# Patient Record
Sex: Female | Born: 1955
Health system: Southern US, Community
[De-identification: ages and names within clinical notes are randomized; demographics above are authoritative.]

## PROBLEM LIST (undated history)

## (undated) DIAGNOSIS — I4891 Unspecified atrial fibrillation: Secondary | ICD-10-CM

## (undated) DIAGNOSIS — E669 Obesity, unspecified: Secondary | ICD-10-CM

## (undated) DIAGNOSIS — J449 Chronic obstructive pulmonary disease, unspecified: Secondary | ICD-10-CM

## (undated) DIAGNOSIS — F329 Major depressive disorder, single episode, unspecified: Secondary | ICD-10-CM

## (undated) DIAGNOSIS — F32A Depression, unspecified: Secondary | ICD-10-CM

## (undated) DIAGNOSIS — I10 Essential (primary) hypertension: Secondary | ICD-10-CM

## (undated) DIAGNOSIS — E785 Hyperlipidemia, unspecified: Secondary | ICD-10-CM

## (undated) DIAGNOSIS — G4700 Insomnia, unspecified: Secondary | ICD-10-CM

## (undated) DIAGNOSIS — F419 Anxiety disorder, unspecified: Secondary | ICD-10-CM

## (undated) DIAGNOSIS — R079 Chest pain, unspecified: Secondary | ICD-10-CM

## (undated) DIAGNOSIS — M1990 Unspecified osteoarthritis, unspecified site: Secondary | ICD-10-CM

## (undated) HISTORY — PX: CHOLECYSTECTOMY: SHX55

## (undated) HISTORY — DX: Major depressive disorder, single episode, unspecified: F32.9

## (undated) HISTORY — DX: Unspecified osteoarthritis, unspecified site: M19.90

## (undated) HISTORY — DX: Anxiety disorder, unspecified: F41.9

## (undated) HISTORY — DX: Essential (primary) hypertension: I10

## (undated) HISTORY — DX: Insomnia, unspecified: G47.00

## (undated) HISTORY — DX: Obesity, unspecified: E66.9

## (undated) HISTORY — PX: CARPAL TUNNEL RELEASE: SHX101

## (undated) HISTORY — DX: Depression, unspecified: F32.A

## (undated) HISTORY — DX: Chest pain, unspecified: R07.9

## (undated) HISTORY — DX: Hyperlipidemia, unspecified: E78.5

---

## 2002-05-19 ENCOUNTER — Other Ambulatory Visit: Admission: RE | Admit: 2002-05-19 | Discharge: 2002-05-19 | Payer: Self-pay | Admitting: *Deleted

## 2002-06-16 ENCOUNTER — Ambulatory Visit (HOSPITAL_BASED_OUTPATIENT_CLINIC_OR_DEPARTMENT_OTHER): Admission: RE | Admit: 2002-06-16 | Discharge: 2002-06-16 | Payer: Self-pay | Admitting: Family Medicine

## 2002-06-29 ENCOUNTER — Ambulatory Visit (HOSPITAL_COMMUNITY): Admission: RE | Admit: 2002-06-29 | Discharge: 2002-06-29 | Payer: Self-pay | Admitting: *Deleted

## 2002-07-01 ENCOUNTER — Ambulatory Visit (HOSPITAL_COMMUNITY): Admission: RE | Admit: 2002-07-01 | Discharge: 2002-07-02 | Payer: Self-pay | Admitting: Cardiology

## 2003-06-09 ENCOUNTER — Ambulatory Visit (HOSPITAL_COMMUNITY): Admission: RE | Admit: 2003-06-09 | Discharge: 2003-06-09 | Payer: Self-pay | Admitting: Family Medicine

## 2006-09-14 ENCOUNTER — Encounter (INDEPENDENT_AMBULATORY_CARE_PROVIDER_SITE_OTHER): Payer: Self-pay | Admitting: Family Medicine

## 2006-09-14 LAB — CONVERTED CEMR LAB
Cholesterol: 206 mg/dL
Triglycerides: 223 mg/dL

## 2006-09-18 ENCOUNTER — Encounter (INDEPENDENT_AMBULATORY_CARE_PROVIDER_SITE_OTHER): Payer: Self-pay | Admitting: Family Medicine

## 2006-09-18 LAB — CONVERTED CEMR LAB: Pap Smear: NORMAL

## 2006-09-19 ENCOUNTER — Ambulatory Visit (HOSPITAL_COMMUNITY): Admission: RE | Admit: 2006-09-19 | Discharge: 2006-09-19 | Payer: Self-pay | Admitting: Family Medicine

## 2007-02-25 ENCOUNTER — Encounter (INDEPENDENT_AMBULATORY_CARE_PROVIDER_SITE_OTHER): Payer: Self-pay | Admitting: Family Medicine

## 2007-03-30 ENCOUNTER — Ambulatory Visit: Payer: Self-pay | Admitting: Family Medicine

## 2007-03-30 DIAGNOSIS — E782 Mixed hyperlipidemia: Secondary | ICD-10-CM | POA: Insufficient documentation

## 2007-03-30 DIAGNOSIS — Z720 Tobacco use: Secondary | ICD-10-CM

## 2007-03-30 DIAGNOSIS — F411 Generalized anxiety disorder: Secondary | ICD-10-CM | POA: Insufficient documentation

## 2007-03-30 HISTORY — DX: Tobacco use: Z72.0

## 2007-04-02 DIAGNOSIS — F3289 Other specified depressive episodes: Secondary | ICD-10-CM

## 2007-04-02 DIAGNOSIS — I10 Essential (primary) hypertension: Secondary | ICD-10-CM | POA: Insufficient documentation

## 2007-04-02 DIAGNOSIS — F329 Major depressive disorder, single episode, unspecified: Secondary | ICD-10-CM

## 2007-04-02 DIAGNOSIS — M1990 Unspecified osteoarthritis, unspecified site: Secondary | ICD-10-CM | POA: Insufficient documentation

## 2007-04-02 DIAGNOSIS — J309 Allergic rhinitis, unspecified: Secondary | ICD-10-CM | POA: Insufficient documentation

## 2007-04-02 HISTORY — DX: Major depressive disorder, single episode, unspecified: F32.9

## 2007-04-02 HISTORY — DX: Other specified depressive episodes: F32.89

## 2007-04-22 ENCOUNTER — Encounter: Payer: Self-pay | Admitting: Family Medicine

## 2007-04-30 ENCOUNTER — Encounter: Payer: Self-pay | Admitting: Sports Medicine

## 2007-04-30 ENCOUNTER — Ambulatory Visit (HOSPITAL_COMMUNITY): Admission: RE | Admit: 2007-04-30 | Discharge: 2007-04-30 | Payer: Self-pay | Admitting: Sports Medicine

## 2007-05-11 ENCOUNTER — Encounter (INDEPENDENT_AMBULATORY_CARE_PROVIDER_SITE_OTHER): Payer: Self-pay | Admitting: Family Medicine

## 2007-05-18 ENCOUNTER — Ambulatory Visit: Payer: Self-pay | Admitting: Family Medicine

## 2007-05-18 DIAGNOSIS — F519 Sleep disorder not due to a substance or known physiological condition, unspecified: Secondary | ICD-10-CM | POA: Insufficient documentation

## 2007-05-22 ENCOUNTER — Encounter (INDEPENDENT_AMBULATORY_CARE_PROVIDER_SITE_OTHER): Payer: Self-pay | Admitting: Family Medicine

## 2007-05-22 ENCOUNTER — Ambulatory Visit: Payer: Self-pay | Admitting: Family Medicine

## 2007-05-22 LAB — CONVERTED CEMR LAB
BUN: 11 mg/dL (ref 6–23)
CO2: 25 meq/L (ref 19–32)
Calcium: 9.6 mg/dL (ref 8.4–10.5)
Creatinine, Ser: 0.49 mg/dL (ref 0.40–1.20)
HDL: 36 mg/dL — ABNORMAL LOW (ref >39)

## 2007-06-01 ENCOUNTER — Encounter (INDEPENDENT_AMBULATORY_CARE_PROVIDER_SITE_OTHER): Payer: Self-pay | Admitting: Family Medicine

## 2007-09-02 ENCOUNTER — Encounter: Payer: Self-pay | Admitting: *Deleted

## 2007-10-08 ENCOUNTER — Encounter: Admission: RE | Admit: 2007-10-08 | Discharge: 2007-10-08 | Payer: Self-pay | Admitting: Family Medicine

## 2007-10-23 ENCOUNTER — Encounter: Payer: Self-pay | Admitting: Family Medicine

## 2007-10-23 ENCOUNTER — Ambulatory Visit: Payer: Self-pay | Admitting: Family Medicine

## 2007-10-28 ENCOUNTER — Encounter (INDEPENDENT_AMBULATORY_CARE_PROVIDER_SITE_OTHER): Payer: Self-pay | Admitting: Family Medicine

## 2007-10-30 ENCOUNTER — Encounter (INDEPENDENT_AMBULATORY_CARE_PROVIDER_SITE_OTHER): Payer: Self-pay | Admitting: Family Medicine

## 2007-10-30 ENCOUNTER — Ambulatory Visit: Payer: Self-pay | Admitting: Family Medicine

## 2007-11-09 LAB — CONVERTED CEMR LAB
Cholesterol: 189 mg/dL (ref 0–200)
HDL: 36 mg/dL — ABNORMAL LOW (ref >39)
Total CHOL/HDL Ratio: 5.3
Triglycerides: 293 mg/dL — ABNORMAL HIGH (ref <150)
VLDL: 59 mg/dL — ABNORMAL HIGH (ref 0–40)

## 2007-11-18 ENCOUNTER — Ambulatory Visit: Payer: Self-pay | Admitting: Family Medicine

## 2007-11-26 ENCOUNTER — Ambulatory Visit: Payer: Self-pay | Admitting: Family Medicine

## 2007-11-26 ENCOUNTER — Telehealth: Payer: Self-pay | Admitting: *Deleted

## 2007-11-26 ENCOUNTER — Encounter: Payer: Self-pay | Admitting: Family Medicine

## 2007-12-02 ENCOUNTER — Ambulatory Visit: Payer: Self-pay | Admitting: Family Medicine

## 2007-12-02 ENCOUNTER — Telehealth: Payer: Self-pay | Admitting: *Deleted

## 2007-12-02 ENCOUNTER — Encounter: Admission: RE | Admit: 2007-12-02 | Discharge: 2007-12-02 | Payer: Self-pay | Admitting: Family Medicine

## 2007-12-02 ENCOUNTER — Encounter: Payer: Self-pay | Admitting: Family Medicine

## 2007-12-03 ENCOUNTER — Telehealth (INDEPENDENT_AMBULATORY_CARE_PROVIDER_SITE_OTHER): Payer: Self-pay | Admitting: *Deleted

## 2007-12-04 ENCOUNTER — Ambulatory Visit: Payer: Self-pay | Admitting: Family Medicine

## 2007-12-09 ENCOUNTER — Encounter: Admission: RE | Admit: 2007-12-09 | Discharge: 2008-01-28 | Payer: Self-pay | Admitting: Family Medicine

## 2007-12-22 ENCOUNTER — Telehealth (INDEPENDENT_AMBULATORY_CARE_PROVIDER_SITE_OTHER): Payer: Self-pay | Admitting: Family Medicine

## 2008-01-11 ENCOUNTER — Ambulatory Visit: Payer: Self-pay | Admitting: Family Medicine

## 2008-01-12 ENCOUNTER — Telehealth: Payer: Self-pay | Admitting: Psychology

## 2008-01-28 ENCOUNTER — Encounter (INDEPENDENT_AMBULATORY_CARE_PROVIDER_SITE_OTHER): Payer: Self-pay | Admitting: Family Medicine

## 2008-02-05 ENCOUNTER — Ambulatory Visit: Payer: Self-pay | Admitting: Family Medicine

## 2008-02-05 ENCOUNTER — Ambulatory Visit: Payer: Self-pay | Admitting: Sports Medicine

## 2008-02-05 ENCOUNTER — Encounter (INDEPENDENT_AMBULATORY_CARE_PROVIDER_SITE_OTHER): Payer: Self-pay | Admitting: Family Medicine

## 2008-02-08 LAB — CONVERTED CEMR LAB
LDL Cholesterol: 102 mg/dL — ABNORMAL HIGH (ref 0–99)
VLDL: 37 mg/dL (ref 0–40)

## 2008-03-04 ENCOUNTER — Ambulatory Visit: Payer: Self-pay | Admitting: Sports Medicine

## 2008-04-07 ENCOUNTER — Telehealth (INDEPENDENT_AMBULATORY_CARE_PROVIDER_SITE_OTHER): Payer: Self-pay | Admitting: Family Medicine

## 2008-06-16 ENCOUNTER — Telehealth: Payer: Self-pay | Admitting: Family Medicine

## 2008-06-16 ENCOUNTER — Ambulatory Visit: Payer: Self-pay | Admitting: Family Medicine

## 2008-06-16 ENCOUNTER — Ambulatory Visit (HOSPITAL_COMMUNITY): Admission: RE | Admit: 2008-06-16 | Discharge: 2008-06-16 | Payer: Self-pay | Admitting: Family Medicine

## 2008-06-16 ENCOUNTER — Encounter: Payer: Self-pay | Admitting: Family Medicine

## 2008-06-16 ENCOUNTER — Telehealth (INDEPENDENT_AMBULATORY_CARE_PROVIDER_SITE_OTHER): Payer: Self-pay | Admitting: Family Medicine

## 2008-06-17 ENCOUNTER — Ambulatory Visit: Payer: Self-pay | Admitting: Family Medicine

## 2008-06-17 LAB — CONVERTED CEMR LAB
BUN: 10 mg/dL (ref 6–23)
Chloride: 103 meq/L (ref 96–112)
Eosinophils Relative: 2 % (ref 0–5)
Glucose, Bld: 94 mg/dL (ref 70–99)
HCT: 45.4 % (ref 36.0–46.0)
Hemoglobin: 15.6 g/dL — ABNORMAL HIGH (ref 12.0–15.0)
Lymphocytes Relative: 28 % (ref 12–46)
Lymphs Abs: 2.3 10*3/uL (ref 0.7–4.0)
Monocytes Absolute: 0.7 10*3/uL (ref 0.1–1.0)
Neutro Abs: 5 10*3/uL (ref 1.7–7.7)
Potassium: 3.7 meq/L (ref 3.5–5.3)
RBC: 4.95 M/uL (ref 3.87–5.11)
Sodium: 142 meq/L (ref 135–145)
TSH: 0.432 microintl units/mL (ref 0.350–4.500)
WBC: 8.2 10*3/uL (ref 4.0–10.5)

## 2008-07-27 ENCOUNTER — Telehealth: Payer: Self-pay | Admitting: *Deleted

## 2008-09-09 ENCOUNTER — Encounter (INDEPENDENT_AMBULATORY_CARE_PROVIDER_SITE_OTHER): Payer: Self-pay | Admitting: Family Medicine

## 2008-11-23 ENCOUNTER — Ambulatory Visit: Payer: Self-pay | Admitting: Family Medicine

## 2008-11-23 ENCOUNTER — Encounter: Payer: Self-pay | Admitting: Family Medicine

## 2008-11-24 ENCOUNTER — Encounter: Payer: Self-pay | Admitting: Family Medicine

## 2008-12-07 ENCOUNTER — Ambulatory Visit: Payer: Self-pay | Admitting: Family Medicine

## 2008-12-07 ENCOUNTER — Encounter: Payer: Self-pay | Admitting: Family Medicine

## 2008-12-07 LAB — CONVERTED CEMR LAB
Cholesterol: 233 mg/dL — ABNORMAL HIGH (ref 0–200)
HDL: 38 mg/dL — ABNORMAL LOW (ref >39)
Total CHOL/HDL Ratio: 6.1
VLDL: 54 mg/dL — ABNORMAL HIGH (ref 0–40)

## 2008-12-08 ENCOUNTER — Encounter: Payer: Self-pay | Admitting: Family Medicine

## 2009-02-20 ENCOUNTER — Telehealth: Payer: Self-pay | Admitting: Family Medicine

## 2009-03-02 ENCOUNTER — Ambulatory Visit: Payer: Self-pay | Admitting: Family Medicine

## 2009-03-16 ENCOUNTER — Encounter: Payer: Self-pay | Admitting: Family Medicine

## 2009-03-29 ENCOUNTER — Encounter: Payer: Self-pay | Admitting: Family Medicine

## 2009-05-28 ENCOUNTER — Emergency Department (HOSPITAL_COMMUNITY): Admission: EM | Admit: 2009-05-28 | Discharge: 2009-05-28 | Payer: Self-pay | Admitting: Family Medicine

## 2009-07-21 ENCOUNTER — Telehealth: Payer: Self-pay | Admitting: Family Medicine

## 2009-07-26 ENCOUNTER — Ambulatory Visit: Payer: Self-pay | Admitting: Family Medicine

## 2009-07-26 DIAGNOSIS — E669 Obesity, unspecified: Secondary | ICD-10-CM | POA: Insufficient documentation

## 2009-07-26 LAB — CONVERTED CEMR LAB
Creatinine,U: 50 mg/dL
Microalbumin U total vol: 30 mg/L

## 2009-07-27 ENCOUNTER — Encounter: Payer: Self-pay | Admitting: Family Medicine

## 2009-07-27 ENCOUNTER — Ambulatory Visit: Payer: Self-pay | Admitting: Family Medicine

## 2009-07-28 ENCOUNTER — Encounter: Payer: Self-pay | Admitting: Family Medicine

## 2009-07-28 LAB — CONVERTED CEMR LAB
AST: 14 units/L (ref 0–37)
Alkaline Phosphatase: 62 units/L (ref 39–117)
BUN: 16 mg/dL (ref 6–23)
Glucose, Bld: 93 mg/dL (ref 70–99)
HDL: 44 mg/dL (ref >39)
Hemoglobin: 14.4 g/dL (ref 12.0–15.0)
LDL Cholesterol: 133 mg/dL — ABNORMAL HIGH (ref 0–99)
MCHC: 33.2 g/dL (ref 30.0–36.0)
MCV: 93.7 fL (ref 78.0–100.0)
Potassium: 3.9 meq/L (ref 3.5–5.3)
RBC: 4.63 M/uL (ref 3.87–5.11)
RDW: 13.8 % (ref 11.5–15.5)
Total Bilirubin: 0.8 mg/dL (ref 0.3–1.2)
Total CHOL/HDL Ratio: 4.8
Triglycerides: 180 mg/dL — ABNORMAL HIGH (ref <150)
VLDL: 36 mg/dL (ref 0–40)

## 2009-11-15 ENCOUNTER — Ambulatory Visit: Payer: Self-pay | Admitting: Family Medicine

## 2009-11-20 ENCOUNTER — Telehealth: Payer: Self-pay | Admitting: Family Medicine

## 2010-01-12 ENCOUNTER — Encounter: Admission: RE | Admit: 2010-01-12 | Discharge: 2010-01-12 | Payer: Self-pay | Admitting: Family Medicine

## 2010-01-16 ENCOUNTER — Ambulatory Visit: Payer: Self-pay | Admitting: Family Medicine

## 2010-01-16 ENCOUNTER — Encounter: Payer: Self-pay | Admitting: Family Medicine

## 2010-02-27 ENCOUNTER — Encounter: Payer: Self-pay | Admitting: Family Medicine

## 2010-02-27 ENCOUNTER — Ambulatory Visit: Payer: Self-pay | Admitting: Family Medicine

## 2010-02-28 ENCOUNTER — Telehealth: Payer: Self-pay | Admitting: *Deleted

## 2010-02-28 LAB — CONVERTED CEMR LAB: Direct LDL: 149 mg/dL — ABNORMAL HIGH

## 2010-03-20 ENCOUNTER — Encounter: Payer: Self-pay | Admitting: Family Medicine

## 2010-04-24 NOTE — Assessment & Plan Note (Signed)
Summary: leg & foot pain,tcb   Vital Signs:  Patient profile:   55 year old female Height:      60.25 inches Weight:      183.5 pounds BMI:     35.67 Pulse rate:   67 / minute BP sitting:   130 / 80  (left arm) Cuff size:   large  Vitals Entered By: Arlyss Repress CMA, (November 15, 2009 10:30 AM) CC: chronic bilateral knee ... getting worse x several months.  Is Patient Diabetic? No Pain Assessment Patient in pain? yes     Location: knees Intensity: 6 Onset of pain  Chronic   Primary Care Provider:  Milinda Antis MD  CC:  chronic bilateral knee ... getting worse x several months. .  History of Present Illness:     Bilateral knee pain and feet pain- worse after long hours 5-6 hours, using insoles prior feel like the didn't help as much, has new shoes at home which hurt after wearing x 1 day now with knee pain, no swelling, no locking, occ giving out , hands hurt as well after working - known arrthritis in hands Rest relieves pain Tried massage, taking Ibuprofen 600mg   helps take the edge off    Exercise- Swimming, walks the dog  Feels she was better when she was smoking       Habits & Providers  Alcohol-Tobacco-Diet     Tobacco Status: quit < 6 months     Tobacco Counseling: to remain off tobacco products  Current Medications (verified): 1)  Hydrochlorothiazide 25 Mg  Tabs (Hydrochlorothiazide) .... Take 1 Tab By Mouth Every Morning 2)  Fish Oil 1000 Mg Caps (Omega-3 Fatty Acids) .... 4 By Mouth Daily 3)  Ambien 5 Mg Tabs (Zolpidem Tartrate) .Marland Kitchen.. 1 By Mouth At Bedtime As Needed Insomnia 4)  Zyrtec Allergy 10 Mg Tabs (Cetirizine Hcl) .Marland Kitchen.. 1 By Mouth Daily As Needed Allergies 5)  Glucosamine 500 Mg Caps (Glucosamine Sulfate) .... 2 Tabs A Day 6)  Aspir-Low 81 Mg Tbec (Aspirin) .Marland Kitchen.. 1 By Mouth Daily 7)  Multivitamins  Caps (Multiple Vitamin) .Marland Kitchen.. 1 Tab Daily 8)  Ibuprofen 600 Mg Tabs (Ibuprofen) .Marland Kitchen.. 1 By Mouth Q 6hrs With Food, As Needed Pain  Allergies  (verified): No Known Drug Allergies  Social History: Smoking Status:  quit < 6 months  Physical Exam  General:  NAD, Vital signs noted  Msk:  Bilat feet  No visible erythema or swelling. Range of motion is full in all directions. Strength is 5/5 in all directions. Stable lateral and medial ligaments; squeeze test  unremarkable; Talar dome nontender; No tenderness on posterior aspects of lateral and medial malleolus TTP over bialt soles of feet     Bilat knee- no effusion noted, no erythema, NT to palpation, ligamients intact, neg lachmans and anterior drawer, patellar non tender, strength equal bilat   Impression & Recommendations:  Problem # 1:  OSTEOARTHRITIS (ICD-715.90) Assessment Unchanged  Knee pain likley secondary to early arthritis and referred pain from feet. Continue NSAIDS as needed, no imaging needed today insoles for feet, exercises with tennis ball , elevation after work Her updated medication list for this problem includes:    Aspir-low 81 Mg Tbec (Aspirin) .Marland Kitchen... 1 by mouth daily    Ibuprofen 600 Mg Tabs (Ibuprofen) .Marland Kitchen... 1 by mouth q 6hrs with food, as needed pain  Orders: [facility]- Est Level  3 (04540)  Problem # 2:  FOOT PAIN, BILATERAL (ICD-729.5) Assessment: New  no red flags, ?  plantar fascitis, vs poor arch support, reccomded SM for new pair of orthotics or insoles  Orders: [facility]- Est Level  3 (02725)  Complete Medication List: 1)  Hydrochlorothiazide 25 Mg Tabs (Hydrochlorothiazide) .... Take 1 tab by mouth every morning 2)  Fish Oil 1000 Mg Caps (Omega-3 fatty acids) .... 4 by mouth daily 3)  Ambien 5 Mg Tabs (Zolpidem tartrate) .Marland Kitchen.. 1 by mouth at bedtime as needed insomnia 4)  Zyrtec Allergy 10 Mg Tabs (Cetirizine hcl) .Marland Kitchen.. 1 by mouth daily as needed allergies 5)  Glucosamine 500 Mg Caps (Glucosamine sulfate) .... 2 tabs a day 6)  Aspir-low 81 Mg Tbec (Aspirin) .Marland Kitchen.. 1 by mouth daily 7)  Multivitamins Caps (Multiple vitamin) .Marland Kitchen.. 1 tab daily 8)   Ibuprofen 600 Mg Tabs (Ibuprofen) .Marland Kitchen.. 1 by mouth q 6hrs with food, as needed pain  Patient Instructions: 1)  I recommend getting some insoles for your shoes 2)  Use at tennis ball and roll under your feet 3)  Continue ibuprofen for now for your knees 4)  If your knees get worse,pelase let me know  5)  Follow-up in 2 months for physical Prescriptions: IBUPROFEN 600 MG TABS (IBUPROFEN) 1 by mouth q 6hrs with food, as needed pain  #60 x 0   Entered and Authorized by:   Milinda Antis MD   Signed by:   Milinda Antis MD on 11/15/2009   Method used:   Electronically to        [facility] [phone]* (retail)       [address]       [place], [place]  [address]       Ph: [phone]       Fax: [phone]   RxID:   [id number]

## 2010-04-24 NOTE — Progress Notes (Signed)
Summary: RX  Phone Note Refill Request Call back at Home Phone [phone]   Refills Requested: Medication #1:  AMBIEN 5 MG TABS 1 by mouth at bedtime as needed insomnia pt goes to [facility]  Initial call taken by: Knox Royalty,  July 21, 2009 10:42 AM    Prescriptions: AMBIEN 5 MG TABS (ZOLPIDEM TARTRATE) 1 by mouth at bedtime as needed insomnia  #30 x 1   Entered and Authorized by:   Milinda Antis MD   Signed by:   Milinda Antis MD on 07/21/2009   Method used:   Telephoned to ...       [facility] [address] [phone]* (retail)       [address]       [place], [place]  [address]       Ph: [phone]       Fax: [phone]   RxID:   [id number]

## 2010-04-24 NOTE — Assessment & Plan Note (Signed)
Summary: cpe,df   Vital Signs:  Patient profile:   55 year old female Height:      60 inches Weight:      188.6 pounds BMI:     36.97 Pulse rate:   72 / minute BP sitting:   132 / 82  (left arm) Cuff size:   regular  Vitals Entered By: Arlyss Repress CMA, (January 16, 2010 3:22 PM) CC: physical./ f/u meds Is Patient Diabetic? No Pain Assessment Patient in pain? no        Primary Care Provider:  Milinda Antis MD  CC:  physical./ f/u meds.  History of Present Illness:   Pain- Continues to have pain in legs and feet after working long shits, now using Dr.Scholl , not taking Tylenol or ibuprofen, keeps them with her for pain relief  Now has a new dog she rescued so walking more , no falling , no legs weakness    HTN- toleating BP meds, no CP, no HA  Insomnia-- using Ambien every night, tried to to not use it but needs, TV and radio off, tries to get to bed same time each night, tried relaxation techniques , CD's but relies on meds  Prevention- Need Colonscooy report , given the previous physician offfice number PAP smear normal 2008, 2009, 2010 next one due in 2012  Mammogram normal    Quit tobacco in April 2011  Habits & Providers  Alcohol-Tobacco-Diet     Tobacco Status: quit > 6 months     Tobacco Counseling: to remain off tobacco products  Current Medications (verified): 1)  Hydrochlorothiazide 25 Mg  Tabs (Hydrochlorothiazide) .... Take 1 Tab By Mouth Every Morning 2)  Fish Oil 1000 Mg Caps (Omega-3 Fatty Acids) .... 4 By Mouth Daily 3)  Ambien 5 Mg Tabs (Zolpidem Tartrate) .Marland Kitchen.. 1 By Mouth At Bedtime As Needed Insomnia 4)  Zyrtec Allergy 10 Mg Tabs (Cetirizine Hcl) .Marland Kitchen.. 1 By Mouth Daily As Needed Allergies 5)  Glucosamine 500 Mg Caps (Glucosamine Sulfate) .... 2 Tabs A Day 6)  Aspir-Low 81 Mg Tbec (Aspirin) .Marland Kitchen.. 1 By Mouth Daily 7)  Multivitamins  Caps (Multiple Vitamin) .Marland Kitchen.. 1 Tab Daily 8)  Ibuprofen 600 Mg Tabs (Ibuprofen) .Marland Kitchen.. 1 By Mouth Q 6hrs With Food,  As Needed Pain  Allergies (verified): No Known Drug Allergies  Past History:  Past Medical History: Allergic rhinitis Anxiety Depression Hypertension Osteoarthritis sleep apnea Quit tobacco in April 2011  Social History: Smoking Status:  quit > 6 months  Review of Systems       Per HPI  Physical Exam  General:  NAD, Vital signs noted  Eyes:  Wears glasses , PERRL, EOMI, fundoscopic exam difficult, no papilledema  Lungs:  CTAB Heart:  RRR,NO murmur Pulses:  2+ DP,radial Extremities:  No edema   Impression & Recommendations:  Problem # 1:  HYPERTENSION (ICD-401.9) Assessment Unchanged  no change to meds, next labs in May Her updated medication list for this problem includes:    Hydrochlorothiazide 25 Mg Tabs (Hydrochlorothiazide) .Marland Kitchen... Take 1 tab by mouth every morning  Orders: [facility]- Est  Level 4 ([address])  Problem # 2:  FOOT PAIN, BILATERAL (ICD-729.5) Assessment: Improved  Improved with inserts, pain in legs and feet likley from overuse, may have some early arthritis Tylenol as needed has been evaluate for this previous, encouraged water aerobics  Orders: [facility]- Est  Level 4 ([address])  Problem # 3:  INSOMNIA-SLEEP DISORDER-UNSPEC (ICD-307.40) Assessment: Unchanged  Discussed possible change to Trazodone, doing well  on Ambien low dose. Continue   Orders: [facility]- Est  Level 4 ([address])  Problem # 4:  HYPERLIPIDEMIA (ICD-272.2) Assessment: Unchanged  Recheck in 1-2 months Fish oil Discussed may need Statin  Orders: [facility]- Est  Level 4 ([address])  Problem # 5:  TOBACCO USE, QUIT (ICD-V15.82) Assessment: Improved  Orders: [facility]- Est  Level 4 ([address]11)  Problem # 6:  Preventive Health Care (ICD-V70.0) Screening UTD , mammogram, neg PAP reheck in 2012 Colonoscopy records sent Flu shot given  Complete Medication List: 1)  Hydrochlorothiazide 25 Mg Tabs (Hydrochlorothiazide) .... Take 1 tab by mouth every morning 2)  Fish Oil 1000 Mg Caps (Omega-3 fatty acids)  .... 4 by mouth daily 3)  Ambien 5 Mg Tabs (Zolpidem tartrate) .Marland Kitchen.. 1 by mouth at bedtime as needed insomnia 4)  Zyrtec Allergy 10 Mg Tabs (Cetirizine hcl) .Marland Kitchen.. 1 by mouth daily as needed allergies 5)  Glucosamine 500 Mg Caps (Glucosamine sulfate) .... 2 tabs a day 6)  Aspir-low 81 Mg Tbec (Aspirin) .Marland Kitchen.. 1 by mouth daily 7)  Multivitamins Caps (Multiple vitamin) .Marland Kitchen.. 1 tab daily 8)  Ibuprofen 600 Mg Tabs (Ibuprofen) .Marland Kitchen.. 1 by mouth q 6hrs with food, as needed pain  Other Orders: Influenza Vaccine NON MCR (91478)  Patient Instructions: 1)  Water aerboics would be great for your joints 2)  Taking the acetaminophen  3)  Make an appt with the Lab in Nov/Dec do not eat afternight - To check your cholesterol  4)  Next visit in April/May for your PAP Smear and yearly labs  Prescriptions: HYDROCHLOROTHIAZIDE 25 MG  TABS (HYDROCHLOROTHIAZIDE) Take 1 tab by mouth every morning  #30.0 Each x 6   Entered and Authorized by:   Milinda Antis MD   Signed by:   Milinda Antis MD on 01/16/2010   Method used:   Electronically to        [facility] [phone]* (retail)       [address]       [place], [place]  [address]       Ph: [phone]       Fax: [phone]   RxID:   [id number] AMBIEN 5 MG TABS (ZOLPIDEM TARTRATE) 1 by mouth at bedtime as needed insomnia  #30 x 2   Entered and Authorized by:   Milinda Antis MD   Signed by:   Milinda Antis MD on 01/16/2010   Method used:   Handwritten   RxID:   [id number]    Orders Added: 1)  Influenza Vaccine NON MCR [00028] 2)  [facility]- Est  Level 4 [18841]   Immunizations Administered:  Influenza Vaccine # 1:    Vaccine Type: Fluvax Non-MCR    Site: left deltoid    Mfr: GlaxoSmithKline    Dose: 0.5 ml    Route: IM    Given by: Tessie Fass CMA    Exp. Date: 09/19/2010    Lot #: [id number]    VIS given: 10/17/09 version given January 16, 2010.  Flu Vaccine Consent Questions:    Do you have a history of severe allergic  reactions to this vaccine? no    Any prior history of allergic reactions to egg and/or gelatin? no    Do you have a sensitivity to the preservative Thimersol? no    Do you have a past history of Guillan-Barre Syndrome? no    Do you currently have an acute febrile illness? no    Have you ever had a severe reaction to latex? no  Vaccine information given and explained to patient? yes    Are you currently pregnant? no   Immunizations Administered:  Influenza Vaccine # 1:    Vaccine Type: Fluvax Non-MCR    Site: left deltoid    Mfr: GlaxoSmithKline    Dose: 0.5 ml    Route: IM    Given by: Tessie Fass CMA    Exp. Date: 09/19/2010    Lot #: [id number]    VIS given: 10/17/09 version given January 16, 2010.    Prevention & Chronic Care Immunizations   Influenza vaccine: Fluvax Non-MCR  (01/16/2010)   Influenza vaccine due: 03/02/2010    Tetanus booster: Not documented    Pneumococcal vaccine: Not documented  Colorectal Screening   Hemoccult: Not documented   Hemoccult action/deferral: Ordered  (11/23/2008)   Hemoccult due: Not Indicated    Colonoscopy: Not documented  Other Screening   Pap smear: NEGATIVE FOR INTRAEPITHELIAL LESIONS OR MALIGNANCY.  (11/23/2008)   Pap smear due: 07/27/2010    Mammogram: ASSESSMENT: Negative - BI-RADS 1^MM DIGITAL SCREENING  (01/12/2010)   Mammogram due: 01/13/2011   Smoking status: quit > 6 months  (01/16/2010)  Lipids   Total Cholesterol: 213  (07/27/2009)   LDL: 133  (07/27/2009)   LDL Direct: Not documented   HDL: 44  (07/27/2009)   Triglycerides: 180  (07/27/2009)    SGOT (AST): 14  (07/27/2009)   SGPT (ALT): 16  (07/27/2009)   Alkaline phosphatase: 62  (07/27/2009)   Total bilirubin: 0.8  (07/27/2009)    Lipid flowsheet reviewed?: Yes   Progress toward LDL goal: Unchanged  Hypertension   Last Blood Pressure: 132 / 82  (01/16/2010)   Serum creatinine: 0.57  (07/27/2009)   Serum potassium 3.9  (07/27/2009)     Hypertension flowsheet reviewed?: Yes   Progress toward BP goal: At goal  Self-Management Support :   Personal Goals (by the next clinic visit) :      Personal blood pressure goal: 140/90  (01/16/2010)     Personal LDL goal: 130  (01/16/2010)    Hypertension self-management support: Not documented    Lipid self-management support: Not documented     Flex Sig Next Due:  Not Indicated Hemoccult Next Due:  Not Indicated Last Mammogram:  ASSESSMENT: Negative - BI-RADS 1^MM DIGITAL SCREENING (01/12/2010 12:10:00 PM) Mammogram Next Due:  1 yr

## 2010-04-24 NOTE — Consult Note (Signed)
Summary: [facility]  [facility]   Imported By: Clydell Hakim 04/06/2009 11:47:15  _____________________________________________________________________  External Attachment:    Type:   Image     Comment:   External Document

## 2010-04-24 NOTE — Miscellaneous (Signed)
Summary: Orders Update  Clinical Lists Changes  Orders: Added new Test order of Lipid-[facility] (80061-22930) - Signed 

## 2010-04-24 NOTE — Assessment & Plan Note (Signed)
Summary: f/u visit/bmc   Vital Signs:  Patient profile:   55 year old female Height:      60.25 inches Weight:      176 pounds BMI:     34.21 Temp:     98.7 degrees F oral Pulse rate:   60 / minute BP sitting:   107 / 72  (left arm) Cuff size:   large  Vitals Entered By: Tessie Fass CMA (Jul 26, 2009 11:41 AM) CC: F/U dizzy, HTN, left side pain Is Patient Diabetic? No Pain Assessment Patient in pain? no          Last PAP Date 07/26/2009 Last PAP Result normal   Primary Care Provider:  Milinda Antis MD  CC:  F/U dizzy, HTN, and left side pain.  History of Present Illness:   1. Dizziness- Feels dizzy on and off for past 6 months, occurs mostly at work, such as when she is looking around at work or multitasking, no LOC ,. Noticed first epsidoe  6 months  ago, denies vertigo or room spinning, feels more like she is on a boat. No OTC meds taken in past or recently.  No associated headache, seen by optometrist last Year no change in prescription Pt does feel like it is worse since she  quit smoking  ROS- no chest pain, SOB, + Nausea no emesis, no illness during episodes   2. Left side pain-  admits to cramping pain in LLQ  every now and then that last a few days then stops. Pain is intermittant during the days it comes, non radiating no aggravating or alleviating factors. No recent epsiode. Denies fever, vaginal bleeding, discharge, dysuria or constipation with episodes  Pt also asked about her weight loss, down 12lbs since Sep 2010, with no specific changes  Had colonscopy, PAP smear, no mammogram done  3. HTN- tolerating medications ,no side effects, no home bp monitoring    Habits & Providers  Alcohol-Tobacco-Diet     Tobacco Status: quit  Current Medications (verified): 1)  Hydrochlorothiazide 25 Mg  Tabs (Hydrochlorothiazide) .... Take 1 Tab By Mouth Every Morning 2)  Fish Oil 1000 Mg Caps (Omega-3 Fatty Acids) .... 4 By Mouth Daily 3)  Ambien 5 Mg Tabs  (Zolpidem Tartrate) .Marland Kitchen.. 1 By Mouth At Bedtime As Needed Insomnia 4)  Zyrtec Allergy 10 Mg Tabs (Cetirizine Hcl) .Marland Kitchen.. 1 By Mouth Daily As Needed Allergies 5)  Glucosamine 500 Mg Caps (Glucosamine Sulfate) .... 2 Tabs A Day 6)  Aspir-Low 81 Mg Tbec (Aspirin) .Marland Kitchen.. 1 By Mouth Daily 7)  Multivitamins  Caps (Multiple Vitamin) .Marland Kitchen.. 1 Tab Daily 8)  Antivert 25 Mg Tabs (Meclizine Hcl) .... Take 1 Tab Two Times A Day As Needed Dizziness  Allergies (verified): No Known Drug Allergies  Social History: Smoking Status:  quit  Physical Exam  General:  NAD, Vital signs noted Able to evoke dizziness and off balance by laying pt backward and moving her right and left, lasted approx 30 sec Eyes:  Wears glasses , PERRL, EOMI Ears:  External ear exam shows no significant lesions or deformities.  Otoscopic examination reveals clear canals, tympanic membranes are intact bilaterally without bulging, retraction, inflammation or discharge.  Mouth:  Oral mucosa and oropharynx without lesions or exudates.   Neck:  Supple, no masses felt No carotid Bruit Lungs:  CTAB Heart:  RRR, HR 60's, no murmur Abdomen:  soft, non-tender, normal bowel sounds, no distention, and no masses.   Pulses:  2+ DP, and  PT, Radial Extremities:  No edema Neurologic:  Strength equal bilat, CN II-Xii in tact, no focal deficits   Impression & Recommendations:  Problem # 1:  DIZZINESS (ICD-780.4) Assessment New  No evidence of infection, vascular disease in carotids, arrythmia, no new meds, bp on low normal but prior BP in the  120's on same dose of HCTZ. Will send for vestibular training, meclizine prn Her updated medication list for this problem includes:    Zyrtec Allergy 10 Mg Tabs (Cetirizine hcl) .Marland Kitchen... 1 by mouth daily as needed allergies    Antivert 25 Mg Tabs (Meclizine hcl) .Marland Kitchen... Take 1 tab two times a day as needed dizziness  Orders: [facility]- Est  Level 4 ([address])  Problem # 2:  ABDOMINAL PAIN, LEFT LOWER QUADRANT  (ICD-789.04) Assessment: New  No red flags, pt menopausal , no change in bowels during episodes and no specific pattern will monitor for now.  Orders: [facility]- Est  Level 4 ([address]14[address])  Problem # 3:  WEIGHT LOSS (ICD-783.21) Assessment: New  Unprovoked weight loss, will obtain Colonscopy report, other cancer screening to be done, such as Mammogram, PAP smear neg. If persistant CXR at next visit, labs to be done in AM , CMET, CBC, FLP, A1C  Orders: [facility]- Est  Level 4 ([address])  Problem # 4:  HYPERTENSION (ICD-401.9) Assessment: Unchanged  Her updated medication list for this problem includes:    Hydrochlorothiazide 25 Mg Tabs (Hydrochlorothiazide) .Marland Kitchen... Take 1 tab by mouth every morning  Orders: UA Microalbumin-[facility] (29562)  Complete Medication List: 1)  Hydrochlorothiazide 25 Mg Tabs (Hydrochlorothiazide) .... Take 1 tab by mouth every morning 2)  Fish Oil 1000 Mg Caps (Omega-3 fatty acids) .... 4 by mouth daily 3)  Ambien 5 Mg Tabs (Zolpidem tartrate) .Marland Kitchen.. 1 by mouth at bedtime as needed insomnia 4)  Zyrtec Allergy 10 Mg Tabs (Cetirizine hcl) .Marland Kitchen.. 1 by mouth daily as needed allergies 5)  Glucosamine 500 Mg Caps (Glucosamine sulfate) .... 2 tabs a day 6)  Aspir-low 81 Mg Tbec (Aspirin) .Marland Kitchen.. 1 by mouth daily 7)  Multivitamins Caps (Multiple vitamin) .Marland Kitchen.. 1 tab daily 8)  Antivert 25 Mg Tabs (Meclizine hcl) .... Take 1 tab two times a day as needed dizziness  Other Orders: Future Orders: Comp Met-[facility] ([id number]) ... 07/27/2010 CBC-[facility] (96295) ... 07/27/2010 Lipid-[facility] ([id number]) ... 08/10/2010 A1C-[facility] (02725) ... 08/03/2010  Patient Instructions: 1)  Please return to have your labs drawn 2)  Try the meclizine for dizziness 3)  I will send you to Vestibular Physical therapy to help your dizziness 4)  Congratulations on the smoking 5)  Please call me with the name of the practice where you received your colonoscopy 6)  please schedule your  mammogram Prescriptions: HYDROCHLOROTHIAZIDE 25 MG  TABS (HYDROCHLOROTHIAZIDE) Take 1 tab by mouth every morning  #30.0 Each x 3   Entered and Authorized by:   Milinda Antis MD   Signed by:   Milinda Antis MD on 07/26/2009   Method used:   Electronically to        [facility] [phone]* (retail)       [address]       [place], [place]  [address]       Ph: [phone]       Fax: [phone]   RxID:   [id number] ANTIVERT 25 MG TABS (MECLIZINE HCL) tAKE 1 TAB two times a day as needed dizziness  #60 x 1   Entered and Authorized by:   Milinda Antis MD  Signed by:   Milinda Antis MD on 07/26/2009   Method used:   Electronically to        [facility] [phone]* (retail)       [address]       [place], [place]  [address]       Ph: [phone]       Fax: [phone]   RxID:   [id number]   Laboratory Results   Urine Tests  Date/Time Received: Jul 26, 2009 11:47 AM  Date/Time Reported: Jul 26, 2009 11:57 AM   Microalbumin (urine): 30 mg/L Creatinine: 50mg /dL  A:C Ratio 24-401 Abnormal Comments: ...........test performed by...........Marland KitchenTerese Door, CMA     Last PAP:  NEGATIVE FOR INTRAEPITHELIAL LESIONS OR MALIGNANCY. (11/23/2008 12:00:00 AM) PAP Result Date:  07/26/2009 PAP Result:  normal PAP Next Due:  1 yr

## 2010-04-24 NOTE — Letter (Signed)
Summary: Lab-Female  All     ,     Phone:   Fax:     07/28/2009        [facility] [address]  [place], [place]  [address]   Dear Ms. SMITH:  We have carefully reviewed the results of your tests noted below and the results are:    Cholesterol: 213 on 07/27/2009   Goal < 200   HDL "good" Cholesterol: 44 on 07/27/2009 Goal > 35   LDL "bad" Cholesterol: 133  on 07/27/2009  Goal < 130   Triglycerides: 180   Goal < 150       Other Lab work: Your complete blood count was normal you are not anemia, your complete metabolic count which looks at electrolytes, kidney function and liver function was normal     Special recommendations: Continue to watch the fatty foods and fried foods. At this time continue your fish oil, we will not start a cholesterol lowering medication at this time.  Please make a note to return in 6 months to have your cholesterol done again.  Please call me with the office that did your Colonoscopy.  If you have any questions, please call. We appreciate being able to work with you.   Sincerely,   Milinda Antis MD Typed by: Milinda Antis MD  Appended Document: Lab-Female mailed.

## 2010-04-24 NOTE — Progress Notes (Signed)
Summary: refill  Phone Note Refill Request Call back at Home Phone [phone] Message from:  Patient  Refills Requested: Medication #1:  AMBIEN 5 MG TABS 1 by mouth at bedtime as needed insomnia [facility] [address]  Initial call taken by: De Nurse,  November 20, 2009 11:08 AM    Prescriptions: AMBIEN 5 MG TABS (ZOLPIDEM TARTRATE) 1 by mouth at bedtime as needed insomnia  #30 x 1   Entered and Authorized by:   Milinda Antis MD   Signed by:   Milinda Antis MD on 11/20/2009   Method used:   Telephoned to ...       [facility] [address] [phone]* (retail)       [address]       [place], [place]  [address]       Ph: [phone]       Fax: [phone]   RxID:   [id number]

## 2010-04-24 NOTE — Progress Notes (Signed)
Summary: Lab results- Start Simvastatin  Phone Note Outgoing Call   Call placed by: Milinda Antis MD,  February 28, 2010 8:54 AM Details for Reason: LDL results Summary of Call: LDL above goal, continues to increase now at 149, goal < 130, on fish oil but needs statin, discussed this at last vist. Message given to patient Mother for pt to return call Simvasatin 20mg  sent to [facility], pt need to let me know if she has any muscle aches or cramping after starting the medication  Follow-up for Phone Call        called pt and informed. Follow-up by: Arlyss Repress CMA,,  February 28, 2010 3:06 PM

## 2010-04-25 ENCOUNTER — Telehealth: Payer: Self-pay | Admitting: Family Medicine

## 2010-04-26 NOTE — Miscellaneous (Signed)
  Clinical Lists Changes  Problems: Removed problem of FOOT PAIN, BILATERAL (ICD-729.5) Removed problem of PES PLANUS (ICD-734) Removed problem of DECREASED HEARING (ICD-389.9) Observations: Added new observation of PAST MED HX: Allergic rhinitis Anxiety Depression Hypertension Osteoarthritis sleep apnea Quit tobacco in April 2011 Bilat foot pain-pes planus Decreased Hearing (03/20/2010 23:51)      Past Medical History:    Allergic rhinitis    Anxiety    Depression    Hypertension    Osteoarthritis    sleep apnea    Quit tobacco in April 2011    Bilat foot pain-pes planus    Decreased Hearing

## 2010-05-02 NOTE — Progress Notes (Signed)
Summary: refill- called in   Phone Note Refill Request Call back at Home Phone [phone] Message from:  Patient  Refills Requested: Medication #1:  AMBIEN 5 MG TABS 1 by mouth at bedtime as needed insomnia pt has 1 left - pharm states they have been trying to contact us about this [facility]  Initial call taken by: De Nurse,  April 25, 2010 3:49 PM    Prescriptions: AMBIEN 5 MG TABS (ZOLPIDEM TARTRATE) 1 by mouth at bedtime as needed insomnia  #30 x 2   Entered and Authorized by:   Milinda Antis MD   Signed by:   Milinda Antis MD on 04/25/2010   Method used:   Telephoned to ...       [facility] [address] [phone]* (retail)       [address]       [place], [place]  [address]       Ph: [phone]       Fax: [phone]   RxID:   [id number]

## 2010-05-24 ENCOUNTER — Encounter: Payer: Self-pay | Admitting: Family Medicine

## 2010-05-24 ENCOUNTER — Ambulatory Visit (INDEPENDENT_AMBULATORY_CARE_PROVIDER_SITE_OTHER): Payer: BC Managed Care – PPO | Admitting: Family Medicine

## 2010-05-24 VITALS — BP 135/83 | HR 71 | Temp 98.8°F | Ht 59.84 in | Wt 195.0 lb

## 2010-05-24 DIAGNOSIS — R1011 Right upper quadrant pain: Secondary | ICD-10-CM

## 2010-05-24 LAB — CONVERTED CEMR LAB
CO2: 27 meq/L (ref 19–32)
Creatinine, Ser: 0.6 mg/dL (ref 0.40–1.20)
Eosinophils Absolute: 1 10*3/uL — ABNORMAL HIGH (ref 0.0–0.7)
Eosinophils Relative: 12 % — ABNORMAL HIGH (ref 0–5)
Glucose, Bld: 113 mg/dL — ABNORMAL HIGH (ref 70–99)
HCT: 42.4 % (ref 36.0–46.0)
Hemoglobin: 14.2 g/dL (ref 12.0–15.0)
Lipase: 58 units/L (ref 0–75)
Lymphocytes Relative: 22 % (ref 12–46)
Lymphs Abs: 1.7 10*3/uL (ref 0.7–4.0)
MCV: 91.4 fL (ref 78.0–100.0)
Monocytes Absolute: 0.6 10*3/uL (ref 0.1–1.0)
RDW: 13.6 % (ref 11.5–15.5)
Total Bilirubin: 0.9 mg/dL (ref 0.3–1.2)
WBC: 7.8 10*3/uL (ref 4.0–10.5)

## 2010-05-24 LAB — COMPREHENSIVE METABOLIC PANEL
AST: 18 U/L (ref 0–37)
Albumin: 4.2 g/dL (ref 3.5–5.2)
Alkaline Phosphatase: 70 U/L (ref 39–117)
BUN: 11 mg/dL (ref 6–23)
Creat: 0.6 mg/dL (ref 0.40–1.20)
Glucose, Bld: 113 mg/dL — ABNORMAL HIGH (ref 70–99)
Total Bilirubin: 0.9 mg/dL (ref 0.3–1.2)

## 2010-05-24 LAB — CBC WITH DIFFERENTIAL/PLATELET
Eosinophils Absolute: 1 10*3/uL — ABNORMAL HIGH (ref 0.0–0.7)
HCT: 42.4 % (ref 36.0–46.0)
Hemoglobin: 14.2 g/dL (ref 12.0–15.0)
Lymphs Abs: 1.7 10*3/uL (ref 0.7–4.0)
MCH: 30.6 pg (ref 26.0–34.0)
MCHC: 33.5 g/dL (ref 30.0–36.0)
Monocytes Absolute: 0.6 10*3/uL (ref 0.1–1.0)
Monocytes Relative: 8 % (ref 3–12)
Neutrophils Relative %: 57 % (ref 43–77)
RBC: 4.64 MIL/uL (ref 3.87–5.11)

## 2010-05-24 LAB — LIPASE: Lipase: 58 U/L (ref 0–75)

## 2010-05-24 MED ORDER — ZOLPIDEM TARTRATE 5 MG PO TABS: 5.0000 mg | ORAL_TABLET | Freq: Every evening | ORAL | Status: DC | PRN

## 2010-05-24 MED ORDER — RANITIDINE HCL 150 MG PO TABS: 150.0000 mg | ORAL_TABLET | Freq: Every day | ORAL | Status: DC

## 2010-05-24 NOTE — Progress Notes (Signed)
  Subjective:    Patient ID: Sharon Cain, female    DOB: [birth year], 55 y.o.   MRN: [id number]  HPI   RUQ pain x 1 week, radiated to epigastrium, +nausea after eating any foods occ smelling foods,, feels like a griping pain and as the day goes on it becomes it becomes worse Ibuprofen helps the pain, more tolearable , +bloating, +gas, denies constipation Denies any injury at work to back, but has been lifting some heavy boxes    Review of Systems no emesis, no dysuria, no diarrhea, no blood in stools, no fever,, no URI symptoms           Objective:   Physical Exam      GEN- NAD, alert and oriented, vitals noted       CVS-RRR      Resp- CTAB, no wheeze, no rhonchi     BACK- lumbar region non tender, no CVA tenderness      ABD- soft, NABS, bloated appearing, no hepatomegaly, mild TTP in epigastrium and right side wall, no rebound, no gaurding, no masses felt, neg rosvings       Assessment & Plan:

## 2010-05-24 NOTE — Assessment & Plan Note (Addendum)
Unclear cause of acute abdominal pain, s/p cholecystecomy, though griping pain and nausea suggestive of gallbladder etiology or spasm, other differentials- Gastritis/gerd, pancreatitis,  less like pulmonary, or constipation ? MSK  Will obtain CMET/CBC/lipase , start H2 blocker, given red flags, will f/u with pt and review labs prior to any imaging Discussed foods to avoid with gas, increasing water intake

## 2010-05-24 NOTE — Patient Instructions (Signed)
Return if your abdominal pain does not improve over the weekend. Start the acid blocker If you notice fever, change in your stools such as blood please return or go to ER I will call you with lab results

## 2010-05-25 ENCOUNTER — Telehealth: Payer: Self-pay | Admitting: *Deleted

## 2010-05-25 NOTE — Telephone Encounter (Signed)
Called pt and informed of Dr.[place]'s message. Pt verbalized understanding Sharon Cain

## 2010-07-02 ENCOUNTER — Other Ambulatory Visit: Payer: Self-pay | Admitting: Family Medicine

## 2010-07-02 MED ORDER — SIMVASTATIN 20 MG PO TABS: 20.0000 mg | ORAL_TABLET | Freq: Every day | ORAL | Status: DC

## 2010-07-09 ENCOUNTER — Telehealth: Payer: Self-pay | Admitting: Family Medicine

## 2010-07-09 ENCOUNTER — Encounter: Payer: Self-pay | Admitting: Family Medicine

## 2010-07-09 NOTE — Telephone Encounter (Signed)
Need a note from provider stating that pt be allowed to have something to drink at her workstation due to pt having dry mouth from meds she's taking.

## 2010-07-11 ENCOUNTER — Encounter: Payer: Self-pay | Admitting: Family Medicine

## 2010-07-25 ENCOUNTER — Encounter: Payer: Self-pay | Admitting: Family Medicine

## 2010-07-31 ENCOUNTER — Other Ambulatory Visit: Payer: Self-pay | Admitting: Family Medicine

## 2010-07-31 NOTE — Telephone Encounter (Signed)
Refill request

## 2010-08-07 ENCOUNTER — Other Ambulatory Visit: Payer: Self-pay | Admitting: Family Medicine

## 2010-08-07 NOTE — Telephone Encounter (Signed)
Refill request

## 2010-08-10 NOTE — Discharge Summary (Signed)
NAME:  [name], [name] NO.:  [id number]   MEDICAL RECORD NO.:  [id number]                   PATIENT TYPE:  OIB   LOCATION:  3707                                 FACILITY:  [facility]   PHYSICIAN:  Jonelle Sidle, M.D. [facility]        DATE OF BIRTH:  [birth year]   DATE OF ADMISSION:  07/01/2002  DATE OF DISCHARGE:  07/02/2002                           DISCHARGE SUMMARY - REFERRING   SUMMARY OF HISTORY:  Ms. Sharon Cain is a 56 year old white female who was seen in  our office on June 29, 2002.  She was referred for chest discomfort which  she described as not associated with shortness of breath and with exertion  relieved with rest.  She states that it occurs with minimal exertion.  She  can just be throwing a softball back and forth, and it would be apparent.  She also would get profound diaphoresis.  She has had this discomfort for  the preceding two months.   PAST MEDICAL HISTORY:  1. Tobacco use.  2. Hypertension.  3. Hyperlipidemia which is untreated.   CHEST X-RAY:  Borderline cardiomegaly.   LABORATORY DATA:  H&H 12.9 and 40.1, normal indices, platelets 271, WBC 7.7.  Sodium 138, potassium 4.6, BUN 11, creatinine 0.6.  PT 12.4, PTT 27.   HOSPITAL COURSE:  Ms. Sharon Cain was brought in for cardiac catheterization.  This was performed on July 01, 2002, by Dr. Chales Abrahams.  She did not have any  coronary artery disease.  Her EF was 55%.  After review on April 9 by Dr.  Diona Browner, Dr. Diona Browner felt that she had no obstructive coronary artery  disease and a normal LV function.  He also makes notes that according to the  patient she was told at one point she might need a CPAP for sleep apnea.  Dr. Diona Browner felt that she could be discharged home.   DISCHARGE DIAGNOSES:  1. Non-cardiac chest discomfort.  2. No obstructive coronary artery disease.  3. History of hypertension.  4. Dyslipidemia.  5. Remote tobacco use.  6. Possible sleep apnea.   DISCHARGE  MEDICATIONS:  She is asked to continue her home medications which  include  1. Inderal 40 mg b.i.d.  2. Effexor 75 mg every day.  3. Aspirin 81 mg every day.   DISPOSITION:  She is advised no lifting, driving, sexual activity, or heavy  exertion or softball.  Maintain low-salt, fat, cholesterol diet.  If she had  any problems with her catheterization site, she was asked to call our  office.  She was asked to make a follow up appointment with Dr. Marjo Bicker as  needed and to call Dr. Marty Heck office on Monday to  schedule a follow up visit in one to two weeks.  Dr. Diona Browner notes that she  should not return to softball for one week, allowing her leg to heal.  He  also felt that her goal for LDL should be  less than 100, and Dr. Reola Calkins should  follow up on her possible sleep apnea.     Joellyn Rued, P.A. [facility]                    Jonelle Sidle, M.D. [facility]    EW/MEDQ  D:  07/02/2002  T:  07/03/2002  Job:  [id number]   cc:   Gaynelle Cage, MD  [phone] [address]  [place]  [place] [address]  Fax: [phone]   Dr. [facility]

## 2010-08-10 NOTE — Cardiovascular Report (Signed)
NAME:  Sharon Cain, Sharon Cain                           ACCOUNT NO.:  [id number]   MEDICAL RECORD NO.:  [id number]                   PATIENT TYPE:  OIB   LOCATION:  3707                                 FACILITY:  [facility]   PHYSICIAN:  Veneda Melter, M.D. [facility]               DATE OF BIRTH:  05-Jan-1956   DATE OF PROCEDURE:  07/01/2002  DATE OF DISCHARGE:  07/02/2002                              CARDIAC CATHETERIZATION   PROCEDURES PERFORMED:  1. Right heart catheterization.  2. Left heart catheterization.  3. Left ventriculogram.  4. Selective coronary angiography.   DIAGNOSES:  1. Trivial coronary artery disease by angiogram.  2. Normal left ventricular systolic function.   HISTORY:  Sharon Cain is a 55 year old white female with a strong family  history of early coronary artery disease as well as a history of tobacco use  who presents with progressive shortness of breath.  The patient underwent a  treadmill stress test and was unable to perform the study due to significant  dyspnea.  She is referred for further assessment.   TECHNIQUE:  Informed consent was obtained.  Patient was brought to the  catheterization lab.  A 6-French arterial sheath was placed in the right  groin using modified Seldinger technique.  6-French [id number] and [id number] catheters  were then used to engage the left and right coronary arteries, and selective  angiography was performed in various projections using manual injection of  contrast.  At this point, we elected to proceed with right heart  catheterization.  An 8-French sheath was placed in the right femoral vein.  A 7.5-French PA catheter was advanced in the pulmonary artery.  Appropriate  right-sided hemodynamics were obtained.   A 6-French pigtail catheter was then advanced in the left ventricle, and  appropriate left-sided hemodynamics were obtained.  A left ventriculogram  was then performed using power injection of contrast.  The catheters and  sheaths were then  removed and manual pressure applied until adequate  hemostasis was achieved.  The patient tolerated the procedure well and was  transferred to the floor in stable condition.   FINDINGS:  1. RIGHT HEART CATHETERIZATION:     a. RA equals 14/11, mean equals 10, RV equals 34/8, PA equals 34/15,        pulmonary capillary wedge pressure equals 17/13, mean equals 12.     b. Cardiac output is 5.8 L per minute with a cardiac index of 3.1 L per        minute per square meter by thermodilution.     c. Cardiac output and index are 4.8 and 2.6, respectively, by Fick        method.     d. PA saturation is 68%.  Aortic saturation is 90%.  2. LEFT HEART CATHETERIZATION:     a. Left main trunk, a medium-caliber vessel, angiographically normal.     b. LAD was a medium-caliber  vessel that provides a large diagonal branch        in the proximal segment. The LAD tapers onto and extends to the apex.        It has luminal irregularities.     c. Left circumflex artery is dominant.  It is a large-caliber vessel that        provides a large first marginal branch in the proximal segment and        then provides a small posteroventricular branch distally as well as        the PDA.  The left circumflex system has mild irregularities.  A very        mild intramyocardial bridge is noted in the mid AV circumflex.     d. Right coronary artery is nondominant; this consists of a small vessel        that provides an RV marginal branch.     e. LV, normal end-systolic and end-diastolic dimensions.  Overall, left        ventricular function is well preserved.  Ejection fraction is greater        than 55%.  No mitral regurgitation.  LV pressure is 129/10, aortic is        130/70.  LVEDP was 20.   ASSESSMENT AND PLAN:  Sharon Cain is a 55 year old female with mild coronary  artery disease and well-preserved left ventricular function by a left heart  catheterization.  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  She has very mild pulmonary hypertension and normal cardiac  output.  Continued medical therapy and risk factor modification will be  pursued and other causes of dyspnea investigated.                                               Veneda Melter, M.D. [facility]    NG/MEDQ  D:  07/01/2002  T:  07/02/2002  Job:  [id number]   cc:   Gaynelle Cage, MD  [phone] [address]  [place]  [place] [address]  Fax: [phone]   Vida Roller, M.D.  Fax: [phone]

## 2010-08-10 NOTE — Procedures (Signed)
NAME:  Sharon Cain, Sharon Cain                           ACCOUNT NO.:  [id number]   MEDICAL RECORD NO.:  [id number]                   PATIENT TYPE:  OUT   LOCATION:  RAD                                  FACILITY:  [facility]   PHYSICIAN:  Edward L. Juanetta Gosling, M.D.             DATE OF BIRTH:  [birth year]   DATE OF PROCEDURE:  DATE OF DISCHARGE:  06/09/2003                              PULMONARY FUNCTION TEST   IMPRESSION:  1. Spirometry is normal.  2. Lung volumes are normal.  3. DLCO is mildly to moderately reduced.      ___________________________________________                                            Oneal Deputy. Juanetta Gosling, M.D.   Gwenlyn Found  D:  06/13/2003  T:  06/14/2003  Job:  [id number]   cc:   Donna Bernard, M.D.  [address]  [place]  [place] [address]  Fax: [phone]

## 2010-08-22 ENCOUNTER — Other Ambulatory Visit: Payer: Self-pay | Admitting: Family Medicine

## 2010-08-23 NOTE — Telephone Encounter (Signed)
Refill request

## 2010-09-20 ENCOUNTER — Other Ambulatory Visit: Payer: Self-pay | Admitting: Family Medicine

## 2010-09-20 MED ORDER — ZOLPIDEM TARTRATE 5 MG PO TABS: 5.0000 mg | ORAL_TABLET | Freq: Every evening | ORAL | Status: DC | PRN

## 2010-09-25 ENCOUNTER — Other Ambulatory Visit: Payer: Self-pay | Admitting: Family Medicine

## 2010-09-25 ENCOUNTER — Encounter: Payer: Self-pay | Admitting: Family Medicine

## 2010-09-25 MED ORDER — ZOLPIDEM TARTRATE 5 MG PO TABS: 5.0000 mg | ORAL_TABLET | Freq: Every evening | ORAL | Status: DC | PRN

## 2010-09-25 NOTE — Telephone Encounter (Signed)
Refill based upon faxed request. Called pharmacy to verify no script received from 09/20/10 written by Dr. Jeanice Lim.

## 2010-11-15 ENCOUNTER — Ambulatory Visit (HOSPITAL_COMMUNITY)
Admission: RE | Admit: 2010-11-15 | Discharge: 2010-11-15 | Disposition: A | Discharge status: Home / Self Care | Payer: BC Managed Care – PPO | Source: Ambulatory Visit | Attending: Family Medicine | Admitting: Family Medicine

## 2010-11-15 ENCOUNTER — Observation Stay (HOSPITAL_COMMUNITY)
Admission: AD | Admit: 2010-11-15 | Discharge: 2010-11-16 | DRG: 143 | Disposition: A | Discharge status: Home / Self Care | Payer: BC Managed Care – PPO | Source: Ambulatory Visit | Attending: Family Medicine | Admitting: Family Medicine

## 2010-11-15 ENCOUNTER — Other Ambulatory Visit: Payer: Self-pay

## 2010-11-15 ENCOUNTER — Ambulatory Visit (INDEPENDENT_AMBULATORY_CARE_PROVIDER_SITE_OTHER): Payer: BC Managed Care – PPO | Admitting: Family Medicine

## 2010-11-15 ENCOUNTER — Encounter: Payer: Self-pay | Admitting: Family Medicine

## 2010-11-15 ENCOUNTER — Inpatient Hospital Stay (HOSPITAL_COMMUNITY): Payer: BC Managed Care – PPO

## 2010-11-15 VITALS — BP 120/80 | HR 75 | Temp 99.2°F | Wt 195.8 lb

## 2010-11-15 DIAGNOSIS — R079 Chest pain, unspecified: Secondary | ICD-10-CM

## 2010-11-15 DIAGNOSIS — I10 Essential (primary) hypertension: Secondary | ICD-10-CM

## 2010-11-15 DIAGNOSIS — F411 Generalized anxiety disorder: Secondary | ICD-10-CM | POA: Insufficient documentation

## 2010-11-15 DIAGNOSIS — R05 Cough: Secondary | ICD-10-CM

## 2010-11-15 DIAGNOSIS — K219 Gastro-esophageal reflux disease without esophagitis: Secondary | ICD-10-CM | POA: Insufficient documentation

## 2010-11-15 DIAGNOSIS — E785 Hyperlipidemia, unspecified: Secondary | ICD-10-CM | POA: Insufficient documentation

## 2010-11-15 DIAGNOSIS — M129 Arthropathy, unspecified: Secondary | ICD-10-CM | POA: Insufficient documentation

## 2010-11-15 DIAGNOSIS — R059 Cough, unspecified: Secondary | ICD-10-CM

## 2010-11-15 DIAGNOSIS — R0789 Other chest pain: Principal | ICD-10-CM | POA: Insufficient documentation

## 2010-11-15 DIAGNOSIS — R053 Chronic cough: Secondary | ICD-10-CM

## 2010-11-15 DIAGNOSIS — J3489 Other specified disorders of nose and nasal sinuses: Secondary | ICD-10-CM | POA: Insufficient documentation

## 2010-11-15 DIAGNOSIS — E669 Obesity, unspecified: Secondary | ICD-10-CM | POA: Insufficient documentation

## 2010-11-15 DIAGNOSIS — I4949 Other premature depolarization: Secondary | ICD-10-CM | POA: Insufficient documentation

## 2010-11-15 DIAGNOSIS — R7309 Other abnormal glucose: Secondary | ICD-10-CM | POA: Insufficient documentation

## 2010-11-15 DIAGNOSIS — F3289 Other specified depressive episodes: Secondary | ICD-10-CM | POA: Insufficient documentation

## 2010-11-15 DIAGNOSIS — F329 Major depressive disorder, single episode, unspecified: Secondary | ICD-10-CM | POA: Insufficient documentation

## 2010-11-15 DIAGNOSIS — G4700 Insomnia, unspecified: Secondary | ICD-10-CM | POA: Insufficient documentation

## 2010-11-15 DIAGNOSIS — E876 Hypokalemia: Secondary | ICD-10-CM | POA: Insufficient documentation

## 2010-11-15 HISTORY — DX: Chest pain, unspecified: R07.9

## 2010-11-15 LAB — CBC
MCHC: 35 g/dL (ref 30.0–36.0)
Platelets: 200 10*3/uL (ref 150–400)
RDW: 13.2 % (ref 11.5–15.5)

## 2010-11-15 LAB — COMPREHENSIVE METABOLIC PANEL
Albumin: 3.9 g/dL (ref 3.5–5.2)
Alkaline Phosphatase: 62 U/L (ref 39–117)
BUN: 18 mg/dL (ref 6–23)
Potassium: 3.7 mEq/L (ref 3.5–5.1)
Sodium: 139 mEq/L (ref 135–145)
Total Protein: 6.9 g/dL (ref 6.0–8.3)

## 2010-11-15 LAB — CARDIAC PANEL(CRET KIN+CKTOT+MB+TROPI)
CK, MB: 2 ng/mL (ref 0.3–4.0)
Relative Index: INVALID (ref 0.0–2.5)
Total CK: 64 U/L (ref 7–177)
Troponin I: <0.3 ng/mL (ref <0.30)

## 2010-11-15 NOTE — Progress Notes (Signed)
  Subjective:    Patient ID: Sharon Cain, female    DOB: 04-May-1955, 55 y.o.   MRN: [id number]  HPI Patient presents for chest congestion and intermittent chest pain.  1. Chest Congestion - several months in duration - relapsing and remitting course, non-productive cough, occasional sinus drainage, no  denies hemoptysis, fever/chills, weight loss, multiple dogs in home but not allergic, seasonal allergies controlled with cetirizine 10 mg daily, sick contacts include mother with hospitalization for bronchitis in June and nephew with croup  2. Chest Pain - central, non-radiating, sharp, duration of a 3-20 seconds, resolves spontaneously, comes on while active but no pattern, no pain at rest, 2/10 currently, worst is 8/10; may be secondary to stress/anxiety, has not taken any medication to relieve pain but takes daily aspirin, denies history of heart attack, had cardiac cath in 2004 that showed no stenosis, family history + for CAD in father and CHF in mother    Review of Systems    Negative unless stated above  Objective:   Physical Exam BP 120/80  Pulse 75  Temp(Src) 99.2 F (37.3 C) (Oral)  Wt 195 lb 12.8 oz (88.814 kg)  Gen: alert and oriented X 3, mild discomfort in chest, obese Head: NCAT, no frontal or maxillary sinus tenderness Eyes: PERRLA, EOMI Ears: fluid with air bubbles behind right TM, left external ear canal occluded with cerumen Mouth/Throat: OP clear, no tonsillar exudates or erythema  Cardiac: Irregular rhythm with rapid intermittent beat every 3-4 contractions, regular rate, no M,R,G Lungs: CTA-B, no dullness to percussion, no wheezes, rhonchi, rales; peak flow 77% of predicted  Abd: soft, NDNT, NABS, no hepatosplenomegaly Extremities: 2+ DP pulses, no edema    Studies: EKG - demonstrated multiple PVC's, non-specific ST changes Peak Pulm Flow - 77% of predicted     Assessment & Plan:  Sharon Cain is 55 y.o. F with obesity, HTN, hyperlipidemia with a chief complaint  of potential cardiac chest pain and an abnormal EKG.

## 2010-11-15 NOTE — Assessment & Plan Note (Signed)
Admit to [facility] Medicine Teaching service for further cardiac evaluation

## 2010-11-15 NOTE — Assessment & Plan Note (Signed)
Given the concern for cardiac chest pain, this is not going to be investigate fully right now. Follow-up after discharge for work up.

## 2010-11-15 NOTE — H&P (Signed)
Family Medicine Teaching [facility] Admission History and Physical  Patient name: Sharon Cain Medical record number: [id number] Date of birth: 07-Oct-1955 Age: 55 y.o. Gender: female  Primary Care Provider: Mat Carne, MD, MD  Chief Complaint: Chest pain History of Present Illness: Sharon Cain is a 55 y.o. year old female presenting from the [facility] Medicine clinic with substernal chest pain. Pt went to [facility] Medicine Clinic to get evaluated for chest congestion that had been happening on and off for 1 month. She describes the pain as tight, sharp pain lasting a few seconds. It is independent from being at rest or during activity, although she does experience it more when she is feeling upset or anxious. She denies any nausea or vomiting. She describes some diaphoresis after some episodes but is unclear if this is related to hot flashes. She denies any radiation of the pain. She denies any trauma to the chest area. She has a history of GERD but this is controled with ranitidine. The last time she felt the chest tightness was earlier today in the doctor's office when she was describing an upsetting event that happened yesterday and had also prompted an episode of chest discomfort. She did not get any nitroglycerin. She has noticed some increased shortness of breath while walking her dogs.   Patient Active Problem List  Diagnoses  . HYPERLIPIDEMIA  . OBESITY  . ANXIETY STATE NOS  . INSOMNIA-SLEEP DISORDER-UNSPEC  . DEPRESSION  . HYPERTENSION  . ALLERGIC RHINITIS  . OSTEOARTHRITIS  . TOBACCO USE, QUIT  . Abdominal pain, acute, right upper quadrant  . Chest pain, central  . Chronic cough   Past Medical History: Past Medical History  Diagnosis Date  . Hypertension   . Hyperlipidemia   . Depression   . Arthritis   . Anxiety   . Insomnia   . Obesity     Past Surgical History: Past Surgical History  Procedure Date  . Cholecystectomy   - BTL   Social History: -  smoking: quit 1 year ago. 15 pack year history. - EtOH: occasional   Family History: Mother: DM, CHF, arrhythmia, COPD Father: "enlarged heart" and "bad lungs".  Allergies: No Known Allergies  Medications:  HCTZ 25mg  qd Lovaza 20mg  bid Ranitidine  Simvastatin 20mg  po qd Review Of Systems: Negative except per HPI Physical Exam: Pulse: 74  Blood Pressure: 141/75 RR: 20   O2: 92 on RA Temp: 98.1  General: alert, cooperative, mildly obese and pleasant woman HEENT: PERRLA and extra ocular movement intact Heart: S1, S2 normal, no murmur, rub or gallop, regular rate and rhythm Lungs: clear to auscultation, no wheezes or rales and unlabored breathing Abdomen: abdomen is soft without significant tenderness, masses, organomegaly or guarding Extremities: extremities normal, atraumatic, no cyanosis or edema Skin:no rashes Neurology: normal without focal findings, mental status, speech normal, alert and oriented x3 and PERLA  Labs and Imaging:  EKG: rate 72, PVC's, anterolateral ST and T wave abnormality. Poor tracing.   Assessment and Plan: Sharon Cain is a 55 y.o. year old female presenting with chest pain 1. Chest pain: rule out ACS: will cycle 3 sets of cardiac enzymes and repeat EKG in morning. Patient on telemetry. We will also check CXR in morning to rule out any respiratory process.  2. Risk stratification assessment: will check fasting lipids and HbA1C in morning.  3. Hypertension: continue home HCTZ 4. Hyperlipidemia: continue simvastatin and lovaza 2. FEN/GI: heart healthy diet. 3. Prophylaxis: heparin 5000u tid 4.  Disposition: pending ACS rule out

## 2010-11-16 LAB — CBC
Hemoglobin: 14.3 g/dL (ref 12.0–15.0)
MCH: 31.1 pg (ref 26.0–34.0)
MCHC: 35.3 g/dL (ref 30.0–36.0)

## 2010-11-16 LAB — BASIC METABOLIC PANEL
CO2: 26 mEq/L (ref 19–32)
Calcium: 9.6 mg/dL (ref 8.4–10.5)
Glucose, Bld: 103 mg/dL — ABNORMAL HIGH (ref 70–99)
Sodium: 139 mEq/L (ref 135–145)

## 2010-11-16 LAB — HEMOGLOBIN A1C: Mean Plasma Glucose: 123 mg/dL — ABNORMAL HIGH (ref <117)

## 2010-11-16 LAB — CARDIAC PANEL(CRET KIN+CKTOT+MB+TROPI)
Relative Index: INVALID (ref 0.0–2.5)
Total CK: 60 U/L (ref 7–177)
Total CK: 48 U/L (ref 7–177)

## 2010-11-16 LAB — LIPID PANEL
HDL: 38 mg/dL — ABNORMAL LOW (ref >39)
LDL Cholesterol: 77 mg/dL (ref 0–99)
Total CHOL/HDL Ratio: 4.2 RATIO
Triglycerides: 220 mg/dL — ABNORMAL HIGH (ref <150)
VLDL: 44 mg/dL — ABNORMAL HIGH (ref 0–40)

## 2010-12-03 NOTE — Discharge Summary (Signed)
NAME:  Sharon Cain, Sharon Cain                 ACCOUNT NO.:  [id number]  MEDICAL RECORD NO.:  [id number]  LOCATION:  4738                         FACILITY:  [facility]  PHYSICIAN:  Garnet Chatmon A. Sheffield Slider, M.D.    DATE OF BIRTH:  November 05, 1955  DATE OF ADMISSION:  11/15/2010 DATE OF DISCHARGE:  11/16/2010                              DISCHARGE SUMMARY   PRIMARY CARE PROVIDER:  Dr. Clinton Sawyer at [facility].  DISCHARGE DIAGNOSES: 1. Chest pain. 2. Hypertension. 3. Hyperlipidemia. 4. Gastroesophageal reflux disease. 5. Arthritis. 6. Anxiety.  DISCHARGE MEDICATIONS:  New medication on discharge, Afrin takes for nasal congestion no longer than 3 days one spray intranasally three times a day as needed.   Nitroglycerin 0.4 mg tab sublingual to take as needed for chest pain every 5 minutes as needed up to three doses.  Same medications: 1. Aspirin 81 mg 1 tablet by mouth p.o. daily. 2. Cetirizine 10 mg by mouth 1 tablet daily. 3. Fish oil 1000 mg 2 capsules by mouth twice daily. 4. Glucosamine OTC 1 tablet by mouth twice daily. 5. Hydrochlorothiazide 25 mg 1 tablet by mouth daily. 6. Ibuprofen 600 mg for pain 1 tablet by mouth every 6 hours as needed     p.r.n. pain. 7. Ranitidine 150 mg 1 tablet by mouth daily at bedtime. 8. Multivitamin 1 tablet by mouth daily. 9. Simvastatin 20 mg 1 tablet by mouth daily at bedtime. 10.Zolpidem 5 mg 1 tablet by mouth daily at bedtime.  CONSULTS:  None.  PROCEDURES:  EKG on November 15, 2010, and November 16, 2010, showed PVCs and anterolateral nonspecific ST-T waves abnormalities.  Chest x-ray on November 15, 2010, no acute findings.  LABORATORY DATA:  CBC on admission WBC 7.3, hemoglobin 14.4, hematocrit 41.2, platelets 200.  CMP on admission, sodium 139, potassium 3.7, chloride 104, bicarb 27, glucose 91, BUN 18, creatinine 0.47, bilirubin 0.7, alk phos 62, AST 22, ALT 27, total protein 6.9, albumin 3.9, calcium 9.9.  Cardiac enzymes, troponin  negative x3.  CK-MB negative x3. Lipid profile, cholesterol total 159, triglycerides 220, HDL 38, LDL 77, VLDL 44.  Labs on discharge, BMET, sodium 139, potassium 3.1, chloride 104, CO2 of 26, glucose 103, BUN 15, creatinine 0.47, calcium 9.6.  CBC, WBC 6, hemoglobin 14.3, hematocrit 40.5, platelet count 177, magnesium 2.3, HbA1c 5.9.  BRIEF HOSPITAL COURSE:  This is a 55 year old female with history of hypertension, hyperlipidemia, arthritis, GERD, and anxiety, who presented from clinic with chest pain and to rule out MI.  1. Chest pain.  The patient had been complaining of 6-month history of mild     sternal chest tightness lasting a few seconds and not brought on by     exertion.  The patient experienced tightness in office before being     admitted to hospital after talking about upsetting and anxiety-     inducing events that had caused the chest pain the day before.     Cardiac enzymes were negative x3.  Chest x-ray showed no acute     process.  EKG showed PVCs and nonspecific ST-T wave abnormality.     The patient was put on telemetry which did  show 5 lead of V tach.     The patient was asymptomatic during this event.  The patient did     mention that she had been told that her heart had an extra beat     when preop for carpal tunnel surgery a few years ago.  The patient     denies any chest pain during the course of the admission.  The     patient was discharged with appointment for cardiac stress test     with [facility].  The patient was discharged     with aspirin and nitroglycerin to take in the event of chest pain. 2. Hypertension.  The patient was kept on hydrochlorothiazide 25 mg     daily.  Blood pressure on admission was 141/75 and on discharge was     112/72.  The patient was discharged on home dose of     hydrochlorothiazide. 3. Hyperlipidemia.  Fasting lipid panel showed elevation of     triglycerides and low HDL.  The patient was discharged  home with     home dose of simvastatin and followup appointment with PCP. 4. GERD.  The patient was discharged on ranitidine. 5. Nasal congestion.  The patient complained of nasal congestion     during admission.  Afrin spray was prescribed in the acute setting.     The patient also discharged on Zyrtec for seasonal allergies. 6. Hypokalemia.  The patient's potassium decreased from 3.7 to 3.1.     The patient was given 40 mEq of potassium chloride by mouth. 7. Prediabetes.  The patient was found to have hemoglobin A1c of 5.9,     putting her at increased risk for diabetes.  The patient was to     follow up with primary care provider.  DISCHARGE INSTRUCTIONS:  Low-sodium, heart-healthy diet.  Followup appointment with Dr. Clinton Sawyer, the patient to call to make an appointment.  The patient is to have exercise stress test.  The patient was to be called by [facility] to arrange an appointment for a stress test.  DISCHARGE CONDITION:  The patient was discharged home in stable medical condition.    ______________________________ Marena Chancy, MD   ______________________________ Arnette Norris. Sheffield Slider, M.D.    SL/MEDQ  D:  11/18/2010  T:  11/18/2010  Job:  [id number]  cc:   [facility]  Electronically Signed by Marena Chancy MD on 11/23/2010 06:49:11 PM Electronically Signed by Zachery Dauer M.D. on 12/03/2010 10:15:00 AM

## 2010-12-03 NOTE — H&P (Signed)
NAME:  Sharon Cain, Sharon Cain                 ACCOUNT NO.:  [id number]  MEDICAL RECORD NO.:  [id number]  LOCATION:  4738                         FACILITY:  [facility]  PHYSICIAN:  Laporche Martelle A. Sheffield Slider, M.D.    DATE OF BIRTH:  29-May-1955  DATE OF ADMISSION:  11/15/2010 DATE OF DISCHARGE:                             HISTORY & PHYSICAL   PRIMARY CARE PROVIDER:  Mat Carne, MD  CHIEF COMPLAINT:  Chest pain.  HISTORY OF PRESENT ILLNESS:  Sharon Cain is 55 year old female presenting from [facility] with substernal chest pain.  The patient went to family medicine clinic to get evaluated for chest congestion that has been going on for about 1 month.  She describes the pain as tight, sharp pain lasting a few seconds, is independent from being at rest or during activity, although she does experience more when she is feeling upset or anxious.  She denies any nausea or vomiting. She describes some diaphoresis after some episodes, but it is unclear if this is related to hot flashes.  She denies any radiation of the pain. She denies any trauma to the chest area.  She has a history of heartburn, but this controlled with her ranitidine.  Last time she felt the chest tightness was earlier today in doctor's office when she was describing an upsetting event that happened yesterday and that also prompted an episode of chest discomfort.  She denied getting any nitroglycerin in the office or in the hospital.  She has noticed some increased shortness of breath while walking her dogs, which did not happen previously.  PAST MEDICAL HISTORY:  Hypertension, hyperlipidemia, depression, arthritis, anxiety, insomnia, obesity.  PAST SURGICAL HISTORY: 1. Cholecystectomy. 2. Bilateral tubal ligation.  SOCIAL HISTORY: 1. Tobacco, quit 1 year ago, has a 15-pack year history. 2. Alcohol, occasional use. 3. Denies any illicit drug use.  FAMILY HISTORY:  Mother has diabetes, CHF, arrhythmia, and COPD.   Father has "enlarged heart" and "bad lungs."  ALLERGIES:  No known allergies.  MEDICATIONS: 1. Hydrochlorothiazide 25 mg per day. 2. Lovaza 20 mg b.i.d. 3. Ranitidine. 4. Simvastatin 20 mg p.o. daily.  REVIEW OF SYSTEMS:  Negative except for HPI.  PHYSICAL EXAMINATION:  VITAL SIGNS:  Pulse 74, blood pressure 141/75, respiratory rate 20, O2 of 92 on room air, temperature 98.1. GENERAL:  Alert, cooperative, mildly obese and pleasant lady. HEENT:  Pupils equal, round, and reactive to light and accommodation. Extraocular movement intact. HEART:  S1 and S2 normal.  No murmur, rub, or gallop.  Regular rate and rhythm. LUNGS:  Clear to auscultation bilaterally with no wheezes or rales and unlabored breathing. ABDOMEN:  Soft without significant tenderness, masses, organomegaly, or guarding. EXTREMITIES:  Normal and atraumatic.  No cyanosis or edema. SKIN:  No rashes. NEUROLOGY:  Normal without focal findings.  Mental status, speech normal.  Alert and oriented x3.  LABORATORY DATA AND IMAGING:  EKG, rate 72, PVCs, anterolateral ST-T wave abnormality with poor tracing of EKG.  ASSESSMENT AND PLAN:  Sharon Cain is a 55 year old female presenting with chest pain. 1. Chest pain, rule out acute coronary syndrome.  We will cycle three     sets of cardiac enzymes  and repeat EKG in the morning.  The patient     is on telemetry.  We will also check a chest x-ray in the morning     to rule out any respiratory process. 2. Risk stratification assessment.  We will check fasting lipids and     HbA1c in the morning. 3. Hypertension.  Continue the home hydrochlorothiazide. 4. Hyperlipidemia.  Continue simvastatin and Lovaza. 5. Fluids, electrolytes, nutrition, gastrointestinal, heart healthy     diet. 6. Prophylaxis.  Heparin 5000 units t.i.d. 7. Disposition pending.  Acute coronary syndrome rule out.    ______________________________ Marena Chancy,  MD   ______________________________ Arnette Norris. Sheffield Slider, M.D.    SL/MEDQ  D:  11/15/2010  T:  11/16/2010  Job:  [id number]  Electronically Signed by Marena Chancy MD on 11/16/2010 10:36:31 PM Electronically Signed by Zachery Dauer M.D. on 12/03/2010 10:16:15 AM

## 2010-12-14 ENCOUNTER — Encounter (HOSPITAL_COMMUNITY): Payer: Self-pay | Admitting: *Deleted

## 2010-12-14 ENCOUNTER — Emergency Department (HOSPITAL_COMMUNITY)
Admission: EM | Admit: 2010-12-14 | Discharge: 2010-12-14 | Disposition: A | Discharge status: Home / Self Care | Payer: BC Managed Care – PPO | Attending: Emergency Medicine | Admitting: Emergency Medicine

## 2010-12-14 DIAGNOSIS — E785 Hyperlipidemia, unspecified: Secondary | ICD-10-CM | POA: Insufficient documentation

## 2010-12-14 DIAGNOSIS — I10 Essential (primary) hypertension: Secondary | ICD-10-CM | POA: Insufficient documentation

## 2010-12-14 DIAGNOSIS — J029 Acute pharyngitis, unspecified: Secondary | ICD-10-CM | POA: Insufficient documentation

## 2010-12-14 DIAGNOSIS — Z87891 Personal history of nicotine dependence: Secondary | ICD-10-CM | POA: Insufficient documentation

## 2010-12-14 DIAGNOSIS — Z7982 Long term (current) use of aspirin: Secondary | ICD-10-CM | POA: Insufficient documentation

## 2010-12-14 NOTE — ED Notes (Signed)
Pt c/o pain to right ear and throat. Pt states she is around kids every day and is scared to have strep throat. Pt states it hurts to swallow and cough.

## 2010-12-14 NOTE — ED Notes (Signed)
Called lab for rapid strep results. Lab has not processed test and can give no results.

## 2010-12-14 NOTE — ED Provider Notes (Signed)
Medical screening examination/treatment/procedure(s) were performed by non-physician practitioner and as supervising physician I was immediately available for consultation/collaboration.  Donnetta Hutching, MD 12/14/10 1536

## 2010-12-14 NOTE — ED Provider Notes (Signed)
History     CSN: [id number] Arrival date & time: 12/14/2010 11:31 AM  Chief Complaint  Patient presents with  . Nasal Congestion    HPI  (Consider location/radiation/quality/duration/timing/severity/associated sxs/prior treatment)  HPI Comments: Pt has had a sore throat x 4 days.  + pain with swallowing.  + subj fever.  + postnasal drip.  No sinus pain.  The history is provided by the patient. No language interpreter was used.    Past Medical History  Diagnosis Date  . Hypertension   . Hyperlipidemia   . Depression   . Arthritis   . Anxiety   . Insomnia   . Obesity     Past Surgical History  Procedure Date  . Cholecystectomy   . Carpal tunnel release     bilateral    History reviewed. No pertinent family history.  History  Substance Use Topics  . Smoking status: Former Games developer  . Smokeless tobacco: Not on file  . Alcohol Use: No    OB History    Grav Para Term Preterm Abortions TAB SAB Ect Mult Living                  Review of Systems  Review of Systems  Constitutional: Positive for fever.  HENT: Positive for rhinorrhea, trouble swallowing and postnasal drip. Negative for sinus pressure.   All other systems reviewed and are negative.    Allergies  Review of patient's allergies indicates no known allergies.  Home Medications   Current Outpatient Rx  Name Route Sig Dispense Refill  . ASPIRIN 81 MG PO TBEC Oral Take 81 mg by mouth daily.      Marland Kitchen CETIRIZINE HCL 10 MG PO TABS Oral Take 10 mg by mouth daily.      Marland Kitchen GLUCOSAMINE 500 MG PO CAPS Oral Take 2 capsules by mouth daily.      Marland Kitchen HYDROCHLOROTHIAZIDE 25 MG PO TABS  TAKE ONE TABLET BY MOUTH EVERY DAY IN THE MORNING 90 tablet 0  . MULTIVITAMIN PO Oral Take 1 tablet by mouth daily.      Marland Kitchen FISH OIL 1000 MG PO CAPS Oral Take 2 capsules by mouth 2 (two) times daily.     Marland Kitchen POTASSIUM 99 MG PO TABS Oral Take 1 tablet by mouth every other day.      Marland Kitchen RANITIDINE HCL 150 MG PO TABS  TAKE ONE TABLET BY MOUTH  EVERY DAY AT BEDTIME 30 tablet 6  . SIMVASTATIN 20 MG PO TABS Oral Take 1 tablet (20 mg total) by mouth daily. 30 tablet 6  . ZOLPIDEM TARTRATE 5 MG PO TABS Oral Take 5 mg by mouth at bedtime.      . IBUPROFEN 600 MG PO TABS  TAKE ONE TABLET BY MOUTH EVERY 6 HOURS WITH FOOD AS NEEDED FOR PAIN 60 tablet 3    Physical Exam    BP 109/68  Pulse 67  Temp(Src) 98.8 F (37.1 C) (Oral)  Resp 20  Ht 5\' 1"  (1.549 m)  Wt 198 lb (89.812 kg)  BMI 37.41 kg/m2  SpO2 98%  Physical Exam  Nursing note and vitals reviewed. Constitutional: She is oriented to person, place, and time. She appears well-developed and well-nourished.  HENT:  Head: Normocephalic and atraumatic.  Right Ear: External ear normal.  Left Ear: External ear normal.  Nose: Nose normal.       + pharyngeal erythema.  No tonsillar swelling or exudates.  Uvular midline.  Eyes: EOM are normal. Pupils are equal, round,  and reactive to light.  Neck: Normal range of motion. No tracheal deviation present. No thyromegaly present.  Cardiovascular: Normal rate, regular rhythm and normal heart sounds.   Pulmonary/Chest: Effort normal and breath sounds normal.  Abdominal: Soft. She exhibits no distension. There is no tenderness.  Musculoskeletal: Normal range of motion.  Lymphadenopathy:    She has no cervical adenopathy.  Neurological: She is alert and oriented to person, place, and time.  Skin: Skin is warm and dry. She is not diaphoretic.  Psychiatric: She has a normal mood and affect. Judgment normal.    ED Course  Procedures (including critical care time)   Labs Reviewed  RAPID STREP SCREEN   Dg Chest 2 View  11/16/2010  *RADIOLOGY REPORT*  Clinical Data: Chest pains.  CHEST - 2 VIEW  Comparison: 12/02/2007  Findings: Two views of the chest demonstrate clear lungs.  Heart size is upper limits of normal and stable.  The trachea is midline. Bony structures are intact.  IMPRESSION: No acute chest findings.  Original Report  Authenticated By: Richarda Overlie, M.D.     No diagnosis found.   MDM         Worthy Rancher, PA 12/14/10 1337  Worthy Rancher, PA 12/14/10 1431

## 2010-12-14 NOTE — ED Notes (Signed)
Nasal congestion, right earache, sore throat, coughing since Tuesday. Denies fever.

## 2010-12-28 ENCOUNTER — Encounter: Payer: Self-pay | Admitting: Family Medicine

## 2010-12-28 ENCOUNTER — Ambulatory Visit (INDEPENDENT_AMBULATORY_CARE_PROVIDER_SITE_OTHER): Payer: BC Managed Care – PPO | Admitting: Family Medicine

## 2010-12-28 VITALS — BP 117/77 | HR 69 | Wt 198.5 lb

## 2010-12-28 DIAGNOSIS — H6590 Unspecified nonsuppurative otitis media, unspecified ear: Secondary | ICD-10-CM

## 2010-12-28 DIAGNOSIS — Z23 Encounter for immunization: Secondary | ICD-10-CM

## 2010-12-28 DIAGNOSIS — H6591 Unspecified nonsuppurative otitis media, right ear: Secondary | ICD-10-CM

## 2010-12-28 NOTE — Patient Instructions (Signed)
Dear [name],   Thank you for coming to clinic today. In regards to your right ear pain, I cannot say for certain if there is fluid behind the ear. It appears that you have some fluid, but the pressure test was not conclusive. Please use Sudafed for 5 days. It is an over the counter medication, so you can follow the proper instructions on the box. If it is ineffective, then I will refer you to an ENT physician.   Take Care,   Dr. Clinton Sawyer

## 2011-01-05 DIAGNOSIS — H6591 Unspecified nonsuppurative otitis media, right ear: Secondary | ICD-10-CM | POA: Insufficient documentation

## 2011-01-05 NOTE — Progress Notes (Signed)
  Subjective:    Patient ID: Sharon Cain, female    DOB: Jan 24, 1956, 55 y.o.   MRN: [id number]  HPI Ms. Sharon Cain is a 55 year old female with a history of allergic rhinitis presenting for a hospital follow-up after going to the ED for what she described as a "very bad cold." She notes right ear pain and persistent nasal drainage, but recently had an acute flare that was difficult to tolerate. Therefore, she went to the hospital, where she was diagnosed with a viral infection and discharged. Presently, she has pain in her right ear that is non-radiating. She also notes sinus pain and mild persistent cough. Her sore throat has resolved. She subjectively states that she may have had a fever or hot flashes from menopause. She notes that many of her family members have had viral illnesses, and that she has had the flu vaccine. She denies myalgias or fatigue.    Review of Systems Negative unless stated above.     Objective:   Physical Exam  Constitutional: She appears well-developed and well-nourished. No distress.  HENT:  Head: Normocephalic and atraumatic.  Right Ear: Tympanic membrane, external ear and ear canal normal.  Left Ear: Tympanic membrane, external ear and ear canal normal.  Nose: Nose normal.  Mouth/Throat: Oropharynx is clear and moist and mucous membranes are normal.  Cardiovascular: Normal rate, regular rhythm and normal heart sounds.   Pulmonary/Chest: Effort normal and breath sounds normal.     Tympanometry: non definitive for middle ear effusion  BP 117/77  Pulse 69  Wt 198 lb 8 oz (90.039 kg)        Assessment & Plan:  Mrs. Sharon Cain is a 55 year old presenting with right-sided ear pain of several months duration, which likely represents a serous effusion of the middle ear.

## 2011-01-22 ENCOUNTER — Other Ambulatory Visit: Payer: Self-pay | Admitting: Family Medicine

## 2011-01-22 DIAGNOSIS — F519 Sleep disorder not due to a substance or known physiological condition, unspecified: Secondary | ICD-10-CM

## 2011-01-25 ENCOUNTER — Other Ambulatory Visit: Payer: Self-pay | Admitting: Family Medicine

## 2011-01-25 NOTE — Telephone Encounter (Signed)
Will forward to Dr. Aviva Signs  who is checking intern's messages for refills this month.

## 2011-01-28 NOTE — Telephone Encounter (Signed)
Refill request for dr. Yetta Numbers patient

## 2011-01-29 ENCOUNTER — Telehealth: Payer: Self-pay | Admitting: Family Medicine

## 2011-01-29 NOTE — Telephone Encounter (Signed)
Will fwd. To PCP for refill. .Sharon Cain  

## 2011-01-29 NOTE — Telephone Encounter (Signed)
Ms. Sharon Cain is calling to find out why her Rx for Ambien was denied.  She said she never got the Rx from 01/22/11.

## 2011-01-29 NOTE — Telephone Encounter (Signed)
rx  for generic Remus Loffler called to pharmacy.

## 2011-01-30 NOTE — Telephone Encounter (Signed)
My records indicate that the she a prescription for Ambien sent on 01/22/11 and 01/25/11. I am not sure the current status of these prescriptions and why they have not been obtained by Sharon Cain. It will take me a while to figure this out.

## 2011-02-27 ENCOUNTER — Other Ambulatory Visit: Payer: Self-pay | Admitting: Family Medicine

## 2011-02-27 DIAGNOSIS — E785 Hyperlipidemia, unspecified: Secondary | ICD-10-CM

## 2011-02-27 MED ORDER — SIMVASTATIN 20 MG PO TABS: 20.0000 mg | ORAL_TABLET | Freq: Every day | ORAL | Status: DC

## 2011-02-27 MED ORDER — ZOLPIDEM TARTRATE 5 MG PO TABS: 5.0000 mg | ORAL_TABLET | Freq: Every evening | ORAL | Status: DC | PRN

## 2011-02-27 NOTE — Telephone Encounter (Signed)
Pt checking status of rx for Remus Loffler, says she called it in to [facility] last week, the request was in Dr. Freada Bergeron box and dated 12/3, pt only has enough for tonight and tomorrow and will need asap.

## 2011-02-27 NOTE — Telephone Encounter (Signed)
Thank you for forwarding me this message. Sharon Cain's request. I have taken care of it.

## 2011-02-27 NOTE — Telephone Encounter (Signed)
Forward to PCP for refill request 

## 2011-04-24 ENCOUNTER — Other Ambulatory Visit: Payer: Self-pay | Admitting: *Deleted

## 2011-04-24 MED ORDER — RANITIDINE HCL 150 MG PO TABS: 150.0000 mg | ORAL_TABLET | Freq: Every day | ORAL | Status: DC

## 2011-05-02 ENCOUNTER — Telehealth: Payer: Self-pay | Admitting: Family Medicine

## 2011-05-02 NOTE — Telephone Encounter (Signed)
Fwd. To Dr.Williamson. Lorenda Hatchet, Renato Battles Please see message on pt: Sharon Cain

## 2011-05-02 NOTE — Telephone Encounter (Signed)
To red team. Sharon Cain  

## 2011-05-02 NOTE — Telephone Encounter (Signed)
Ms. Sharon Cain is requesting a letter stating that she have to stay with her mom due to her mother requiring her assistance.  She said there was a letter given to her before by Dr. Jeanice Lim stating the same thing.   She would like to have this by tomorrow if possible.

## 2011-05-03 ENCOUNTER — Encounter: Payer: Self-pay | Admitting: Family Medicine

## 2011-05-23 ENCOUNTER — Other Ambulatory Visit: Payer: Self-pay | Admitting: Family Medicine

## 2011-05-23 DIAGNOSIS — F519 Sleep disorder not due to a substance or known physiological condition, unspecified: Secondary | ICD-10-CM

## 2011-05-23 NOTE — Telephone Encounter (Signed)
Refill request

## 2011-05-24 ENCOUNTER — Other Ambulatory Visit: Payer: Self-pay | Admitting: Emergency Medicine

## 2011-05-24 MED ORDER — RANITIDINE HCL 150 MG PO TABS: 150.0000 mg | ORAL_TABLET | Freq: Every day | ORAL | Status: DC

## 2011-05-24 NOTE — Telephone Encounter (Signed)
Patient needs an appointment with PCP before further refills of ranitidine.

## 2011-05-24 NOTE — Telephone Encounter (Signed)
Sharon Cain,   I am in the hospital today and will be switching to nights on Monday. Therefore, I will not be in the clinic at all next week. Can you print and sign a prescription for Zolpidem 5 mg PO, 30 tablets, with zero refills. Also, please call the patient to notify her to pick up her prescription on Monday and that she needs to be seen in the office within the next 30 days to talk about her insomnia this medication is not indicated for long term use.   Thank you very much.   Zenaida Niece

## 2011-05-27 NOTE — Telephone Encounter (Signed)
Ambien 5mg  #30 with no refills called to [facility] [phone].  Per Dr. Gevena Mart needs an appointment to discuss insomnia.  Ileana Ladd

## 2011-06-24 ENCOUNTER — Other Ambulatory Visit: Payer: Self-pay | Admitting: Family Medicine

## 2011-06-24 DIAGNOSIS — F519 Sleep disorder not due to a substance or known physiological condition, unspecified: Secondary | ICD-10-CM

## 2011-06-24 MED ORDER — ZOLPIDEM TARTRATE 5 MG PO TABS: 5.0000 mg | ORAL_TABLET | Freq: Every evening | ORAL | Status: DC | PRN

## 2011-06-28 ENCOUNTER — Telehealth: Payer: Self-pay | Admitting: Family Medicine

## 2011-06-28 NOTE — Telephone Encounter (Signed)
Patient calling because Rx for Ambien is not at [facility] on [address].  It shows that it was printed on 4/1 and the patient said it was to be faxed to the pharmacy.

## 2011-06-28 NOTE — Telephone Encounter (Signed)
Rx is noted as  phoned in but pharmacy does not have. Called and verbally gave to the pharmacist RX for Ambien.

## 2011-08-09 ENCOUNTER — Ambulatory Visit: Payer: BC Managed Care – PPO | Admitting: Family Medicine

## 2011-08-14 ENCOUNTER — Ambulatory Visit (INDEPENDENT_AMBULATORY_CARE_PROVIDER_SITE_OTHER): Payer: BC Managed Care – PPO | Admitting: Family Medicine

## 2011-08-14 ENCOUNTER — Encounter: Payer: Self-pay | Admitting: Family Medicine

## 2011-08-14 VITALS — BP 120/70 | HR 68 | Ht 61.0 in | Wt 200.0 lb

## 2011-08-14 DIAGNOSIS — E669 Obesity, unspecified: Secondary | ICD-10-CM

## 2011-08-14 DIAGNOSIS — R079 Chest pain, unspecified: Secondary | ICD-10-CM

## 2011-08-14 NOTE — Patient Instructions (Addendum)
Dear Mrs. Sharon Cain,   Thank you for coming to clinic today. Please read below regarding the issues that we discussed.   Weight Loss - I am proud of you for taking on this challenge. This weight gain did not happen overnight, so we are not going to correct it overnight. Therefore, I encourage patience! Let's try to gather information over the next 2 weeks about certain habits. The first is food journal - please record what you ate and drank each day and what you were doing when this happened. Do it for 3 days, and we will have lots of good information. Also, you mentioned getting pedometer. This is a good idea to track your steps. This will give [place] an idea of how much you are moving throughout the week. Another option is a smart phone app called My Fitness Pal.    Please follow up in clinic in 2 weeks . Please call earlier if you have any questions or concerns.   Sincerely,   Dr. Clinton Sawyer

## 2011-08-22 ENCOUNTER — Encounter: Payer: Self-pay | Admitting: Family Medicine

## 2011-08-22 NOTE — Assessment & Plan Note (Signed)
Body mass index is 37.79 kg/(m^2). Ideal weight 100-130 lbs based on BMI 18-24 Patient's Goal weight is 170-180 lsb (20-30 lb weight loss)  We talked extensively about information gathering. Mrs. Sharon Cain will perform a food journal and get a pedometer to assess actual diet and exercise weekly. Thereafter, we will start a true weight loss plan.

## 2011-08-22 NOTE — Progress Notes (Signed)
  Subjective:    Patient ID: Sharon Cain, female    DOB: 09-Jan-1956, 56 y.o.   MRN: [id number]  HPI  [name] presented today with concern about her weight and desire to lose weight. 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, nature  > At work she is snacking constantly  > If feeling irritable or fatigued, then she eats  > does not drink soda, only water and crystal light  > feels like her appetite has increased lately and does not feel satiety  Exercise: no intentional exercise; walks her dog several times a week for duration of 30 minutes each time but distance unknown Weight Loss Goal: would like to weight between 170-180 lbs History of weight loss: no history of trying to lose weight, never kept food journal  Strategies considered for weight loss: getting a pedometer to measure steps   Review of Systems Denies depression and anxiety     Objective:   Physical Exam BP 120/70  Pulse 68  Ht 5\' 1"  (1.549 m)  Wt 200 lb (90.719 kg)  BMI 37.79 kg/m2  Ideal Body Weight based upon BMI: 100-130 lbs  Gen: obese middle aged WF, very pleasant and conversant  Cardiac: RRR, no murmurs      Assessment & Plan:  Entire visit spent gathering information about her eating habits, exercise habits, and confounding factors. We decided to take the weight loss plan in distinct phases. This phase is only information gathering. Part this includes a 3 day food journal and a 7 days pedometer recording. After she has recorded this information, we will focus on strategies and goals for weight loss. She will also download My fitness pal for her smartphone.

## 2011-10-23 ENCOUNTER — Other Ambulatory Visit: Payer: Self-pay | Admitting: Family Medicine

## 2011-10-23 DIAGNOSIS — F519 Sleep disorder not due to a substance or known physiological condition, unspecified: Secondary | ICD-10-CM

## 2011-10-23 MED ORDER — ZOLPIDEM TARTRATE 5 MG PO TABS: 5.0000 mg | ORAL_TABLET | Freq: Every evening | ORAL | Status: DC | PRN

## 2011-11-08 ENCOUNTER — Other Ambulatory Visit: Payer: Self-pay | Admitting: Family Medicine

## 2011-11-08 DIAGNOSIS — F519 Sleep disorder not due to a substance or known physiological condition, unspecified: Secondary | ICD-10-CM

## 2011-11-08 MED ORDER — ZOLPIDEM TARTRATE 5 MG PO TABS: 5.0000 mg | ORAL_TABLET | Freq: Every evening | ORAL | Status: DC | PRN

## 2011-12-20 ENCOUNTER — Other Ambulatory Visit: Payer: Self-pay | Admitting: *Deleted

## 2011-12-20 MED ORDER — HYDROCHLOROTHIAZIDE 25 MG PO TABS: 25.0000 mg | ORAL_TABLET | Freq: Every day | ORAL | Status: DC

## 2012-03-06 ENCOUNTER — Other Ambulatory Visit: Payer: Self-pay | Admitting: *Deleted

## 2012-03-06 DIAGNOSIS — E785 Hyperlipidemia, unspecified: Secondary | ICD-10-CM

## 2012-03-06 MED ORDER — SIMVASTATIN 20 MG PO TABS: 20.0000 mg | ORAL_TABLET | Freq: Every day | ORAL | Status: DC

## 2012-03-09 ENCOUNTER — Other Ambulatory Visit: Payer: Self-pay | Admitting: *Deleted

## 2012-03-09 MED ORDER — HYDROCHLOROTHIAZIDE 25 MG PO TABS: 25.0000 mg | ORAL_TABLET | Freq: Every day | ORAL | Status: DC

## 2012-04-30 ENCOUNTER — Encounter: Payer: Self-pay | Admitting: Family Medicine

## 2012-04-30 ENCOUNTER — Ambulatory Visit (INDEPENDENT_AMBULATORY_CARE_PROVIDER_SITE_OTHER): Payer: BC Managed Care – PPO | Admitting: Family Medicine

## 2012-04-30 VITALS — BP 110/73 | HR 72 | Ht 61.0 in | Wt 208.0 lb

## 2012-04-30 DIAGNOSIS — E785 Hyperlipidemia, unspecified: Secondary | ICD-10-CM

## 2012-04-30 DIAGNOSIS — I10 Essential (primary) hypertension: Secondary | ICD-10-CM

## 2012-04-30 NOTE — Patient Instructions (Addendum)
Dear [name],   Please continue the Simvastatin.  Please stop the hydrochlorothiazide for one month and let's see what your blood pressure does Please come back for a lab visit when you are fasting to check your cholesterol, blood count and electrolytes.  Return one week after your lab draw for a pap smear.  Call the breast imaging center to set up a mammogram.   Take Care,   Dr. Clinton Sawyer

## 2012-04-30 NOTE — Progress Notes (Signed)
  Subjective:    Patient ID: Sharon Cain, female    DOB: 06-29-1955, 57 y.o.   MRN: [id number]  HPI  57 year old F who presents for hypertension follow up and general health care assessment.   1. Hypertension  Home BP monitoring:  BP Readings from Last 3 Encounters:  04/30/12 110/73  08/14/11 120/70  12/28/10 117/77    Prescribed meds: HCTZ 25 mg; patient would like to come off HTN medication if possible   Hypertension ROS: taking medications as instructed, no medication side effects noted, no TIA's, no chest pain on exertion, notes stable dyspnea on exertion, no change and no swelling of ankles  2. Cervical Cancer Screening - Can not remember last Pap. No post-menopausal bleeding. Was told that she needs to schedule.   3. Breast Cancer Screening - No mammogram since 2011. Denies any new lumps or masses.   3. Hyperlipidemia - Has not had cholesterol checked since August 2012. Still taking zocor daily.    Social - Patient promoted to team leader at [employer]   Review of Systems See HPI    Objective:   Physical Exam  BP 110/73  Pulse 72  Ht 5\' 1"  (1.549 m)  Wt 208 lb (94.348 kg)  BMI 39.32 kg/m2 Gen: well appearing, middle aged WF, obese body habitus CV: RRR, no murmurs, rubs or gallops; no carotid stenosis, no JVD Pulm: CTA-B Extrem: no edema     Assessment & Plan:  Patient with well controlled HTN who needs follow up for cervical cancer screening and hyperlipidemia management.

## 2012-05-03 NOTE — Assessment & Plan Note (Signed)
Patient requesting to come off BP meds. I did not start these medications and cannot find any elevated BP in her chart. Given that she has always been normotensive and does not have a history of CVA, CAD, or DM, I am comfortable with a trial off medication. She will stop HCTZ for one month and reassess.

## 2012-05-05 ENCOUNTER — Other Ambulatory Visit: Payer: Self-pay | Admitting: Family Medicine

## 2012-05-05 DIAGNOSIS — Z1231 Encounter for screening mammogram for malignant neoplasm of breast: Secondary | ICD-10-CM

## 2012-05-20 ENCOUNTER — Other Ambulatory Visit: Payer: BC Managed Care – PPO

## 2012-05-20 DIAGNOSIS — E785 Hyperlipidemia, unspecified: Secondary | ICD-10-CM

## 2012-05-20 LAB — LIPID PANEL
HDL: 49 mg/dL (ref >39)
LDL Cholesterol: 99 mg/dL (ref 0–99)
Total CHOL/HDL Ratio: 3.5 Ratio
VLDL: 24 mg/dL (ref 0–40)

## 2012-05-20 LAB — COMPREHENSIVE METABOLIC PANEL
ALT: 24 U/L (ref 0–35)
AST: 17 U/L (ref 0–37)
Albumin: 4.4 g/dL (ref 3.5–5.2)
Alkaline Phosphatase: 52 U/L (ref 39–117)
Calcium: 10 mg/dL (ref 8.4–10.5)
Chloride: 106 mEq/L (ref 96–112)
Potassium: 4 mEq/L (ref 3.5–5.3)
Sodium: 142 mEq/L (ref 135–145)

## 2012-05-20 LAB — CBC
Platelets: 205 10*3/uL (ref 150–400)
RDW: 14.1 % (ref 11.5–15.5)
WBC: 6 10*3/uL (ref 4.0–10.5)

## 2012-05-20 NOTE — Progress Notes (Signed)
CMP,CBC AND FLP DONE TODAY Tu Shimmel 

## 2012-05-22 ENCOUNTER — Ambulatory Visit
Admission: RE | Admit: 2012-05-22 | Discharge: 2012-05-22 | Disposition: A | Discharge status: Home / Self Care | Payer: BC Managed Care – PPO | Source: Ambulatory Visit | Attending: Family Medicine | Admitting: Family Medicine

## 2012-05-22 DIAGNOSIS — Z1231 Encounter for screening mammogram for malignant neoplasm of breast: Secondary | ICD-10-CM

## 2012-05-29 ENCOUNTER — Ambulatory Visit: Payer: BC Managed Care – PPO | Admitting: Family Medicine

## 2012-06-10 ENCOUNTER — Encounter: Payer: Self-pay | Admitting: Family Medicine

## 2012-06-10 ENCOUNTER — Ambulatory Visit (INDEPENDENT_AMBULATORY_CARE_PROVIDER_SITE_OTHER): Payer: BC Managed Care – PPO | Admitting: Family Medicine

## 2012-06-10 VITALS — BP 132/78 | HR 65 | Temp 98.5°F | Ht 61.0 in | Wt 207.4 lb

## 2012-06-10 DIAGNOSIS — I10 Essential (primary) hypertension: Secondary | ICD-10-CM

## 2012-06-10 DIAGNOSIS — E782 Mixed hyperlipidemia: Secondary | ICD-10-CM

## 2012-06-10 NOTE — Patient Instructions (Addendum)
Ms. Sharon Cain,   [name] are going great with your blood pressure, so stay off the HCTZ. Also, your cholesterol is normal, so you don't need to take any medication right now. We will need to check that in one year.   Please exercise 5 days, 30 minutes a day if possible.   Come back soon for a Pap smear.   Sincerely,   Dr. Clinton Sawyer

## 2012-06-10 NOTE — Progress Notes (Signed)
  Subjective:    Patient ID: Sharon Cain, female    DOB: 11-14-1955, 57 y.o.   MRN: [id number]  HPI  57 year old F with history of HTN and hyperlipidemia who presented for follow up.  1. Hypertension - Previously taking HCTZ, but never had documented elevated blood pressure. Therefore, she was taken off of medication in January for trial period. Since that time, she denies chest pain, SOB, numbness or tingling, TIA, ankle swellin  2. HLD - patient previously diagnosed with HLD and taking Zocor. However, she stopped taking that 2 months ago and needed repeat lipid panel to assess CVD risk. Has had negative cath in the past. Never had positive stress test. No history of stroke.   Review of Systems seasonal depression     Objective:   Physical Exam BP 132/78  Pulse 65  Temp(Src) 98.5 F (36.9 C) (Oral)  Ht 5\' 1"  (1.549 m)  Wt 207 lb 6 oz (94.065 kg)  BMI 39.2 kg/m2  Gen: obese, middle age WF, non ill appearing CV: RRR, no murmurs Lungs: CTA-B Abd: soft, NDNT, no abdominal bruits  Lipid Panel     Component Value Date/Time   CHOL 172 05/20/2012 0841   TRIG 121 05/20/2012 0841   HDL 49 05/20/2012 0841   CHOLHDL 3.5 05/20/2012 0841   VLDL 24 05/20/2012 0841   LDLCALC 99 05/20/2012 0841   Current 10 year CVD risk: 2.5%     Assessment & Plan:  No current objective data to support need for HTN or HLD medications. Patient counseled extensively about need for exercise and improved weight control.

## 2012-06-11 NOTE — Assessment & Plan Note (Signed)
Stop all HTN meds as not needed at this point.

## 2012-06-11 NOTE — Assessment & Plan Note (Signed)
According to ATP 4 guidelines, patient does not meet criteria (10 year risk >= 7.5%) for benefit from statin therapy. Reassess in 1 year. Patient knows that exercise and diet are essential to minimizing healthy CVD risk factors.

## 2012-07-08 ENCOUNTER — Ambulatory Visit: Payer: BC Managed Care – PPO | Admitting: Family Medicine

## 2012-08-18 ENCOUNTER — Ambulatory Visit: Payer: BC Managed Care – PPO | Admitting: Family Medicine

## 2012-08-21 ENCOUNTER — Encounter: Payer: Self-pay | Admitting: Family Medicine

## 2012-08-21 ENCOUNTER — Ambulatory Visit (INDEPENDENT_AMBULATORY_CARE_PROVIDER_SITE_OTHER): Payer: BC Managed Care – PPO | Admitting: Family Medicine

## 2012-08-21 ENCOUNTER — Other Ambulatory Visit (HOSPITAL_COMMUNITY)
Admission: RE | Admit: 2012-08-21 | Discharge: 2012-08-21 | Disposition: A | Discharge status: Home / Self Care | Payer: BC Managed Care – PPO | Source: Ambulatory Visit | Attending: Family Medicine | Admitting: Family Medicine

## 2012-08-21 VITALS — BP 114/76 | HR 65 | Ht 61.0 in | Wt 198.4 lb

## 2012-08-21 DIAGNOSIS — Z23 Encounter for immunization: Secondary | ICD-10-CM

## 2012-08-21 DIAGNOSIS — Z124 Encounter for screening for malignant neoplasm of cervix: Secondary | ICD-10-CM

## 2012-08-21 DIAGNOSIS — Z01419 Encounter for gynecological examination (general) (routine) without abnormal findings: Secondary | ICD-10-CM | POA: Insufficient documentation

## 2012-08-21 MED ORDER — TETANUS-DIPHTH-ACELL PERTUSSIS 5-2.5-18.5 LF-MCG/0.5 IM SUSP: 0.5000 mL | Freq: Once | INTRAMUSCULAR | Status: DC

## 2012-08-21 NOTE — Patient Instructions (Addendum)
Ms. Sharon Cain,   Congratulation on the new baby and congratulations on the weight loss. Do your best to get regular exercise during these warm sunny days. I will let you know about the results of the Pap and if all is going well then you don't need to return until February of 2015. However, I do recommend getting a flu shot next fall. Come back earlier if you need anything.   Take Care,   Dr. Clinton Sawyer

## 2012-08-24 NOTE — Progress Notes (Signed)
  Subjective:    Patient ID: Sharon Cain, female    DOB: 10-20-1955, 57 y.o.   MRN: [id number]  HPI  57 year old F who presents for her annual physical. All labs performed in February 2014 and within normal limit. Patient was taken of HTN medication and this dropped her 20 year CV risk below 7.5%, so she was taken off her statin also. Mammography performed in March 2014 and was normal.   She denies any current issues with her health.     Review of Systems Negative     Objective:   Physical Exam  BP 114/76  Pulse 65  Ht 5\' 1"  (1.549 m)  Wt 198 lb 6.4 oz (89.994 kg)  BMI 37.51 kg/m2 Gen: middle aged white female, well appearing, NAD, pleasant and conversant HEENT: NCAT, PERRLA, EOMI, OP clear and moist, no lymphadenopathy, no thyroid tenderness, enlargement, or nodules CV: RRR, no m/r/g, no JVD or carotid bruits Pulm: normal WOB, CTA-B Abd: soft, NDNT, NABS Extremities: no edema or joint tenderness Skin: warm, dry, no rashes Neuro/Psych: A&Ox4, normal affect, speech, and thought content GU: External: no lesions Vagina: no blood in vault Cervix: no lesion; no mucopurulent d/c Uterus: small, mobile Adnexa: no masses; non tender      Assessment & Plan:  Stable health status. No changes to care based upon physical. F/u results of pap smear.

## 2012-08-31 ENCOUNTER — Encounter: Payer: Self-pay | Admitting: Family Medicine

## 2012-12-24 ENCOUNTER — Encounter: Payer: Self-pay | Admitting: Family Medicine

## 2012-12-24 ENCOUNTER — Ambulatory Visit (INDEPENDENT_AMBULATORY_CARE_PROVIDER_SITE_OTHER): Payer: BC Managed Care – PPO | Admitting: Family Medicine

## 2012-12-24 VITALS — BP 127/80 | HR 71 | Temp 98.5°F | Ht 61.0 in | Wt 182.2 lb

## 2012-12-24 DIAGNOSIS — Z23 Encounter for immunization: Secondary | ICD-10-CM

## 2012-12-24 DIAGNOSIS — M79609 Pain in unspecified limb: Secondary | ICD-10-CM

## 2012-12-24 DIAGNOSIS — Z720 Tobacco use: Secondary | ICD-10-CM

## 2012-12-24 DIAGNOSIS — F17200 Nicotine dependence, unspecified, uncomplicated: Secondary | ICD-10-CM

## 2012-12-24 DIAGNOSIS — E669 Obesity, unspecified: Secondary | ICD-10-CM

## 2012-12-24 DIAGNOSIS — M79605 Pain in left leg: Secondary | ICD-10-CM

## 2012-12-24 DIAGNOSIS — M5432 Sciatica, left side: Secondary | ICD-10-CM | POA: Insufficient documentation

## 2012-12-24 NOTE — Assessment & Plan Note (Signed)
Assessment: patient now smoking half pack per day Plan: patient counseled of importance of tobacco cessation, which she acknowledges; I encouraged her to choose a quit date and a strategy and to let me know; she will do so

## 2012-12-24 NOTE — Patient Instructions (Addendum)
Dear Ms. Sharon Cain,   It was nice to see you today. Please read below about the issues that we discussed.   Sciatica - Continue with your exercises and we will consider with gabapentin 300 mg at night as needed of the pain becomes severe.   Tobacco - Please set a stop date and let me know what I can do to help. We both know that you can do this. I am here for do.   Weight Loss - Congratulations on the weight loss. Keep it up.    Sincerely,   Dr. Clinton Sawyer

## 2012-12-24 NOTE — Assessment & Plan Note (Signed)
Assessment: symptoms consistent with very mild sciatica, but no evidence of radiculopathy on exam Plan: patient to continue back extension exercises as well as naproxen 220 mg daily; patient will consider using gabapentin 300 mg each bedtime if pain becomes persistent

## 2012-12-24 NOTE — Assessment & Plan Note (Signed)
Assessement: approximately 30 pound weight loss in 9 months Plan: patient congratulated for weight loss and instructed that she may use over-the-counter product as long as her wasn't causing any side effects, which he does not appear to be; encouraged to exercise regularly outside of work

## 2012-12-24 NOTE — Progress Notes (Signed)
  Subjective:    Patient ID: Sharon Cain, female    DOB: 1956/03/23, 57 y.o.   MRN: [id number]  HPI  57 year old female with obesity and tobacco use disorder who presents for followup.  1. Left leg pain - starts in left buttocks and radiates down left lateral leg past her knee, worsening over last year, no recent falls or injuries This is consistent her previous bouts of sciatica, which has been occurring for approximately 10 years, patient has never taken medication for this issue but did go to physical therapy which he feels like provides the most relief    2. Tobacco Use - patient has started smoking again in the last few months currently at half pack per day, she leads a sugar stress, she is confident she can quit, but does not know what she would like to; she would like use a strategy of nicotine patches; asked what I can do to help and she says "just your vote of confidence"  3. Obesity - Patient notes that she is taking green coffee extract which she buys over the counter, it is decreasing appetite, the does not cause tachycardia sweating, she is not but it worsened her difficulty sleeping, she denies any side effects of the medication, she also denies any regular exercise because she feels too tired at the end of the day after she works as a Production designer, theatre/television/film in Dance movement psychotherapist Readings from Last 5 Encounters:  12/24/12 182 lb 3.2 oz (82.645 kg)  08/21/12 198 lb 6.4 oz (89.994 kg)  06/10/12 207 lb 6 oz (94.065 kg)  04/30/12 208 lb (94.348 kg)  08/14/11 200 lb (90.719 kg)      Review of Systems See HPI    Objective:   Physical Exam BP 127/80  Pulse 71  Temp(Src) 98.5 F (36.9 C) (Oral)  Ht 5\' 1"  (1.549 m)  Wt 182 lb 3.2 oz (82.645 kg)  BMI 34.44 kg/m2  Obese middle age female, pleasant and conversant  Back:  Appearance: sciolosis no Palpation: tenderness of paraspinal muscles no, spinous process no; pelvis no  Flexion: normal Extension: normal   Hip: left Appearance: no  swelling Palpation: tenderness of greater trochanter no Rotation Reduced: internal no, external no   Neuro: Strength hip flexion 5/5, hip abduction 5/5, hip adduction 5/5,  knee extension 5/5, knee flexion 5/5, dorsiflexion 5/5, plantar flexion 5/5 Reflexes: patella 2/2 Bilateral  Achilles 2/2 Bilateral Straight Leg Raise: negative Sensation to light touch: equal bilaterally      Assessment & Plan:

## 2013-05-03 ENCOUNTER — Other Ambulatory Visit: Payer: Self-pay

## 2013-05-03 DIAGNOSIS — Z1231 Encounter for screening mammogram for malignant neoplasm of breast: Secondary | ICD-10-CM

## 2013-05-24 ENCOUNTER — Ambulatory Visit
Admission: RE | Admit: 2013-05-24 | Discharge: 2013-05-24 | Disposition: A | Discharge status: Home / Self Care | Payer: BC Managed Care – PPO | Source: Ambulatory Visit

## 2013-05-24 DIAGNOSIS — Z1231 Encounter for screening mammogram for malignant neoplasm of breast: Secondary | ICD-10-CM

## 2013-06-04 ENCOUNTER — Ambulatory Visit (HOSPITAL_COMMUNITY)
Admission: RE | Admit: 2013-06-04 | Discharge: 2013-06-04 | Disposition: A | Discharge status: Home / Self Care | Payer: BC Managed Care – PPO | Source: Ambulatory Visit | Attending: Family Medicine | Admitting: Family Medicine

## 2013-06-04 ENCOUNTER — Ambulatory Visit (INDEPENDENT_AMBULATORY_CARE_PROVIDER_SITE_OTHER): Payer: BC Managed Care – PPO | Admitting: Family Medicine

## 2013-06-04 ENCOUNTER — Encounter: Payer: Self-pay | Admitting: Family Medicine

## 2013-06-04 VITALS — BP 147/83 | HR 63 | Temp 98.8°F | Ht 61.0 in | Wt 162.4 lb

## 2013-06-04 DIAGNOSIS — R079 Chest pain, unspecified: Secondary | ICD-10-CM | POA: Insufficient documentation

## 2013-06-04 DIAGNOSIS — R0602 Shortness of breath: Secondary | ICD-10-CM | POA: Insufficient documentation

## 2013-06-04 LAB — D-DIMER, QUANTITATIVE: D-Dimer, Quant: 0.38 ug/mL-FEU (ref 0.00–0.48)

## 2013-06-04 LAB — TROPONIN I

## 2013-06-04 LAB — POCT GLYCOSYLATED HEMOGLOBIN (HGB A1C): HEMOGLOBIN A1C: 5.3

## 2013-06-04 NOTE — Assessment & Plan Note (Signed)
Pertinent S&O  Intermittent Right-sided sharp chest pain associated with SOB x 2 wks  40 pack year smoking hx  Stress teat 2012: Normal  No HTN, DM  Hx of anxiety and reports she has been under a lot of stress lately  Assessment  No immediate concern for for Cardiac etiology (EKG unchanged from previous) or PNA/COPD exacerbation (lungs clear, no cough or inc sputum)  DDx: Anxiety vs MSK etiology  Plan  Likely anxiety related: She intends to follow-up with PCP for this  Will obtain Troponins and proBNP to rule out cardiac  Obtain ddimer as low risk for PE  Obtain CXR:

## 2013-06-04 NOTE — Patient Instructions (Signed)
It was great seeing you today.   1. Your  EKG is not concerning for cardiac related chest pain. However, I will get some labs and a chest xray to further evaluate you pain. If these are abnormal you will get a call from our clinic.  2. If you chest pain worsens or you develop new symptoms please call the clinic.    Please bring all your medications to every doctors visit  Sign up for My Chart to have easy access to your labs results, and communication with your Primary care physician.  Next Appointment  Please call to make an appointment with Dr Maricela Bo as needed  I look forward to talking with you again at our next visit. If you have any questions or concerns before then, please call the clinic at [phone].  Take Care,   Dr Phill Myron

## 2013-06-04 NOTE — Progress Notes (Signed)
  Patient name: Sharon Cain MRN [id number]  Date of birth: [birth year]  CC & HPI:  Sharon Cain is a 58 y.o. female presenting today for chest pain. She report some SOB and "not feeling herself" for the last two weeks. Then two days ago she started having sharp intermittent right sided chest pain. The pain comes about periodically (she can't say how often), and is not associated with exertion, position changes, deep breaths, or food. It only last for a few seconds, unless she is smoking then it last for several minutes. She denies any diaphoresis, PND, orthopnea or LE edema. Denies any increase in sputum production or purulence. Denies any calf pain, hx of CA, or immobilization. Denies any heart burn, reflux or sour taste in mouth.  She does report being under a lot of stress lately and intends to follow-up with her PCP about this.   She denies any history of MI or clots. She does have OSA and has been using her CPAP regularly. She a FHx of HF in her mother  Red Flags Worse with exertion: no  Recent Immobility: no  Cancer history: no  Tearing/radiation to back: no   ROS: See HPI above otherwise negative.  Medical & Surgical Hx:  Reviewed.  Medications & Allergies: Reviewed & Updated - see associated section Social History: Reviewed:   Objective Findings:  Vitals: BP 147/83  Pulse 63  Temp(Src) 98.8 F (37.1 C) (Oral)  Ht 5\' 1"  (1.549 m)  Wt 162 lb 6.4 oz (73.664 kg)  BMI 30.70 kg/m2  Gen: NAD CV: RRR w/o m/r/g, pulses +2 b/l Chest: No rash, to point tenderness Resp: CTAB w/ normal respiratory effort GI: No skin changes; No tenderness or masses Lower Ext: No skin changes; No edema; distal pulses intact; Calves nontender  Assessment & Plan:   Please See Problem Focused Assessment & Plan

## 2013-06-05 LAB — PRO B NATRIURETIC PEPTIDE: Pro B Natriuretic peptide (BNP): 275.9 pg/mL — ABNORMAL HIGH (ref <126)

## 2013-06-05 LAB — TSH: TSH: 1.619 u[IU]/mL (ref 0.350–4.500)

## 2013-06-24 ENCOUNTER — Encounter: Payer: Self-pay | Admitting: Family Medicine

## 2013-06-24 ENCOUNTER — Ambulatory Visit (INDEPENDENT_AMBULATORY_CARE_PROVIDER_SITE_OTHER): Payer: BC Managed Care – PPO | Admitting: Family Medicine

## 2013-06-24 VITALS — BP 129/84 | HR 88 | Ht 61.0 in | Wt 160.0 lb

## 2013-06-24 DIAGNOSIS — R079 Chest pain, unspecified: Secondary | ICD-10-CM

## 2013-06-24 DIAGNOSIS — F411 Generalized anxiety disorder: Secondary | ICD-10-CM

## 2013-06-24 MED ORDER — BUSPIRONE HCL 10 MG PO TABS: 10.0000 mg | ORAL_TABLET | Freq: Three times a day (TID) | ORAL | Status: DC | PRN

## 2013-06-24 NOTE — Progress Notes (Signed)
   Subjective:    Patient ID: Sharon Cain, female    DOB: [birth year], 58 y.o.   MRN: [id number]  HPI  58 year old F who f/u chest pain and has diarrhea.   Chest pain - evaluated for this on 06/04/13 and thought to be related to stress; last pro BNP < 0.30, and Pro BNP 275.90, d dimer 0.38, EKG showed no acute ischmia and unchanged since last once; Pt continues to have chest pain for several seconds intermittently, central and non radiating worse at work; stressors include family dynamic with numerous people living together as well as worsening finances   Diarrhea - 1 day, no blood, no fever, no vomiting; has resolved with immodium  Review of Systems See HPI     Objective:   Physical Exam BP 129/84  Pulse 88  Ht 5\' 1"  (1.549 m)  Wt 160 lb (72.576 kg)  BMI 30.25 kg/m2  Gen: overweight middle age F, non ill appearing CV: RRR, no murmurs Pulm: CTA-B Psych: normal affect, thought content and speech pattern  GAD - 12 (moderate)      Assessment & Plan:  Diarrhea - appears limited without red flag for infectious etiology; no further eval

## 2013-06-24 NOTE — Patient Instructions (Signed)
Ms. Sharon Cain to see you. Please use the buspar 2-3 times per day for anxiety. Come back in one month for a check up.   Happy [name]!  Dr. Maricela Bo

## 2013-06-24 NOTE — Assessment & Plan Note (Signed)
A: negative work up for cardiac cause, clearly stress playing a role P: start buspar 10 mg TID for anxiety, f/u 1 month

## 2013-09-17 ENCOUNTER — Encounter: Payer: BC Managed Care – PPO | Admitting: Family Medicine

## 2013-12-24 ENCOUNTER — Ambulatory Visit (INDEPENDENT_AMBULATORY_CARE_PROVIDER_SITE_OTHER): Payer: BC Managed Care – PPO | Admitting: Family Medicine

## 2013-12-24 ENCOUNTER — Ambulatory Visit (HOSPITAL_COMMUNITY)
Admission: RE | Admit: 2013-12-24 | Discharge: 2013-12-24 | Disposition: A | Discharge status: Home / Self Care | Payer: BC Managed Care – PPO | Source: Ambulatory Visit | Attending: Family Medicine | Admitting: Family Medicine

## 2013-12-24 ENCOUNTER — Encounter: Payer: Self-pay | Admitting: Family Medicine

## 2013-12-24 VITALS — BP 150/90 | HR 60 | Ht 61.0 in | Wt 153.0 lb

## 2013-12-24 DIAGNOSIS — Z720 Tobacco use: Secondary | ICD-10-CM

## 2013-12-24 DIAGNOSIS — M5442 Lumbago with sciatica, left side: Secondary | ICD-10-CM | POA: Diagnosis not present

## 2013-12-24 DIAGNOSIS — F411 Generalized anxiety disorder: Secondary | ICD-10-CM

## 2013-12-24 DIAGNOSIS — M5117 Intervertebral disc disorders with radiculopathy, lumbosacral region: Secondary | ICD-10-CM | POA: Diagnosis present

## 2013-12-24 DIAGNOSIS — Z23 Encounter for immunization: Secondary | ICD-10-CM

## 2013-12-24 DIAGNOSIS — M5432 Sciatica, left side: Secondary | ICD-10-CM

## 2013-12-24 LAB — LIPID PANEL
CHOLESTEROL: 198 mg/dL (ref 0–200)
HDL: 38 mg/dL — ABNORMAL LOW (ref >39)
LDL Cholesterol: 129 mg/dL — ABNORMAL HIGH (ref 0–99)
Total CHOL/HDL Ratio: 5.2 Ratio
Triglycerides: 154 mg/dL — ABNORMAL HIGH (ref <150)
VLDL: 31 mg/dL (ref 0–40)

## 2013-12-24 MED ORDER — BUPROPION HCL ER (XL) 150 MG PO TB24: ORAL_TABLET | ORAL | Status: DC

## 2013-12-24 MED ORDER — BUSPIRONE HCL 10 MG PO TABS: 10.0000 mg | ORAL_TABLET | Freq: Three times a day (TID) | ORAL | Status: DC | PRN

## 2013-12-24 NOTE — Assessment & Plan Note (Signed)
Motivated to quit. Has not set a quit date. Complicated with several family members that smoke. Has tried several alternatives previously.  - Wellbutrin 150 mg for three days; then 150 mg BID.  - can be used up to 7-12 weeks. If no success at 7th week then quitting is unlikely.

## 2013-12-24 NOTE — Assessment & Plan Note (Addendum)
No MRI has been done. She reports this is an ongoing issue for several year, >10 years. Has done PT. Doing exercises at home learned from PT with minimal improvement. Sciatica is intermittent and negative straight leg raise today. SI dysfunction could be contributing. She's not interested in using a lot of medication.  - TENS unit; can be purchased only. Given information.  - Given home stretches and exercises  - lumbar films today showing no fractures, only arthritic changes.  - can continue taking naproxen.  - consider nortriptyline or cymbalta going forward

## 2013-12-24 NOTE — Patient Instructions (Signed)
Thank you for coming in,   We will call you with the x-ray results and the results of your lab test.   Please let me know how you tolerate the wellbutrin. Please take as directed.   Please follow up with me as needed.    Please feel free to call with any questions or concerns at any time, at [phone]. --Dr. Raeford Razor

## 2013-12-24 NOTE — Progress Notes (Signed)
   Subjective:    Patient ID: Sharon Cain, female    DOB: June 15, 1955, 58 y.o.   MRN: [id number]  HPI  Sharon Cain is here for physical and f/u for back pain and tobacco abuse.  1. Sciatica: This has been going on 10 years. Pain has been intermittent. She has been doing exercises she learned from physical therapy but these haven't been helping. Naproxen for pain BID but not helping anymore. Sitting or laying down causes the pain. Pain mostly felt in left buttock. No decreased sensation or weakness. No bowel or bladder dysfunction. Last performed PT 4 years ago. She is not taking gabapentin.   2. Plantar wart on forefoot. She was wanting different ways to remove it. It is only painful aftwer walking after a long period.   3. She is down to 1/2 PPD with her smoking. She has tried the patches and vapes and her attitude changes. She has tried gum which didn't work. Doesn't have a quit date. She is wanting to quit after seeing her mother. 8/10 on importance for her to quit. Tends to smoke more at home than away. Only smokes two cigarettes at work.     Current Outpatient Prescriptions on File Prior to Visit  Medication Sig Dispense Refill  . aspirin 81 MG EC tablet Take 81 mg by mouth daily.        . busPIRone (BUSPAR) 10 MG tablet Take 1 tablet (10 mg total) by mouth 3 (three) times daily as needed.  30 tablet  2  . Glucosamine 500 MG CAPS Take 2 capsules by mouth daily.        Marland Kitchen ibuprofen (ADVIL,MOTRIN) 600 MG tablet TAKE ONE TABLET BY MOUTH EVERY 6 HOURS WITH FOOD AS NEEDED FOR PAIN  60 tablet  3  . Multiple Vitamin (MULTIVITAMIN PO) Take 1 tablet by mouth daily.        . Omega-3 Fatty Acids (FISH OIL) 1000 MG CAPS Take 2 capsules by mouth 2 (two) times daily.       . ranitidine (ZANTAC) 150 MG tablet Take 1 tablet (150 mg total) by mouth at bedtime.  30 tablet  0   No current facility-administered medications on file prior to visit.   SHx: is trying to quit smoking.   Health  Maintenance: Flu vaccine completed today   Review of Systems See HPI    Objective:   Physical Exam BP 150/90  Pulse 60  Ht 5\' 1"  (1.549 m)  Wt 153 lb (69.4 kg)  BMI 28.92 kg/m2 Gen: NAD, alert, cooperative with exam, well-appearing HEENT: poor dentition CV: RRR, good S1/S2, no murmur, no edema, capillary refill brisk  Resp: CTABL, no wheezes, non-labored Abd: SNTND, BS present,  MSK: no spinal tenderness, negative straight leg sign b/l, +2 DTR's in LE, 5/5 strength in LE b/l       Assessment & Plan:

## 2014-04-27 ENCOUNTER — Telehealth: Payer: Self-pay | Admitting: Family Medicine

## 2014-04-27 ENCOUNTER — Ambulatory Visit (INDEPENDENT_AMBULATORY_CARE_PROVIDER_SITE_OTHER): Payer: BLUE CROSS/BLUE SHIELD | Admitting: Family Medicine

## 2014-04-27 ENCOUNTER — Ambulatory Visit (HOSPITAL_COMMUNITY)
Admission: RE | Admit: 2014-04-27 | Discharge: 2014-04-27 | Disposition: A | Discharge status: Home / Self Care | Payer: BLUE CROSS/BLUE SHIELD | Source: Ambulatory Visit | Attending: Family Medicine | Admitting: Family Medicine

## 2014-04-27 ENCOUNTER — Encounter: Payer: Self-pay | Admitting: Family Medicine

## 2014-04-27 VITALS — BP 130/70 | HR 40 | Temp 98.7°F | Ht 61.0 in | Wt 151.7 lb

## 2014-04-27 DIAGNOSIS — R079 Chest pain, unspecified: Secondary | ICD-10-CM | POA: Insufficient documentation

## 2014-04-27 DIAGNOSIS — R0789 Other chest pain: Secondary | ICD-10-CM

## 2014-04-27 DIAGNOSIS — I493 Ventricular premature depolarization: Secondary | ICD-10-CM | POA: Insufficient documentation

## 2014-04-27 NOTE — Patient Instructions (Signed)
Thank you for coming in,   Willow job on cutting down on the smoking. I know doing that in a household of other smoker's can be extremely difficult.   We will check a lab today to make sure that nothing is going on with your heart.    Please feel free to call with any questions or concerns at any time, at [phone]. --Dr. Raeford Razor

## 2014-04-27 NOTE — Telephone Encounter (Signed)
Received page from the lab that troponin result was <0.03. Called patient to inform that the result was negative. Advised that if she is to have chest pain, dyspnea, radiation of pain, diaphoresis she needs to be evaluated in the ED tonight. She voiced understanding.

## 2014-04-27 NOTE — Progress Notes (Signed)
   Subjective:    Patient ID: [name], female    DOB: [birth year], 59 y.o.   MRN: [id number]  HPI  Sharon Cain is here for chest heaviness.   She has experienced chest heaviness since she has stopped smoking. She stopped smoking about 4 weeks ago. It is not related to activity. She denies any pain, numbness or tingling in her left arm. She does have some dizzy spells and hot flashes. She has a history of anxiety but takes her medication on a more PRN basis. She denies any reflux and doesn't take NSAIDS. No family member with an MI before the age of 29. She takes an ASA once a day. .   Current Outpatient Prescriptions on File Prior to Visit  Medication Sig Dispense Refill  . aspirin 81 MG EC tablet Take 81 mg by mouth daily.      Marland Kitchen buPROPion (WELLBUTRIN XL) 150 MG 24 hr tablet Take 150 mg once daily for three days. Then take 150 mg two times daily per day going forward. 30 tablet 0  . busPIRone (BUSPAR) 10 MG tablet Take 1 tablet (10 mg total) by mouth 3 (three) times daily as needed. 30 tablet 2  . Glucosamine 500 MG CAPS Take 2 capsules by mouth daily.      Marland Kitchen ibuprofen (ADVIL,MOTRIN) 600 MG tablet TAKE ONE TABLET BY MOUTH EVERY 6 HOURS WITH FOOD AS NEEDED FOR PAIN 60 tablet 3  . Multiple Vitamin (MULTIVITAMIN PO) Take 1 tablet by mouth daily.      . Omega-3 Fatty Acids (FISH OIL) 1000 MG CAPS Take 2 capsules by mouth 2 (two) times daily.     . ranitidine (ZANTAC) 150 MG tablet Take 1 tablet (150 mg total) by mouth at bedtime. 30 tablet 0   No current facility-administered medications on file prior to visit.     Review of Systems See HPI     Objective:   Physical Exam BP 130/70 mmHg  Pulse 40  Temp(Src) 98.7 F (37.1 C) (Oral)  Ht 5\' 1"  (1.549 m)  Wt 151 lb 11.2 oz (68.811 kg)  BMI 28.68 kg/m2 Gen: NAD, alert, cooperative with exam, well-appearing CV: RRR, good S1/S2, no murmur, no edema, capillary refill brisk  Resp: CTABL, no wheezes, non-labored Abd: SNTND, BS  present, no guarding or organomegaly Skin: no rashes, normal turgor          Assessment & Plan:

## 2014-04-28 LAB — TROPONIN I: Troponin I: <0.03 ng/mL (ref <0.30)

## 2014-04-30 DIAGNOSIS — R0789 Other chest pain: Secondary | ICD-10-CM | POA: Insufficient documentation

## 2014-04-30 NOTE — Assessment & Plan Note (Signed)
Most likely 2/2 to anxiety. HEART score of 1. No history of reflux. She just quit smoking and has several stressors in her life. Most of her family members are still smoking so it makes it difficult to be around them.  - EKG: comparable to previous  - Troponin -  - given warning signs

## 2014-07-25 ENCOUNTER — Ambulatory Visit (INDEPENDENT_AMBULATORY_CARE_PROVIDER_SITE_OTHER): Payer: BLUE CROSS/BLUE SHIELD | Admitting: Family Medicine

## 2014-07-25 ENCOUNTER — Encounter: Payer: Self-pay | Admitting: Family Medicine

## 2014-07-25 VITALS — BP 126/65 | HR 67 | Ht 61.0 in | Wt 155.0 lb

## 2014-07-25 DIAGNOSIS — Z720 Tobacco use: Secondary | ICD-10-CM

## 2014-07-25 DIAGNOSIS — IMO0001 Reserved for inherently not codable concepts without codable children: Secondary | ICD-10-CM | POA: Insufficient documentation

## 2014-07-25 DIAGNOSIS — R030 Elevated blood-pressure reading, without diagnosis of hypertension: Secondary | ICD-10-CM

## 2014-07-25 NOTE — Patient Instructions (Signed)
Your BP is normal today. No concerns at all. Continue a healthy diet and low in sodium. The best thing you can is STOP SMOKING. If you decide you want assistance please do not hesitate to call, we have a program to help you with that.  Smoking Cessation Quitting smoking is important to your health and has many advantages. However, it is not always easy to quit since nicotine is a very addictive drug. Oftentimes, people try 3 times or more before being able to quit. This document explains the best ways for you to prepare to quit smoking. Quitting takes hard work and a lot of effort, but you can do it. ADVANTAGES OF QUITTING SMOKING  You will live longer, feel better, and live better.  Your body will feel the impact of quitting smoking almost immediately.  Within 20 minutes, blood pressure decreases. Your pulse returns to its normal level.  After 8 hours, carbon monoxide levels in the blood return to normal. Your oxygen level increases.  After 24 hours, the chance of having a heart attack starts to decrease. Your breath, hair, and body stop smelling like smoke.  After 48 hours, damaged nerve endings begin to recover. Your sense of taste and smell improve.  After 72 hours, the body is virtually free of nicotine. Your bronchial tubes relax and breathing becomes easier.  After 2 to 12 weeks, lungs can hold more air. Exercise becomes easier and circulation improves.  The risk of having a heart attack, stroke, cancer, or lung disease is greatly reduced.  After 1 year, the risk of coronary heart disease is cut in half.  After 5 years, the risk of stroke falls to the same as a nonsmoker.  After 10 years, the risk of lung cancer is cut in half and the risk of other cancers decreases significantly.  After 15 years, the risk of coronary heart disease drops, usually to the level of a nonsmoker.  If you are pregnant, quitting smoking will improve your chances of having a healthy baby.  The people  you live with, especially any children, will be healthier.  You will have extra money to spend on things other than cigarettes. QUESTIONS TO THINK ABOUT BEFORE ATTEMPTING TO QUIT You may want to talk about your answers with your health care provider.  Why do you want to quit?  If you tried to quit in the past, what helped and what did not?  What will be the most difficult situations for you after you quit? How will you plan to handle them?  Who can help you through the tough times? Your family? Friends? A health care provider?  What pleasures do you get from smoking? What ways can you still get pleasure if you quit? Here are some questions to ask your health care provider:  How can you help me to be successful at quitting?  What medicine do you think would be best for me and how should I take it?  What should I do if I need more help?  What is smoking withdrawal like? How can I get information on withdrawal? GET READY  Set a quit date.  Change your environment by getting rid of all cigarettes, ashtrays, matches, and lighters in your home, car, or work. Do not let people smoke in your home.  Review your past attempts to quit. Think about what worked and what did not. GET SUPPORT AND ENCOURAGEMENT You have a better chance of being successful if you have help. You can get  support in many ways.  Tell your family, friends, and coworkers that you are going to quit and need their support. Ask them not to smoke around you.  Get individual, group, or telephone counseling and support. Programs are available at [employer] and health centers. Call your local health department for information about programs in your area.  Spiritual beliefs and practices may help some smokers quit.  Download a "quit meter" on your computer to keep track of quit statistics, such as how long you have gone without smoking, cigarettes not smoked, and money saved.  Get a self-help book about quitting  smoking and staying off tobacco. [place] yourself from urges to smoke. Talk to someone, go for a walk, or occupy your time with a task.  Change your normal routine. Take a different route to work. Drink tea instead of coffee. Eat breakfast in a different place.  Reduce your stress. Take a hot bath, exercise, or read a book.  Plan something enjoyable to do every day. Reward yourself for not smoking.  Explore interactive web-based programs that specialize in helping you quit. GET MEDICINE AND USE IT CORRECTLY Medicines can help you stop smoking and decrease the urge to smoke. Combining medicine with the above behavioral methods and support can greatly increase your chances of successfully quitting smoking.  Nicotine replacement therapy helps deliver nicotine to your body without the negative effects and risks of smoking. Nicotine replacement therapy includes nicotine gum, lozenges, inhalers, nasal sprays, and skin patches. Some may be available over-the-counter and others require a prescription.  Antidepressant medicine helps people abstain from smoking, but how this works is unknown. This medicine is available by prescription.  Nicotinic receptor partial agonist medicine simulates the effect of nicotine in your brain. This medicine is available by prescription. Ask your health care provider for advice about which medicines to use and how to use them based on your health history. Your health care provider will tell you what side effects to look out for if you choose to be on a medicine or therapy. Carefully read the information on the package. Do not use any other product containing nicotine while using a nicotine replacement product.  RELAPSE OR DIFFICULT SITUATIONS Most relapses occur within the first 3 months after quitting. Do not be discouraged if you start smoking again. Remember, most people try several times before finally quitting. You may have symptoms  of withdrawal because your body is used to nicotine. You may crave cigarettes, be irritable, feel very hungry, cough often, get headaches, or have difficulty concentrating. The withdrawal symptoms are only temporary. They are strongest when you first quit, but they will go away within 10-14 days. To reduce the chances of relapse, try to:  Avoid drinking alcohol. Drinking lowers your chances of successfully quitting.  Reduce the amount of caffeine you consume. Once you quit smoking, the amount of caffeine in your body increases and can give you symptoms, such as a rapid heartbeat, sweating, and anxiety.  Avoid smokers because they can make you want to smoke.  Do not let weight gain distract you. Many smokers will gain weight when they quit, usually less than 10 pounds. Eat a healthy diet and stay active. You can always lose the weight gained after you quit.  Find ways to improve your mood other than smoking. FOR MORE INFORMATION  www.smokefree.gov  Document Released: 03/05/2001 Document Revised: 07/26/2013 Document Reviewed: 06/20/2011 [facility] Patient Information 2015 [place], [place]. This information is not  intended to replace advice given to you by your health care provider. Make sure you discuss any questions you have with your health care provider.  Smoking Cessation, Tips for Success If you are ready to quit smoking, congratulations! You have chosen to help yourself be healthier. Cigarettes bring nicotine, tar, carbon monoxide, and other irritants into your body. Your lungs, heart, and blood vessels will be able to work better without these poisons. There are many different ways to quit smoking. Nicotine gum, nicotine patches, a nicotine inhaler, or nicotine nasal spray can help with physical craving. Hypnosis, support groups, and medicines help break the habit of smoking. WHAT THINGS CAN I DO TO MAKE QUITTING EASIER?  Here are some tips to help you quit for good:  Pick a date when you  will quit smoking completely. Tell all of your friends and family about your plan to quit on that date.  Do not try to slowly cut down on the number of cigarettes you are smoking. Pick a quit date and quit smoking completely starting on that day.  Throw away all cigarettes.   Clean and remove all ashtrays from your home, work, and car.  On a card, write down your reasons for quitting. Carry the card with you and read it when you get the urge to smoke.  Cleanse your body of nicotine. Drink enough water and fluids to keep your urine clear or pale yellow. Do this after quitting to flush the nicotine from your body.  Learn to predict your moods. Do not let a bad situation be your excuse to have a cigarette. Some situations in your life might tempt you into wanting a cigarette.  Never have "just one" cigarette. It leads to wanting another and another. Remind yourself of your decision to quit.  Change habits associated with smoking. If you smoked while driving or when feeling stressed, try other activities to replace smoking. Stand up when drinking your coffee. Brush your teeth after eating. Sit in a different chair when you read the paper. Avoid alcohol while trying to quit, and try to drink fewer caffeinated beverages. Alcohol and caffeine may urge you to smoke.  Avoid foods and drinks that can trigger a desire to smoke, such as sugary or spicy foods and alcohol.  Ask people who smoke not to smoke around you.  Have something planned to do right after eating or having a cup of coffee. For example, plan to take a walk or exercise.  Try a relaxation exercise to calm you down and decrease your stress. Remember, you may be tense and nervous for the first 2 weeks after you quit, but this will pass.  Find new activities to keep your hands busy. Play with a pen, coin, or rubber band. Doodle or draw things on paper.  Brush your teeth right after eating. This will help cut down on the craving for the  taste of tobacco after meals. You can also try mouthwash.   Use oral substitutes in place of cigarettes. Try using lemon drops, carrots, cinnamon sticks, or chewing gum. Keep them handy so they are available when you have the urge to smoke.  When you have the urge to smoke, try deep breathing.  Designate your home as a nonsmoking area.  If you are a heavy smoker, ask your health care provider about a prescription for nicotine chewing gum. It can ease your withdrawal from nicotine.  Reward yourself. Set aside the cigarette money you save and buy yourself something nice.  Look for support from others. Join a support group or smoking cessation program. Ask someone at home or at work to help you with your plan to quit smoking.  Always ask yourself, "Do I need this cigarette or is this just a reflex?" Tell yourself, "Today, I choose not to smoke," or "I do not want to smoke." You are reminding yourself of your decision to quit.  Do not replace cigarette smoking with electronic cigarettes (commonly called e-cigarettes). The safety of e-cigarettes is unknown, and some may contain harmful chemicals.  If you relapse, do not give up! Plan ahead and think about what you will do the next time you get the urge to smoke. HOW WILL I FEEL WHEN I QUIT SMOKING? You may have symptoms of withdrawal because your body is used to nicotine (the addictive substance in cigarettes). You may crave cigarettes, be irritable, feel very hungry, cough often, get headaches, or have difficulty concentrating. The withdrawal symptoms are only temporary. They are strongest when you first quit but will go away within 10-14 days. When withdrawal symptoms occur, stay in control. Think about your reasons for quitting. Remind yourself that these are signs that your body is healing and getting used to being without cigarettes. Remember that withdrawal symptoms are easier to treat than the major diseases that smoking can cause.  Even  after the withdrawal is over, expect periodic urges to smoke. However, these cravings are generally short lived and will go away whether you smoke or not. Do not smoke! WHAT RESOURCES ARE AVAILABLE TO HELP ME QUIT SMOKING? Your health care provider can direct you to community resources or hospitals for support, which may include:  Group support.  Education.  Hypnosis.  Therapy. Document Released: 12/08/2003 Document Revised: 07/26/2013 Document Reviewed: 08/27/2012 [facility] Patient Information 2015 [place], [place]. This information is not intended to replace advice given to you by your health care provider. Make sure you discuss any questions you have with your health care provider.

## 2014-07-25 NOTE — Progress Notes (Signed)
   Subjective:    Patient ID: Sharon Cain, female    DOB: [birth year], 59 y.o.   MRN: [id number]  HPI  Elevated blood pressure reading: Patient presents to family medicine clinic, same day appointment for an elevated blood pressure reading of 181/171 by a home automatic blood pressure system. Patient states she was completely asymptomatic, no chest pain, no shortness of breath, no visual changes, no headache or lower extremity swelling. She doesn't some increased stress recently. She is on BuSpar medication for anxiety. Patient does smoke about 1 pack per day of tobacco. Patient had a history of hypertension approximately 10 years ago, was on HCTZ but that had been stopped many years ago without difficulty.  Current every day smoker Past Medical History  Diagnosis Date  . Hypertension   . Hyperlipidemia   . Depression   . Arthritis   . Anxiety   . Insomnia   . Obesity   . Chest pain, central 11/15/2010    Patient admitted to hospital for cardiac rule-out. No MI.    Marland Kitchen DEPRESSION 04/02/2007    Qualifier: Diagnosis of  By: Henderson Baltimore MD, Janett Billow     No Known Allergies   Review of Systems Per history of present illness    Objective:   Physical Exam BP 126/65 mmHg  Pulse 67  Ht 5\' 1"  (1.549 m)  Wt 155 lb (70.308 kg)  BMI 29.30 kg/m2 Gen: NAD. Nontoxic in appearance. Well-developed, well-nourished, Caucasian female. Mildly overweight. Smells of smoke. HEENT: AT. [place]. Bilateral eyes without injections or icterus. MMM. Poor dentition . CV: RRR, no murmurs appreciated Chest: CTAB, no wheeze or crackles Ext: No erythema. No edema.  Psych: Mildly anxious, normal dress, normal demeanor. Normal speech    Assessment & Plan:

## 2014-07-25 NOTE — Assessment & Plan Note (Signed)
Smoking cessation declined by patient today.

## 2014-07-25 NOTE — Assessment & Plan Note (Addendum)
Elevated BP on a blood pressure cuff. Likely erroneous reading, as patient's blood pressure here is normal and her reading will find a EMT friend was also normal yesterday. Encouraged her to continue watching her diet, eating healthy, possible fruits and vegetables and low salt. Encourage smoking cessation, patient is not feeling like this is something she wants to pursue at this time. Follow-up as needed

## 2014-12-23 ENCOUNTER — Ambulatory Visit: Payer: BLUE CROSS/BLUE SHIELD

## 2015-01-02 ENCOUNTER — Other Ambulatory Visit: Payer: Self-pay | Admitting: Family Medicine

## 2015-03-26 DIAGNOSIS — I4891 Unspecified atrial fibrillation: Secondary | ICD-10-CM

## 2015-03-26 HISTORY — DX: Unspecified atrial fibrillation: I48.91

## 2015-03-30 ENCOUNTER — Encounter: Payer: Self-pay | Admitting: Internal Medicine

## 2015-05-16 ENCOUNTER — Other Ambulatory Visit: Payer: Self-pay

## 2015-05-16 DIAGNOSIS — Z1231 Encounter for screening mammogram for malignant neoplasm of breast: Secondary | ICD-10-CM

## 2015-05-24 ENCOUNTER — Encounter: Payer: BLUE CROSS/BLUE SHIELD | Admitting: Internal Medicine

## 2015-05-31 ENCOUNTER — Ambulatory Visit
Admission: RE | Admit: 2015-05-31 | Discharge: 2015-05-31 | Disposition: A | Discharge status: Home / Self Care | Payer: BLUE CROSS/BLUE SHIELD | Source: Ambulatory Visit

## 2015-05-31 ENCOUNTER — Encounter: Payer: Self-pay | Admitting: Internal Medicine

## 2015-05-31 DIAGNOSIS — Z1231 Encounter for screening mammogram for malignant neoplasm of breast: Secondary | ICD-10-CM

## 2015-12-09 ENCOUNTER — Encounter (HOSPITAL_COMMUNITY): Payer: Self-pay | Admitting: *Deleted

## 2015-12-09 ENCOUNTER — Emergency Department (HOSPITAL_COMMUNITY): Payer: Self-pay

## 2015-12-09 ENCOUNTER — Inpatient Hospital Stay (HOSPITAL_COMMUNITY)
Admission: EM | Admit: 2015-12-09 | Discharge: 2015-12-11 | DRG: 309 | Disposition: A | Discharge status: Home / Self Care | Payer: Self-pay | Attending: Internal Medicine | Admitting: Internal Medicine

## 2015-12-09 DIAGNOSIS — Z8241 Family history of sudden cardiac death: Secondary | ICD-10-CM

## 2015-12-09 DIAGNOSIS — Z720 Tobacco use: Secondary | ICD-10-CM | POA: Diagnosis present

## 2015-12-09 DIAGNOSIS — Z6832 Body mass index (BMI) 32.0-32.9, adult: Secondary | ICD-10-CM

## 2015-12-09 DIAGNOSIS — R001 Bradycardia, unspecified: Secondary | ICD-10-CM | POA: Diagnosis present

## 2015-12-09 DIAGNOSIS — I5022 Chronic systolic (congestive) heart failure: Secondary | ICD-10-CM | POA: Diagnosis present

## 2015-12-09 DIAGNOSIS — Z8249 Family history of ischemic heart disease and other diseases of the circulatory system: Secondary | ICD-10-CM

## 2015-12-09 DIAGNOSIS — I110 Hypertensive heart disease with heart failure: Secondary | ICD-10-CM | POA: Diagnosis present

## 2015-12-09 DIAGNOSIS — R0789 Other chest pain: Secondary | ICD-10-CM | POA: Diagnosis present

## 2015-12-09 DIAGNOSIS — I200 Unstable angina: Secondary | ICD-10-CM

## 2015-12-09 DIAGNOSIS — R079 Chest pain, unspecified: Secondary | ICD-10-CM

## 2015-12-09 DIAGNOSIS — Z825 Family history of asthma and other chronic lower respiratory diseases: Secondary | ICD-10-CM

## 2015-12-09 DIAGNOSIS — E669 Obesity, unspecified: Secondary | ICD-10-CM | POA: Diagnosis present

## 2015-12-09 DIAGNOSIS — F329 Major depressive disorder, single episode, unspecified: Secondary | ICD-10-CM | POA: Diagnosis present

## 2015-12-09 DIAGNOSIS — E876 Hypokalemia: Secondary | ICD-10-CM | POA: Diagnosis present

## 2015-12-09 DIAGNOSIS — I4891 Unspecified atrial fibrillation: Secondary | ICD-10-CM

## 2015-12-09 DIAGNOSIS — Z833 Family history of diabetes mellitus: Secondary | ICD-10-CM

## 2015-12-09 DIAGNOSIS — Z7982 Long term (current) use of aspirin: Secondary | ICD-10-CM

## 2015-12-09 DIAGNOSIS — E785 Hyperlipidemia, unspecified: Secondary | ICD-10-CM | POA: Diagnosis present

## 2015-12-09 DIAGNOSIS — I480 Paroxysmal atrial fibrillation: Principal | ICD-10-CM | POA: Diagnosis present

## 2015-12-09 DIAGNOSIS — F17210 Nicotine dependence, cigarettes, uncomplicated: Secondary | ICD-10-CM | POA: Diagnosis present

## 2015-12-09 DIAGNOSIS — Z79899 Other long term (current) drug therapy: Secondary | ICD-10-CM

## 2015-12-09 DIAGNOSIS — F411 Generalized anxiety disorder: Secondary | ICD-10-CM | POA: Diagnosis present

## 2015-12-09 DIAGNOSIS — Z9049 Acquired absence of other specified parts of digestive tract: Secondary | ICD-10-CM

## 2015-12-09 LAB — CBC
HEMATOCRIT: 46.7 % — AB (ref 36.0–46.0)
HEMOGLOBIN: 15.8 g/dL — AB (ref 12.0–15.0)
MCH: 31.5 pg (ref 26.0–34.0)
MCHC: 33.8 g/dL (ref 30.0–36.0)
MCV: 93 fL (ref 78.0–100.0)
Platelets: 173 10*3/uL (ref 150–400)
RBC: 5.02 MIL/uL (ref 3.87–5.11)
RDW: 13.6 % (ref 11.5–15.5)
WBC: 8.8 10*3/uL (ref 4.0–10.5)

## 2015-12-09 LAB — URINE MICROSCOPIC-ADD ON
Bacteria, UA: NONE SEEN
WBC UA: NONE SEEN WBC/hpf (ref 0–5)

## 2015-12-09 LAB — BASIC METABOLIC PANEL
ANION GAP: 11 (ref 5–15)
BUN: 12 mg/dL (ref 6–20)
CO2: 23 mmol/L (ref 22–32)
Calcium: 10.2 mg/dL (ref 8.9–10.3)
Chloride: 108 mmol/L (ref 101–111)
Creatinine, Ser: 0.82 mg/dL (ref 0.44–1.00)
GFR calc Af Amer: >60 mL/min (ref >60)
Glucose, Bld: 145 mg/dL — ABNORMAL HIGH (ref 65–99)
POTASSIUM: 3.2 mmol/L — AB (ref 3.5–5.1)
SODIUM: 142 mmol/L (ref 135–145)

## 2015-12-09 LAB — URINALYSIS, ROUTINE W REFLEX MICROSCOPIC
BILIRUBIN URINE: NEGATIVE
Glucose, UA: NEGATIVE mg/dL
Ketones, ur: NEGATIVE mg/dL
Leukocytes, UA: NEGATIVE
NITRITE: NEGATIVE
PROTEIN: NEGATIVE mg/dL
SPECIFIC GRAVITY, URINE: 1.002 — AB (ref 1.005–1.030)
pH: 6.5 (ref 5.0–8.0)

## 2015-12-09 LAB — TROPONIN I: Troponin I: <0.03 ng/mL (ref <0.03)

## 2015-12-09 MED ORDER — POTASSIUM CHLORIDE CRYS ER 20 MEQ PO TBCR
40.0000 meq | EXTENDED_RELEASE_TABLET | Freq: Once | ORAL | Status: AC
  Administered 2015-12-10: 40 meq via ORAL
  Filled 2015-12-09: qty 2

## 2015-12-09 NOTE — ED Triage Notes (Signed)
The pt is c/o central chest pain intermittently for 3-4 weeks  With sob   Pain gets better when she sits or lies still   No previous history

## 2015-12-09 NOTE — ED Notes (Signed)
Family at bedside. 

## 2015-12-09 NOTE — ED Provider Notes (Signed)
[facility] DEPT Provider Note   CSN: [id number] Arrival date & time: 12/09/15  1936     History   Chief Complaint Chief Complaint  Patient presents with  . Chest Pain    HPI Sharon Cain is a 60 y.o. female with a hx of anxiety, depression, HTN, hyperlipidemia,  presents to the Emergency Department complaining of gradual, persistent, progressively worsening chest pain onset 6:30pm. Associated symptoms include left jaw pain, chest heaviness, left arm heaviness.  Pt reports 10/10 chest pain when she is moving around, but rates her pain at a 2/10.  Pt reports intermittent episodes similar to this for the last several months, but tonight it began and did not dissipate.  She reports episodes lasted only 5-14min with associated SOB and chest pain.  Pt reports associated SOB today along with palpitations and nausea.  Pt reports  admission for CP w/u in 2012 without MI.  No hx of a-fib.  Rest makes it better and exertion makes it worse.  Pt denies fever, chills, headache, abd pain, vomiting, diarrhea, weakness, dizziness, syncope.  Pt reports she is a current smoker   The history is provided by the patient and medical records. No language interpreter was used.    Past Medical History:  Diagnosis Date  . Anxiety   . Arthritis   . Chest pain, central 11/15/2010   Patient admitted to hospital for cardiac rule-out. No MI.    . Depression   . DEPRESSION 04/02/2007   Qualifier: Diagnosis of  By: Henderson Baltimore MD, Janett Billow    . Hyperlipidemia   . Hypertension   . Insomnia   . Obesity     Patient Active Problem List   Diagnosis Date Noted  . New onset atrial fibrillation ([place]) 12/10/2015  . Hypokalemia 12/10/2015  . Back pain with left-sided sciatica 12/24/2012  . OBESITY 07/26/2009  . INSOMNIA-SLEEP DISORDER-UNSPEC 05/18/2007  . OSTEOARTHRITIS 04/02/2007  . HYPERLIPIDEMIA 03/30/2007  . Anxiety state 03/30/2007  . Tobacco abuse 03/30/2007    Past Surgical History:  Procedure  Laterality Date  . CARPAL TUNNEL RELEASE     bilateral  . CHOLECYSTECTOMY      OB History    No data available       Home Medications    Prior to Admission medications   Medication Sig Start Date End Date Taking? Authorizing Provider  aspirin 81 MG EC tablet Take 81 mg by mouth daily.     Yes Historical Provider, MD  busPIRone (BUSPAR) 10 MG tablet TAKE ONE TABLET BY MOUTH THREE TIMES DAILY AS NEEDED Patient taking differently: Take 10 mg by mouth three times a day as needed for anxiety 01/02/15  Yes Rosemarie Ax, MD  CALCIUM PO Take 1 tablet by mouth daily.   Yes Historical Provider, MD  Cholecalciferol (VITAMIN D-3 PO) Take 1 capsule by mouth every morning.   Yes Historical Provider, MD  Omega-3 Fatty Acids (FISH OIL) 1000 MG CAPS Take 2 capsules by mouth every morning.    Yes Historical Provider, MD  ibuprofen (ADVIL,MOTRIN) 600 MG tablet TAKE ONE TABLET BY MOUTH EVERY 6 HOURS WITH FOOD AS NEEDED FOR PAIN Patient not taking: Reported on 12/09/2015 07/31/10   Alycia Rossetti, MD    Family History Family History  Problem Relation Age of Onset  . Atrial fibrillation Mother   . Diabetes Mother   . Heart failure Mother   . COPD Mother   . COPD Father   . Congestive Heart Failure Father   .  Sudden Cardiac Death Father     Social History Social History  Substance Use Topics  . Smoking status: Current Every Day Smoker    Packs/day: 1.00    Types: Cigarettes  . Smokeless tobacco: Never Used  . Alcohol use No     Allergies   Review of patient's allergies indicates no known allergies.   Review of Systems Review of Systems  Respiratory: Positive for shortness of breath.   Cardiovascular: Positive for chest pain and palpitations.  Gastrointestinal: Positive for nausea.  All other systems reviewed and are negative.    Physical Exam Updated Vital Signs BP 111/84   Pulse 98   Temp 98.6 F (37 C)   Resp 19   Ht 5' (1.524 m)   Wt 74.8 kg   SpO2 95%   BMI  32.22 kg/m   Physical Exam  Constitutional: She appears well-developed and well-nourished. No distress.  Awake, alert, nontoxic appearance  HENT:  Head: Normocephalic and atraumatic.  Mouth/Throat: Oropharynx is clear and moist. No oropharyngeal exudate.  Eyes: Conjunctivae are normal. No scleral icterus.  Neck: Normal range of motion. Neck supple.  Cardiovascular: Intact distal pulses.  An irregularly irregular rhythm present. Tachycardia present.   Pulses:      Radial pulses are 2+ on the right side, and 2+ on the left side.       Dorsalis pedis pulses are 2+ on the right side, and 2+ on the left side.  Pulmonary/Chest: Effort normal and breath sounds normal. No respiratory distress. She has no wheezes.  Equal chest expansion  Abdominal: Soft. Bowel sounds are normal. She exhibits no mass. There is no tenderness. There is no rebound and no guarding.  Musculoskeletal: Normal range of motion. She exhibits no edema.  Neurological: She is alert.  Speech is clear and goal oriented Moves extremities without ataxia  Skin: Skin is warm and dry. She is not diaphoretic.  Psychiatric: She has a normal mood and affect.  Nursing note and vitals reviewed.    ED Treatments / Results  Labs (all labs ordered are listed, but only abnormal results are displayed) Labs Reviewed  BASIC METABOLIC PANEL - Abnormal; Notable for the following:       Result Value   Potassium 3.2 (*)    Glucose, Bld 145 (*)    All other components within normal limits  CBC - Abnormal; Notable for the following:    Hemoglobin 15.8 (*)    HCT 46.7 (*)    All other components within normal limits  URINALYSIS, ROUTINE W REFLEX MICROSCOPIC (NOT AT [facility]) - Abnormal; Notable for the following:    Specific Gravity, Urine 1.002 (*)    Hgb urine dipstick TRACE (*)    All other components within normal limits  URINE MICROSCOPIC-ADD ON - Abnormal; Notable for the following:    Squamous Epithelial / LPF 0-5 (*)    All other  components within normal limits  TROPONIN I  TROPONIN I  TROPONIN I  HEPARIN LEVEL (UNFRACTIONATED)  TROPONIN I  TSH  MAGNESIUM  BASIC METABOLIC PANEL  CBC  BRAIN NATRIURETIC PEPTIDE    EKG  EKG Interpretation  Date/Time:  Saturday December 09 2015 19:39:02 EDT Ventricular Rate:  123 PR Interval:    QRS Duration: 102 QT Interval:  320 QTC Calculation: 458 R Axis:   53 Text Interpretation:  Atrial fibrillation with rapid ventricular response with premature ventricular or aberrantly conducted complexes Possible Anterior infarct , age undetermined ST & T wave abnormality, consider  inferior ischemia Abnormal ECG Confirmed by Jeneen Rinks  MD, [place] ([id number]) on 12/09/2015 7:46:42 PM        EKG Interpretation  Date/Time:  Saturday December 09 2015 23:19:55 EDT Ventricular Rate:  96 PR Interval:    QRS Duration: 161 QT Interval:  336 QTC Calculation: 442 R Axis:   24 Text Interpretation:  Atrial fibrillation Nonspecific intraventricular conduction delay Borderline abnrm T, anterolateral leads Confirmed by Jeneen Rinks  MD, [place] ([id number]) on 12/10/2015 1:00:24 AM        Radiology Dg Chest 2 View  Result Date: 12/09/2015 CLINICAL DATA:  Chest pain and shortness of breath. EXAM: CHEST  2 VIEW COMPARISON:  06/04/2013 and prior radiograph FINDINGS: Cardiomegaly again noted. Mild interstitial opacities bilaterally may represent mild interstitial edema. No pleural effusion, airspace disease or pneumothorax identified. No acute bony abnormalities are present. IMPRESSION: Cardiomegaly with mild interstitial opacities -question mild interstitial edema. Electronically Signed   By: Margarette Canada M.D.   On: 12/09/2015 20:26    Procedures Procedures (including critical care time)   CRITICAL CARE Performed by: Abigail Butts Total critical care time: 45 minutes Critical care time was exclusive of separately billable procedures and treating other patients. Critical care was necessary to treat or  prevent imminent or life-threatening deterioration. Critical care was time spent personally by me on the following activities: development of treatment plan with patient and/or surrogate as well as nursing, discussions with consultants, evaluation of patient's response to treatment, examination of patient, obtaining history from patient or surrogate, ordering and performing treatments and interventions, ordering and review of laboratory studies, ordering and review of radiographic studies, pulse oximetry and re-evaluation of patient's condition.  Medications Ordered in ED Medications  heparin ADULT infusion 100 units/mL (25000 units/230mL sodium chloride 0.45%) (1,000 Units/hr Intravenous New Bag/Given 12/10/15 0202)  aspirin EC tablet 81 mg (not administered)  gi cocktail (Maalox,Lidocaine,Donnatal) (not administered)  acetaminophen (TYLENOL) tablet 650 mg (not administered)  ondansetron (ZOFRAN) injection 4 mg (not administered)  potassium chloride SA (K-DUR,KLOR-CON) CR tablet 20 mEq (not administered)  diphenhydrAMINE (BENADRYL) capsule 25-50 mg (25 mg Oral Given 12/10/15 0347)  amiodarone (NEXTERONE PREMIX) 360-4.14 MG/200ML-% (1.8 mg/mL) IV infusion (60 mg/hr Intravenous New Bag/Given 12/10/15 0246)  amiodarone (NEXTERONE PREMIX) 360-4.14 MG/200ML-% (1.8 mg/mL) IV infusion (not administered)  potassium chloride SA (K-DUR,KLOR-CON) CR tablet 40 mEq (40 mEq Oral Given 12/10/15 0154)  amiodarone (NEXTERONE) IV bolus only 150 mg/100 mL (150 mg Intravenous New Bag/Given 12/10/15 0220)  heparin bolus via infusion 3,000 Units (3,000 Units Intravenous Bolus from Bag 12/10/15 0157)     Initial Impression / Assessment and Plan / ED Course  I have reviewed the triage vital signs and the nursing notes.  Pertinent labs & imaging results that were available during my care of the patient were reviewed by me and considered in my medical decision making (see chart for details).  Clinical Course  Value  Comment By Time  Leukocytes, UA: NEGATIVE No evidence of UTI Abigail Butts, PA-C 09/16 2312  WBC: 8.8 No leukocytosis, slight hemoconcentration noted. Jarrett Soho Merna Baldi, PA-C 09/16 2312  Potassium: (!) 3.2 Hypokalemia. Jarrett Soho Jaelynn Pozo, PA-C 09/16 2312  BP: 111/84 No hypertension. Patient afebrile Abigail Butts, PA-C 09/16 2312   Record review shows stress test in September 2012 with normal pattern of perfusion in all regions, no evidence of inducible myocardial ischemia, no significant wall motion abnormalities, no EKG changes and exercise capacity of 7 METS.   Jarrett Soho Chrishana Spargur, PA-C 09/16 2354   CHAADSS-VASC score 2 Jarrett Soho  Selicia Windom, PA-C 09/17 0007   Discussed with Dr. Terrence Dupont who recommends anticoagulation with heparin and initiation of amiodarone bolus and drip. Abigail Butts, PA-C 09/17 0029   Pt admitted to stepdown with Amio drip Abigail Butts, PA-C 09/17 0121    Pt With unstable A. fib. New-onset of unknown duration. Patient does not qualify for cardioversion or discharge home. Will be admitted for workup, TEE and further management.  The patient was discussed with Dr. Jeneen Rinks who agrees with the treatment plan.   Final Clinical Impressions(s) / ED Diagnoses   Final diagnoses:  Unstable angina (HCC)  Atrial fibrillation, unspecified type ([place])  Chest pain, unspecified chest pain type    New Prescriptions New Prescriptions   No medications on file     Abigail Butts, PA-C 12/10/15 0710    Tanna Furry, MD 12/22/15 0045

## 2015-12-10 ENCOUNTER — Encounter (HOSPITAL_COMMUNITY): Payer: Self-pay | Admitting: Family Medicine

## 2015-12-10 DIAGNOSIS — I4891 Unspecified atrial fibrillation: Secondary | ICD-10-CM | POA: Diagnosis present

## 2015-12-10 DIAGNOSIS — F411 Generalized anxiety disorder: Secondary | ICD-10-CM

## 2015-12-10 DIAGNOSIS — Z720 Tobacco use: Secondary | ICD-10-CM

## 2015-12-10 DIAGNOSIS — E876 Hypokalemia: Secondary | ICD-10-CM | POA: Diagnosis present

## 2015-12-10 LAB — BASIC METABOLIC PANEL
Anion gap: 9 (ref 5–15)
BUN: 12 mg/dL (ref 6–20)
CO2: 25 mmol/L (ref 22–32)
Calcium: 9.2 mg/dL (ref 8.9–10.3)
Chloride: 108 mmol/L (ref 101–111)
Creatinine, Ser: 0.59 mg/dL (ref 0.44–1.00)
Glucose, Bld: 115 mg/dL — ABNORMAL HIGH (ref 65–99)
POTASSIUM: 3.7 mmol/L (ref 3.5–5.1)
SODIUM: 142 mmol/L (ref 135–145)

## 2015-12-10 LAB — LIPID PANEL
CHOL/HDL RATIO: 5.5 ratio
Cholesterol: 192 mg/dL (ref 0–200)
HDL: 35 mg/dL — AB (ref >40)
LDL Cholesterol: 131 mg/dL — ABNORMAL HIGH (ref 0–99)
TRIGLYCERIDES: 132 mg/dL (ref <150)
VLDL: 26 mg/dL (ref 0–40)

## 2015-12-10 LAB — HEPARIN LEVEL (UNFRACTIONATED)
HEPARIN UNFRACTIONATED: 0.48 [IU]/mL (ref 0.30–0.70)
Heparin Unfractionated: 0.47 IU/mL (ref 0.30–0.70)

## 2015-12-10 LAB — CBC
HEMATOCRIT: 42.1 % (ref 36.0–46.0)
Hemoglobin: 14 g/dL (ref 12.0–15.0)
MCH: 31.1 pg (ref 26.0–34.0)
MCHC: 33.3 g/dL (ref 30.0–36.0)
MCV: 93.6 fL (ref 78.0–100.0)
PLATELETS: 139 10*3/uL — AB (ref 150–400)
RBC: 4.5 MIL/uL (ref 3.87–5.11)
RDW: 13.7 % (ref 11.5–15.5)
WBC: 6.9 10*3/uL (ref 4.0–10.5)

## 2015-12-10 LAB — TROPONIN I

## 2015-12-10 LAB — MAGNESIUM: Magnesium: 2.1 mg/dL (ref 1.7–2.4)

## 2015-12-10 LAB — TSH: TSH: 2.163 u[IU]/mL (ref 0.350–4.500)

## 2015-12-10 MED ORDER — HEPARIN (PORCINE) IN NACL 100-0.45 UNIT/ML-% IJ SOLN
1000.0000 [IU]/h | INTRAMUSCULAR | Status: DC
  Administered 2015-12-10 (×2): 1000 [IU]/h via INTRAVENOUS
  Filled 2015-12-10 (×3): qty 250

## 2015-12-10 MED ORDER — POTASSIUM CHLORIDE CRYS ER 20 MEQ PO TBCR
40.0000 meq | EXTENDED_RELEASE_TABLET | Freq: Once | ORAL | Status: AC
  Administered 2015-12-10: 40 meq via ORAL
  Filled 2015-12-10: qty 2

## 2015-12-10 MED ORDER — DEXTROSE 5 % IV SOLN
30.0000 mg/h | INTRAVENOUS | Status: DC
  Filled 2015-12-10: qty 9

## 2015-12-10 MED ORDER — AMIODARONE HCL IN DEXTROSE 360-4.14 MG/200ML-% IV SOLN
60.0000 mg/h | INTRAVENOUS | Status: DC
  Administered 2015-12-10: 60 mg/h via INTRAVENOUS

## 2015-12-10 MED ORDER — AMIODARONE IV BOLUS ONLY 150 MG/100ML
150.0000 mg | Freq: Once | INTRAVENOUS | Status: AC
  Administered 2015-12-10: 150 mg via INTRAVENOUS
  Filled 2015-12-10: qty 100

## 2015-12-10 MED ORDER — ONDANSETRON HCL 4 MG/2ML IJ SOLN: 4.0000 mg | Freq: Four times a day (QID) | INTRAMUSCULAR | Status: DC | PRN

## 2015-12-10 MED ORDER — MAGNESIUM HYDROXIDE 400 MG/5ML PO SUSP
15.0000 mL | Freq: Every evening | ORAL | Status: DC | PRN
  Administered 2015-12-10: 15 mL via ORAL
  Filled 2015-12-10: qty 30

## 2015-12-10 MED ORDER — AMLODIPINE BESYLATE 5 MG PO TABS
5.0000 mg | ORAL_TABLET | Freq: Every day | ORAL | Status: DC
  Administered 2015-12-10 – 2015-12-11 (×2): 5 mg via ORAL
  Filled 2015-12-10 (×2): qty 1

## 2015-12-10 MED ORDER — INFLUENZA VAC SPLIT QUAD 0.5 ML IM SUSY
0.5000 mL | PREFILLED_SYRINGE | INTRAMUSCULAR | Status: AC
  Administered 2015-12-11: 0.5 mL via INTRAMUSCULAR

## 2015-12-10 MED ORDER — AMIODARONE HCL 200 MG PO TABS
200.0000 mg | ORAL_TABLET | Freq: Two times a day (BID) | ORAL | Status: DC
  Administered 2015-12-10 – 2015-12-11 (×3): 200 mg via ORAL
  Filled 2015-12-10 (×3): qty 1

## 2015-12-10 MED ORDER — ASPIRIN EC 81 MG PO TBEC
81.0000 mg | DELAYED_RELEASE_TABLET | Freq: Every day | ORAL | Status: DC
  Administered 2015-12-10 – 2015-12-11 (×2): 81 mg via ORAL
  Filled 2015-12-10 (×2): qty 1

## 2015-12-10 MED ORDER — POTASSIUM CHLORIDE CRYS ER 20 MEQ PO TBCR: 20.0000 meq | EXTENDED_RELEASE_TABLET | Freq: Two times a day (BID) | ORAL | Status: DC

## 2015-12-10 MED ORDER — DEXTROSE 5 % IV SOLN
60.0000 mg/h | INTRAVENOUS | Status: DC
  Filled 2015-12-10: qty 9

## 2015-12-10 MED ORDER — HEPARIN BOLUS VIA INFUSION
3000.0000 [IU] | Freq: Once | INTRAVENOUS | Status: AC
  Administered 2015-12-10: 3000 [IU] via INTRAVENOUS
  Filled 2015-12-10: qty 3000

## 2015-12-10 MED ORDER — AMIODARONE HCL IN DEXTROSE 360-4.14 MG/200ML-% IV SOLN
30.0000 mg/h | INTRAVENOUS | Status: DC
  Administered 2015-12-10: 30 mg/h via INTRAVENOUS
  Filled 2015-12-10: qty 200

## 2015-12-10 MED ORDER — GI COCKTAIL ~~LOC~~: 30.0000 mL | Freq: Four times a day (QID) | ORAL | Status: DC | PRN

## 2015-12-10 MED ORDER — NICOTINE 14 MG/24HR TD PT24
14.0000 mg | MEDICATED_PATCH | Freq: Every day | TRANSDERMAL | Status: DC
  Administered 2015-12-10 – 2015-12-11 (×2): 14 mg via TRANSDERMAL
  Filled 2015-12-10 (×2): qty 1

## 2015-12-10 MED ORDER — AMIODARONE HCL IN DEXTROSE 360-4.14 MG/200ML-% IV SOLN
INTRAVENOUS | Status: AC
  Filled 2015-12-10: qty 200

## 2015-12-10 MED ORDER — PNEUMOCOCCAL VAC POLYVALENT 25 MCG/0.5ML IJ INJ
0.5000 mL | INJECTION | INTRAMUSCULAR | Status: AC
  Administered 2015-12-11: 0.5 mL via INTRAMUSCULAR

## 2015-12-10 MED ORDER — NITROGLYCERIN 2 % TD OINT
0.5000 [in_us] | TOPICAL_OINTMENT | Freq: Three times a day (TID) | TRANSDERMAL | Status: DC
  Filled 2015-12-10: qty 30

## 2015-12-10 MED ORDER — ATORVASTATIN CALCIUM 40 MG PO TABS
40.0000 mg | ORAL_TABLET | Freq: Every day | ORAL | Status: DC
  Administered 2015-12-10: 40 mg via ORAL
  Filled 2015-12-10: qty 1

## 2015-12-10 MED ORDER — DIPHENHYDRAMINE HCL 25 MG PO CAPS
25.0000 mg | ORAL_CAPSULE | Freq: Every evening | ORAL | Status: DC | PRN
  Administered 2015-12-10 (×2): 25 mg via ORAL
  Filled 2015-12-10 (×2): qty 1

## 2015-12-10 MED ORDER — ACETAMINOPHEN 325 MG PO TABS
650.0000 mg | ORAL_TABLET | ORAL | Status: DC | PRN
  Administered 2015-12-10 – 2015-12-11 (×2): 650 mg via ORAL
  Filled 2015-12-10 (×2): qty 2

## 2015-12-10 NOTE — Consult Note (Signed)
Reason for Consult: Chest pain/new-onset A. fib Referring Physician: [facility] hospitalist  Sharon Cain is an 60 y.o. female.  HPI: Patient is 60 year old female with past medical history significant for hypertension, hyperlipidemia, anxiety disorder, depression, tobacco abuse, came to the ER complaining of Retrosternal chest heaviness radiating to jaw associated with exertion irregular heartbeat. 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 Patient was noted to be in A. fib with moderate ventricular response. Patient gives history of exertional chest pain and dyspnea. Denies any thyroid problems or history of rheumatic fever as child. States was told to be hypertensive many years ago but has not taken any medication. Patient denies any PND orthopnea next swelling. Denies any syncopal episode. Denies any fever cough cold. Patient received IV amiodarone in the ED and was started on drip with conversion to sinus rhythm. Past Medical History:  Diagnosis Date  . Anxiety   . Arthritis   . Chest pain, central 11/15/2010   Patient admitted to hospital for cardiac rule-out. No MI.    . Depression   . DEPRESSION 04/02/2007   Qualifier: Diagnosis of  By: Henderson Baltimore MD, Janett Billow    . Hyperlipidemia   . Hypertension   . Insomnia   . Obesity     Past Surgical History:  Procedure Laterality Date  . CARPAL TUNNEL RELEASE     bilateral  . CHOLECYSTECTOMY      Family History  Problem Relation Age of Onset  . Atrial fibrillation Mother   . Diabetes Mother   . Heart failure Mother   . COPD Mother   . COPD Father   . Congestive Heart Failure Father   . Sudden Cardiac Death Father     Social History:  reports that she has been smoking Cigarettes.  She has been smoking about 1.00 pack per day. She has never used smokeless tobacco. She reports that she does not drink alcohol or use drugs.  Allergies: No Known  Allergies  Medications: I have reviewed the patient's current medications.  Results for orders placed or performed during the hospital encounter of 12/09/15 (from the past 48 hour(s))  Basic metabolic panel     Status: Abnormal   Collection Time: 12/09/15  7:50 PM  Result Value Ref Range   Sodium 142 135 - 145 mmol/L   Potassium 3.2 (L) 3.5 - 5.1 mmol/L   Chloride 108 101 - 111 mmol/L   CO2 23 22 - 32 mmol/L   Glucose, Bld 145 (H) 65 - 99 mg/dL   BUN 12 6 - 20 mg/dL   Creatinine, Ser 0.82 0.44 - 1.00 mg/dL   Calcium 10.2 8.9 - 10.3 mg/dL   GFR calc non Af Amer >60 >60 mL/min   GFR calc Af Amer >60 >60 mL/min    Comment: (NOTE) The eGFR has been calculated using the CKD EPI equation. This calculation has not been validated in all clinical situations. eGFR's persistently <60 mL/min signify possible Chronic Kidney Disease.    Anion gap 11 5 - 15  CBC     Status: Abnormal   Collection Time: 12/09/15  7:50 PM  Result Value Ref Range   WBC 8.8 4.0 - 10.5 K/uL   RBC 5.02 3.87 - 5.11 MIL/uL   Hemoglobin 15.8 (H) 12.0 - 15.0 g/dL   HCT 46.7 (H) 36.0 - 46.0 %   MCV 93.0 78.0 - 100.0 fL   MCH 31.5  26.0 - 34.0 pg   MCHC 33.8 30.0 - 36.0 g/dL   RDW 13.6 11.5 - 15.5 %   Platelets 173 150 - 400 K/uL  Troponin I     Status: None   Collection Time: 12/09/15  7:50 PM  Result Value Ref Range   Troponin I <0.03 <0.03 ng/mL  Urinalysis, Routine w reflex microscopic (not at [facility])     Status: Abnormal   Collection Time: 12/09/15  8:00 PM  Result Value Ref Range   Color, Urine YELLOW YELLOW   APPearance CLEAR CLEAR   Specific Gravity, Urine 1.002 (L) 1.005 - 1.030   pH 6.5 5.0 - 8.0   Glucose, UA NEGATIVE NEGATIVE mg/dL   Hgb urine dipstick TRACE (A) NEGATIVE   Bilirubin Urine NEGATIVE NEGATIVE   Ketones, ur NEGATIVE NEGATIVE mg/dL   Protein, ur NEGATIVE NEGATIVE mg/dL   Nitrite NEGATIVE NEGATIVE   Leukocytes, UA NEGATIVE NEGATIVE  Urine microscopic-add on     Status: Abnormal    Collection Time: 12/09/15  8:00 PM  Result Value Ref Range   Squamous Epithelial / LPF 0-5 (A) NONE SEEN   WBC, UA NONE SEEN 0 - 5 WBC/hpf   RBC / HPF 0-5 0 - 5 RBC/hpf   Bacteria, UA NONE SEEN NONE SEEN  Troponin I-serum (0, 3, 6 hours)     Status: None   Collection Time: 12/10/15  2:08 AM  Result Value Ref Range   Troponin I <0.03 <0.03 ng/mL  Troponin I-serum (0, 3, 6 hours)     Status: None   Collection Time: 12/10/15  4:36 AM  Result Value Ref Range   Troponin I <0.03 <0.03 ng/mL    Dg Chest 2 View  Result Date: 12/09/2015 CLINICAL DATA:  Chest pain and shortness of breath. EXAM: CHEST  2 VIEW COMPARISON:  06/04/2013 and prior radiograph FINDINGS: Cardiomegaly again noted. Mild interstitial opacities bilaterally may represent mild interstitial edema. No pleural effusion, airspace disease or pneumothorax identified. No acute bony abnormalities are present. IMPRESSION: Cardiomegaly with mild interstitial opacities -question mild interstitial edema. Electronically Signed   By: Margarette Canada M.D.   On: 12/09/2015 20:26    Review of Systems  Constitutional: Negative for chills and fever.  Eyes: Negative for double vision.  Respiratory: Negative for shortness of breath.   Cardiovascular: Positive for chest pain and palpitations. Negative for leg swelling.  Gastrointestinal: Negative for nausea and vomiting.  Genitourinary: Negative for dysuria.  Neurological: Negative for dizziness and headaches.   Blood pressure 131/88, pulse (!) 51, temperature 98.6 F (37 C), resp. rate 15, height 5' (1.524 m), weight 165 lb (74.8 kg), SpO2 99 %. Physical Exam  Constitutional: She is oriented to person, place, and time.  HENT:  Head: Normocephalic and atraumatic.  Eyes: Conjunctivae are normal. Pupils are equal, round, and reactive to light. Left eye exhibits no discharge.  Neck: Normal range of motion. Neck supple. No tracheal deviation present. No thyromegaly present.  Cardiovascular:   Bradycardic S1-S2 soft there is soft systolic murmur noted  Respiratory: Effort normal and breath sounds normal. No respiratory distress. She has no wheezes. She has no rales.  GI: Soft. Bowel sounds are normal. She exhibits no distension. There is no tenderness. There is no rebound.  Musculoskeletal: She exhibits no edema, tenderness or deformity.  Neurological: She is alert and oriented to person, place, and time.    Assessment/Plan: New-onset angina rule out MI Paroxysmal atrial fibrillation. Hypertension Hyperlipidemia Depression next and anxiety disorder Tobacco abuse  Plan Switch amiodarone to by mouth as per orders Hold beta blockers in view of bradycardia Start amlodipine 5 mg daily Start statins Start nitroglycerin paste half inch every 8 hours Schedule for Lexiscan Myoview Check TSH, lipid panel Check 2-D echo check LV systolic function and wall motion abnormalities and left atrial size  Sharon Cain, Prudencio Burly 12/10/2015, 9:02 AM

## 2015-12-10 NOTE — Progress Notes (Signed)
ANTICOAGULATION CONSULT NOTE - Initial Consult  Pharmacy Consult for Heparin Indication: atrial fibrillation  No Known Allergies  Patient Measurements: Height: 5' (152.4 cm) Weight: 165 lb (74.8 kg) IBW/kg (Calculated) : 45.5 Heparin Dosing Weight: 65 kg  Vital Signs: Temp: 98.6 F (37 C) (09/16 1944) BP: 116/95 (09/16 2320) Pulse Rate: 95 (09/16 2320)  Labs:  Recent Labs  12/09/15 1950  HGB 15.8*  HCT 46.7*  PLT 173  CREATININE 0.82  TROPONINI <0.03    Estimated Creatinine Clearance: 65.1 mL/min (by C-G formula based on SCr of 0.82 mg/dL).   Medical History: Past Medical History:  Diagnosis Date  . Anxiety   . Arthritis   . Chest pain, central 11/15/2010   Patient admitted to hospital for cardiac rule-out. No MI.    . Depression   . DEPRESSION 04/02/2007   Qualifier: Diagnosis of  By: Henderson Baltimore MD, Janett Billow    . Hyperlipidemia   . Hypertension   . Insomnia   . Obesity     Medications:  No current facility-administered medications on file prior to encounter.    Current Outpatient Prescriptions on File Prior to Encounter  Medication Sig Dispense Refill  . aspirin 81 MG EC tablet Take 81 mg by mouth daily.      . busPIRone (BUSPAR) 10 MG tablet TAKE ONE TABLET BY MOUTH THREE TIMES DAILY AS NEEDED (Patient taking differently: Take 10 mg by mouth three times a day as needed for anxiety) 30 tablet 0  . Omega-3 Fatty Acids (FISH OIL) 1000 MG CAPS Take 2 capsules by mouth every morning.     Marland Kitchen ibuprofen (ADVIL,MOTRIN) 600 MG tablet TAKE ONE TABLET BY MOUTH EVERY 6 HOURS WITH FOOD AS NEEDED FOR PAIN (Patient not taking: Reported on 12/09/2015) 60 tablet 3     Assessment: 60 y.o. female with chest pain/new onset Afib for heparin  Goal of Therapy:  Heparin level 0.3-0.7 units/ml Monitor platelets by anticoagulation protocol: Yes   Plan:  Heparin 3000 units IV bolus, then start heparin 1000 units/hr Check heparin level in 6 hours.   Caryl Pina 12/10/2015,12:42 AM

## 2015-12-10 NOTE — Progress Notes (Addendum)
[place] for Heparin Indication: atrial fibrillation  No Known Allergies  Patient Measurements: Height: 5' (152.4 cm) Weight: 171 lb 12.8 oz (77.9 kg) IBW/kg (Calculated) : 45.5 Heparin Dosing Weight: 65 kg  Vital Signs: Temp: 97.9 F (36.6 C) (09/17 1419) Temp Source: Oral (09/17 1419) BP: 144/98 (09/17 1419) Pulse Rate: 56 (09/17 1419)  Labs:  Recent Labs  12/09/15 1950 12/10/15 0208 12/10/15 0436 12/10/15 0904 12/10/15 0905 12/10/15 1438  HGB 15.8*  --   --   --  14.0  --   HCT 46.7*  --   --   --  42.1  --   PLT 173  --   --   --  139*  --   HEPARINUNFRC  --   --   --  0.48  --  0.47  CREATININE 0.82  --   --   --  0.59  --   TROPONINI <0.03 <0.03 <0.03  --  <0.03  --     Estimated Creatinine Clearance: 68.2 mL/min (by C-G formula based on SCr of 0.59 mg/dL).   Medical History: Past Medical History:  Diagnosis Date  . Anxiety   . Arthritis   . Chest pain, central 11/15/2010   Patient admitted to hospital for cardiac rule-out. No MI.    . Depression   . DEPRESSION 04/02/2007   Qualifier: Diagnosis of  By: Henderson Baltimore MD, Janett Billow    . Hyperlipidemia   . Hypertension   . Insomnia   . Obesity     Medications:  . heparin 1,000 Units/hr (12/10/15 0202)      Assessment: 60 y.o. female with chest pain/new onset Afib for heparin.  Heparin level remains at goal on heparin at 1000 units/hr.  No bleeding or complications noted.   Goal of Therapy:  Heparin level 0.3-0.7 units/ml Monitor platelets by anticoagulation protocol: Yes   Plan:  Plan:  Con't heparin 1000 units/hr. Daily heparin level and CBC. F/u plans for oral anticoagulation.  Uvaldo Rising, BCPS  Clinical Pharmacist Pager [phone]  12/10/2015 4:27 PM

## 2015-12-10 NOTE — ED Notes (Signed)
Ordered heart healthy diet for patient.

## 2015-12-10 NOTE — ED Notes (Signed)
Dr Loleta Books notified that patient has converted to SB w/pvc's.  Patient sleeping and asymptomatic.  Advised to keep amiodarone drip going for now.  To continue to monitor.

## 2015-12-10 NOTE — Progress Notes (Addendum)
Patient seen and examined   60 y.o. female with a hx of anxiety, depression, HTN, hyperlipidemia,  presents to the Emergency Department complaining of gradual, persistent, progressively worsening chest pain onset 6:30pm. Associated symptoms include left jaw pain, chest heaviness, left arm heaviness. Chest x-ray mild interstitial opacities -question mild interstitial edema. EKG showed atrial fibrillation. Initiated on heparin infusion and amiodarone drip in the ER. Cardiology consulted  Plan 2-D echo TSH Patient may need long-term anticoagulation-cardiology to make that determination, patient seen by  Dr Criss Rosales

## 2015-12-10 NOTE — H&P (Signed)
History and Physical  Patient Name: Sharon Cain     [id number]    DOB: October 04, 1955    DOA: 12/09/2015 PCP: Velna Hatchet, MD   Patient coming from: Home  Chief Complaint: Chest heaviness and irregular heart beat  HPI: Sharon Cain is a 60 y.o. female with a past medical history significant for smoking and anxiety who presents with chest heaviness with exertion and irregular heart beats.  The patient has had intermittent, exertional chest heaviness over the last month. She notices this for example when she rides her bike or mows the lawn and it resolves with rest.  Tonight, the pain began again without heavy exertion this time, and persisted, and was more severe than previously and radiated to the left jaw.  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  ED course: -Afebrile, heart rate irregular and 80 to 130, respirations, blood pressure and pulse oximetry normal -Na 142, K 3.2, Cr 0.8, WBC 8.8K, Hgb 15.8, troponin negative -UA clear -CXR with mild interstitial prominence suggesting edema -ECG showed new AFib rate 96, without ST depressions, and her telemetry monitoring confirmed Afib with rates >120 with exertion -The case was discussed with Dr. Terrence Dupont, Cardiology, who recommended initiating amiodarone and heparin, and TRH were asked to evaluate for admission  The patient has a previous history of HTN, not currently on medicine, denies elevated BP for years.  She has never had a heart attack, stroke, or previous Afib.  She smokes currently.  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     ROS: Review of Systems  Constitutional: Negative for chills, diaphoresis, fever and malaise/fatigue.  Respiratory: Positive for shortness of breath. Negative for cough, hemoptysis, sputum production and wheezing.   Cardiovascular:  Positive for chest pain (heaviness, exertional) and palpitations. Negative for orthopnea, claudication, leg swelling and PND.  Neurological: Negative for dizziness and loss of consciousness.  Endo/Heme/Allergies: Negative.   All other systems reviewed and are negative.         Past Medical History:  Diagnosis Date  . Anxiety   . Arthritis   . Chest pain, central 11/15/2010   Patient admitted to hospital for cardiac rule-out. No MI.    . Depression   . DEPRESSION 04/02/2007   Qualifier: Diagnosis of  By: Henderson Baltimore MD, Janett Billow    . Hyperlipidemia   . Hypertension   . Insomnia   . Obesity     Past Surgical History:  Procedure Laterality Date  . CARPAL TUNNEL RELEASE     bilateral  . CHOLECYSTECTOMY      Social History: Patient lives with her mother and husband.  The patient walks unassisted.  She smokes again.  She does not drink or do drugs.  She recently lost her job and insurance and has been starting a new job at [employer] just this past week.    No Known Allergies  Family history: family history includes Atrial fibrillation in her mother; COPD in her father and mother; Congestive Heart Failure in her father; Diabetes in her mother; Heart failure in her mother; Sudden Cardiac Death in her father.  Prior to Admission medications   Medication Sig Start Date End Date Taking? Authorizing Provider  aspirin 81 MG EC tablet Take 81 mg by mouth daily.     Yes Historical Provider,  MD  busPIRone (BUSPAR) 10 MG tablet TAKE ONE TABLET BY MOUTH THREE TIMES DAILY AS NEEDED Patient taking differently: Take 10 mg by mouth three times a day as needed for anxiety 01/02/15  Yes Rosemarie Ax, MD  CALCIUM PO Take 1 tablet by mouth daily.   Yes Historical Provider, MD  Cholecalciferol (VITAMIN D-3 PO) Take 1 capsule by mouth every morning.   Yes Historical Provider, MD  Omega-3 Fatty Acids (FISH OIL) 1000 MG CAPS Take 2 capsules by mouth every morning.    Yes Historical Provider, MD  ibuprofen  (ADVIL,MOTRIN) 600 MG tablet TAKE ONE TABLET BY MOUTH EVERY 6 HOURS WITH FOOD AS NEEDED FOR PAIN Patient not taking: Reported on 12/09/2015 07/31/10   Alycia Rossetti, MD       Physical Exam: BP 115/85   Pulse 83   Temp 98.6 F (37 C)   Resp (!) 28   Ht 5' (1.524 m)   Wt 74.8 kg (165 lb)   SpO2 96%   BMI 32.22 kg/m  General appearance: Well-developed, obese elderly adult female, alert and in no acute distress.   Eyes: Anicteric, conjunctiva pink, lids and lashes normal. PERRL.    ENT: No nasal deformity, discharge, epistaxis.  Hearing normal. OP moist without lesions.  Dentition poor. Neck: No neck masses.  Trachea midline.  No thyromegaly/tenderness. Lymph: No cervical or supraclavicular lymphadenopathy. Skin: Warm and dry.  No jaundice.  No suspicious rashes or lesions. Cardiac: RRR, nl S1-S2, no murmurs appreciated.  Capillary refill is brisk.  JVP normal.  No LE edema.  Radial and DP pulses 2+ and symmetric.  No carotid bruits.  No pain with palpation of precordium. Respiratory: Normal respiratory rate and rhythm.  CTAB without rales or wheezes. Abdomen: Abdomen soft.  No TTP. No ascites, distension, hepatosplenomegaly.   MSK: No deformities or effusions.  No cyanosis or clubbing. Neuro: Cranial nerves normal except both eyes do not abduct.  Sensation intact to light touch. Speech is fluent.  Muscle strength normal.    Psych: Sensorium intact and responding to questions, attention normal.  Behavior appropriate.  Affect normal.  Judgment and insight appear normal.     Labs on Admission:  I have personally reviewed following labs and imaging studies: CBC:  Recent Labs Lab 12/09/15 1950  WBC 8.8  HGB 15.8*  HCT 46.7*  MCV 93.0  PLT [id number]   Basic Metabolic Panel:  Recent Labs Lab 12/09/15 1950  NA 142  K 3.2*  CL 108  CO2 23  GLUCOSE 145*  BUN 12  CREATININE 0.82  CALCIUM 10.2   GFR: Estimated Creatinine Clearance: 65.1 mL/min (by C-G formula based on SCr of  0.82 mg/dL).  Cardiac Enzymes:  Recent Labs Lab 12/09/15 1950  TROPONINI <0.03         Radiological Exams on Admission: Personally reviewed and shows mild interstitial prominence: Dg Chest 2 View  Result Date: 12/09/2015 CLINICAL DATA:  Chest pain and shortness of breath. EXAM: CHEST  2 VIEW COMPARISON:  06/04/2013 and prior radiograph FINDINGS: Cardiomegaly again noted. Mild interstitial opacities bilaterally may represent mild interstitial edema. No pleural effusion, airspace disease or pneumothorax identified. No acute bony abnormalities are present. IMPRESSION: Cardiomegaly with mild interstitial opacities -question mild interstitial edema. Electronically Signed   By: Margarette Canada M.D.   On: 12/09/2015 20:26    EKG: Independently reviewed. Rate 96, QTc 442, atrial fibrillation, no ST segment depression, some diffuse repolarization abnormalities.    Assessment/Plan 1. Atrial fibrillation, new  onset:  Unknown duration.  No previous history.  CHADS2Vasc 1 for gender.   -Consult to Cardiology -Continue amiodarone started by Cardiology -Continue heparin started by Cardiology -Check TSH, BNP, magnesium -Replete K -Echocardiogram will defer to Cardiology -Cycle troponins    2. Chest pain:  Unclear if this is symptomatic atrial fibrillation or angina. -Cycle enzymes -Consult to Cardiology, appreciate cares -Counseled on smoking cessation -Continue aspirin  3. Hypokalemia:  Repleting -Check mag and repeat BMP  4. Anxiety: Takes Buspar only PRN    DVT prophylaxis: Not needed, on heparin  Code Status: FULL  Family Communication: None presetn  Disposition Plan: Anticipate IV heparin and amiodarone.  Consult to Cardiology tomorrow.  Furthe disposition per Cardiology. Consults called: Cardiology, Dr. Terrence Dupont Admission status: INPATIENT, stepdown       Medical decision making: Patient seen at 1:00 AM on 12/10/2015.  The patient was discussed with Abigail Butts, PA-C.  What exists of the patient's chart was reviewed in depth and summarized above.  Clinical condition: stable.        Edwin Dada [facility] Pager [phone]

## 2015-12-11 ENCOUNTER — Inpatient Hospital Stay (HOSPITAL_COMMUNITY): Payer: Self-pay

## 2015-12-11 ENCOUNTER — Inpatient Hospital Stay (HOSPITAL_COMMUNITY): Payer: BLUE CROSS/BLUE SHIELD

## 2015-12-11 DIAGNOSIS — R072 Precordial pain: Secondary | ICD-10-CM

## 2015-12-11 DIAGNOSIS — I4891 Unspecified atrial fibrillation: Secondary | ICD-10-CM

## 2015-12-11 LAB — COMPREHENSIVE METABOLIC PANEL
ALBUMIN: 3.5 g/dL (ref 3.5–5.0)
ALT: 36 U/L (ref 14–54)
AST: 30 U/L (ref 15–41)
Alkaline Phosphatase: 66 U/L (ref 38–126)
Anion gap: 6 (ref 5–15)
BILIRUBIN TOTAL: 0.7 mg/dL (ref 0.3–1.2)
BUN: 9 mg/dL (ref 6–20)
CO2: 25 mmol/L (ref 22–32)
CREATININE: 0.55 mg/dL (ref 0.44–1.00)
Calcium: 9.2 mg/dL (ref 8.9–10.3)
Chloride: 108 mmol/L (ref 101–111)
GFR calc Af Amer: >60 mL/min (ref >60)
GLUCOSE: 88 mg/dL (ref 65–99)
POTASSIUM: 4 mmol/L (ref 3.5–5.1)
Sodium: 139 mmol/L (ref 135–145)
TOTAL PROTEIN: 5.7 g/dL — AB (ref 6.5–8.1)

## 2015-12-11 LAB — CBC
HEMATOCRIT: 40.8 % (ref 36.0–46.0)
HEMOGLOBIN: 13.1 g/dL (ref 12.0–15.0)
MCH: 30.5 pg (ref 26.0–34.0)
MCHC: 32.1 g/dL (ref 30.0–36.0)
MCV: 95.1 fL (ref 78.0–100.0)
Platelets: 147 10*3/uL — ABNORMAL LOW (ref 150–400)
RBC: 4.29 MIL/uL (ref 3.87–5.11)
RDW: 13.7 % (ref 11.5–15.5)
WBC: 6.3 10*3/uL (ref 4.0–10.5)

## 2015-12-11 LAB — ECHOCARDIOGRAM COMPLETE
HEIGHTINCHES: 60 in
WEIGHTICAEL: 2748.8 [oz_av]

## 2015-12-11 LAB — HEPARIN LEVEL (UNFRACTIONATED): HEPARIN UNFRACTIONATED: 0.41 [IU]/mL (ref 0.30–0.70)

## 2015-12-11 MED ORDER — AMIODARONE HCL 200 MG PO TABS: 200.0000 mg | ORAL_TABLET | Freq: Every day | ORAL | Status: DC

## 2015-12-11 MED ORDER — CARVEDILOL 3.125 MG PO TABS: 3.1250 mg | ORAL_TABLET | Freq: Two times a day (BID) | ORAL | 2 refills | Status: DC

## 2015-12-11 MED ORDER — APIXABAN 5 MG PO TABS: 5.0000 mg | ORAL_TABLET | Freq: Two times a day (BID) | ORAL | 3 refills | Status: DC

## 2015-12-11 MED ORDER — TECHNETIUM TC 99M TETROFOSMIN IV KIT
30.0000 | PACK | Freq: Once | INTRAVENOUS | Status: AC | PRN
  Administered 2015-12-11: 30 via INTRAVENOUS

## 2015-12-11 MED ORDER — LISINOPRIL 2.5 MG PO TABS: 2.5000 mg | ORAL_TABLET | Freq: Every day | ORAL | 2 refills | Status: DC

## 2015-12-11 MED ORDER — TECHNETIUM TC 99M TETROFOSMIN IV KIT
10.0000 | PACK | Freq: Once | INTRAVENOUS | Status: AC | PRN
  Administered 2015-12-11: 10 via INTRAVENOUS

## 2015-12-11 MED ORDER — REGADENOSON 0.4 MG/5ML IV SOLN
INTRAVENOUS | Status: AC
  Filled 2015-12-11: qty 5

## 2015-12-11 MED ORDER — LISINOPRIL 2.5 MG PO TABS: 2.5000 mg | ORAL_TABLET | Freq: Every day | ORAL | Status: DC

## 2015-12-11 MED ORDER — REGADENOSON 0.4 MG/5ML IV SOLN
0.4000 mg | Freq: Once | INTRAVENOUS | Status: DC
  Filled 2015-12-11: qty 5

## 2015-12-11 MED ORDER — AMLODIPINE BESYLATE 5 MG PO TABS: 5.0000 mg | ORAL_TABLET | Freq: Every day | ORAL | 1 refills | Status: DC

## 2015-12-11 MED ORDER — ATORVASTATIN CALCIUM 40 MG PO TABS: 40.0000 mg | ORAL_TABLET | Freq: Every day | ORAL | 1 refills | Status: DC

## 2015-12-11 MED ORDER — APIXABAN 5 MG PO TABS
5.0000 mg | ORAL_TABLET | Freq: Two times a day (BID) | ORAL | Status: DC
  Administered 2015-12-11: 5 mg via ORAL
  Filled 2015-12-11: qty 1

## 2015-12-11 MED ORDER — AMIODARONE HCL 200 MG PO TABS: 200.0000 mg | ORAL_TABLET | Freq: Every day | ORAL | 3 refills | Status: DC

## 2015-12-11 MED ORDER — NICOTINE 14 MG/24HR TD PT24: 14.0000 mg | MEDICATED_PATCH | Freq: Every day | TRANSDERMAL | 0 refills | Status: DC

## 2015-12-11 MED ORDER — CARVEDILOL 3.125 MG PO TABS: 3.1250 mg | ORAL_TABLET | Freq: Two times a day (BID) | ORAL | Status: DC

## 2015-12-11 NOTE — Progress Notes (Signed)
Subjective:  Patient seen in the medicine department denies any chest pain at present states had light chest pain yesterday without associated symptoms.  Objective:  Vital Signs in the last 24 hours: Temp:  [97.3 F (36.3 C)-98 F (36.7 C)] 97.3 F (36.3 C) (09/18 0617) Pulse Rate:  [52-70] 68 (09/18 1131) Resp:  [16-18] 18 (09/18 0617) BP: (102-145)/(68-98) 119/70 (09/18 1131) SpO2:  [97 %-100 %] 100 % (09/18 0617)  Intake/Output from previous day: 09/17 0701 - 09/18 0700 In: 303.7 [I.V.:303.7] Out: -  Intake/Output from this shift: No intake/output data recorded.  Physical Exam: Neck: no adenopathy, no carotid bruit, no JVD and supple, symmetrical, trachea midline Lungs: clear to auscultation bilaterally Heart: regular rate and rhythm, S1, S2 normal and Soft systolic murmur noted Abdomen: soft, non-tender; bowel sounds normal; no masses,  no organomegaly Extremities: extremities normal, atraumatic, no cyanosis or edema  Lab Results:  Recent Labs  12/10/15 0905 12/11/15 0211  WBC 6.9 6.3  HGB 14.0 13.1  PLT 139* 147*    Recent Labs  12/10/15 0905 12/11/15 0211  NA 142 139  K 3.7 4.0  CL 108 108  CO2 25 25  GLUCOSE 115* 88  BUN 12 9  CREATININE 0.59 0.55    Recent Labs  12/10/15 0436 12/10/15 0905  TROPONINI <0.03 <0.03   Hepatic Function Panel  Recent Labs  12/11/15 0211  PROT 5.7*  ALBUMIN 3.5  AST 30  ALT 36  ALKPHOS 66  BILITOT 0.7    Recent Labs  12/10/15 0941  CHOL 192   No results for input(s): PROTIME in the last 72 hours.  Imaging: Imaging results have been reviewed and Dg Chest 2 View  Result Date: 12/09/2015 CLINICAL DATA:  Chest pain and shortness of breath. EXAM: CHEST  2 VIEW COMPARISON:  06/04/2013 and prior radiograph FINDINGS: Cardiomegaly again noted. Mild interstitial opacities bilaterally may represent mild interstitial edema. No pleural effusion, airspace disease or pneumothorax identified. No acute bony  abnormalities are present. IMPRESSION: Cardiomegaly with mild interstitial opacities -question mild interstitial edema. Electronically Signed   By: Margarette Canada M.D.   On: 12/09/2015 20:26    Cardiac Studies:  Assessment/Plan:  New-onset angina MI ruled out Paroxysmal atrial fibrillation. Hypertension Hyperlipidemia Depression next and anxiety disorder Tobacco abuse Plan Continue present management Check nuclear stress test results if negative for ischemia May switch IV heparin to Eliquis. Reduce amiodarone to 200 mg daily in view of bradycardia. Okay to discharge from cardiac point of view if nuclear stress test is negative for ischemia.  LOS: 1 day    Charolette Forward 12/11/2015, 11:57 AM

## 2015-12-11 NOTE — Care Management Note (Signed)
Case Management Note Marvetta Gibbons RN, BSN Unit 2W-Case Manager [phone]  Patient Details  Name: Sharon Cain MRN: [id number] Date of Birth: 15-Aug-1955  Subjective/Objective:     Pt admitted with new onset afib               Action/Plan: PTA pt lived at home with spouse- independent- plan to return home- pt has been started on Eliquis- spoke with pt at bedside- per conversation pt states that she lost her job and insurance about 6 wks ago- has no insurance currently-- has a PCP- but is interested in getting orange card and current PCP does not give orange cards- pt states that she used to got to the [facility] clinic- would like to call and see if she can get an appointment there and reapply for an orange card- list of clinics given to pt that see uninsured pt and do orange cards. Pt also given the 30 day free card for Eliquis- along with the pt assistance application the fill out and take with her to her f/u appointment with Dr. Terrence Dupont- pt also eligible for MATCH to assist with new d/c meds- MATCH letter given to pt and copay $3 per script explained to pt. Pt states that she will be able to afford the copay and plans to use [facility].   Expected Discharge Date:     12/11/15             Expected Discharge Plan:  Home/Self Care  In-House Referral:     Discharge planning Services  CM Consult, Medication Assistance, [facility], [facility] Program  Post Acute Care Choice:    Choice offered to:     DME Arranged:    DME Agency:     HH Arranged:    HH Agency:     Status of Service:  Completed, signed off  If discussed at [employer], dates discussed:    Additional Comments:  Dawayne Patricia, RN 12/11/2015, 2:36 PM

## 2015-12-11 NOTE — Discharge Summary (Addendum)
Physician Discharge Summary  Sharon Cain MRN: [id number] DOB/AGE: 04/19/1955 60 y.o.  PCP: Velna Hatchet, MD   Admit date: 12/09/2015 Discharge date: 12/11/2015  Discharge Diagnoses:    Principal Problem:   New onset atrial fibrillation [facility]) Active Problems:   Anxiety state   Tobacco abuse   Hypokalemia   Atrial fibrillation ([place])    Follow-up recommendations Follow-up with PCP in 3-5 days , including all  additional recommended appointments as below Follow-up CBC, CMP in 3-5 days Patient is follow-up with Dr.Harwani, [facility] cardiology the outpatient setting      Current Discharge Medication List    START taking these medications   Details  amiodarone (PACERONE) 200 MG tablet Take 1 tablet (200 mg total) by mouth daily. Qty: 30 tablet, Refills: 3    apixaban (ELIQUIS) 5 MG TABS tablet Take 1 tablet (5 mg total) by mouth 2 (two) times daily. Qty: 60 tablet, Refills: 3    atorvastatin (LIPITOR) 40 MG tablet Take 1 tablet (40 mg total) by mouth daily at 6 PM. Qty: 30 tablet, Refills: 1    carvedilol (COREG) 3.125 MG tablet Take 1 tablet (3.125 mg total) by mouth 2 (two) times daily with a meal. Qty: 60 tablet, Refills: 2    lisinopril (PRINIVIL,ZESTRIL) 2.5 MG tablet Take 1 tablet (2.5 mg total) by mouth daily. Qty: 30 tablet, Refills: 2    nicotine (NICODERM CQ - DOSED IN MG/24 HOURS) 14 mg/24hr patch Place 1 patch (14 mg total) onto the skin daily. Qty: 28 patch, Refills: 0      CONTINUE these medications which have NOT CHANGED   Details  busPIRone (BUSPAR) 10 MG tablet TAKE ONE TABLET BY MOUTH THREE TIMES DAILY AS NEEDED Qty: 30 tablet, Refills: 0    CALCIUM PO Take 1 tablet by mouth daily.    Cholecalciferol (VITAMIN D-3 PO) Take 1 capsule by mouth every morning.    Omega-3 Fatty Acids (FISH OIL) 1000 MG CAPS Take 2 capsules by mouth every morning.       STOP taking these medications     aspirin 81 MG EC tablet      ibuprofen (ADVIL,MOTRIN)  600 MG tablet           Discharge Condition: Stable   Discharge Instructions Get Medicines reviewed and adjusted: Please take all your medications with you for your next visit with your Primary MD  Please request your Primary MD to go over all hospital tests and procedure/radiological results at the follow up, please ask your Primary MD to get all Hospital records sent to his/her office.  If you experience worsening of your admission symptoms, develop shortness of breath, life threatening emergency, suicidal or homicidal thoughts you must seek medical attention immediately by calling 911 or calling your MD immediately if symptoms less severe.  You must read complete instructions/literature along with all the possible adverse reactions/side effects for all the Medicines you take and that have been prescribed to you. Take any new Medicines after you have completely understood and accpet all the possible adverse reactions/side effects.   Do not drive when taking Pain medications.   Do not take more than prescribed Pain, Sleep and Anxiety Medications  Special Instructions: If you have smoked or chewed Tobacco in the last 2 yrs please stop smoking, stop any regular Alcohol and or any Recreational drug use.  Wear Seat belts while driving.  Please note  You were cared for by a hospitalist during your hospital stay. Once you are discharged,  your primary care physician will handle any further medical issues. Please note that NO REFILLS for any discharge medications will be authorized once you are discharged, as it is imperative that you return to your primary care physician (or establish a relationship with a primary care physician if you do not have one) for your aftercare needs so that they can reassess your need for medications and monitor your lab values.     No Known Allergies    Disposition: 01-Home or Self Care   Consults:  Cardiology     Significant Diagnostic  Studies:  Dg Chest 2 View  Result Date: 12/09/2015 CLINICAL DATA:  Chest pain and shortness of breath. EXAM: CHEST  2 VIEW COMPARISON:  06/04/2013 and prior radiograph FINDINGS: Cardiomegaly again noted. Mild interstitial opacities bilaterally may represent mild interstitial edema. No pleural effusion, airspace disease or pneumothorax identified. No acute bony abnormalities are present. IMPRESSION: Cardiomegaly with mild interstitial opacities -question mild interstitial edema. Electronically Signed   By: Margarette Canada M.D.   On: 12/09/2015 20:26    2-D echo-pending    Filed Weights   12/09/15 1945 12/10/15 1144  Weight: 74.8 kg (165 lb) 77.9 kg (171 lb 12.8 oz)     Microbiology: No results found for this or any previous visit (from the past 240 hour(s)).     Blood Culture No results found for: SDES, SPECREQUEST, CULT, REPTSTATUS    Labs: Results for orders placed or performed during the hospital encounter of 12/09/15 (from the past 48 hour(s))  Basic metabolic panel     Status: Abnormal   Collection Time: 12/09/15  7:50 PM  Result Value Ref Range   Sodium 142 135 - 145 mmol/L   Potassium 3.2 (L) 3.5 - 5.1 mmol/L   Chloride 108 101 - 111 mmol/L   CO2 23 22 - 32 mmol/L   Glucose, Bld 145 (H) 65 - 99 mg/dL   BUN 12 6 - 20 mg/dL   Creatinine, Ser 0.82 0.44 - 1.00 mg/dL   Calcium 10.2 8.9 - 10.3 mg/dL   GFR calc non Af Amer >60 >60 mL/min   GFR calc Af Amer >60 >60 mL/min    Comment: (NOTE) The eGFR has been calculated using the CKD EPI equation. This calculation has not been validated in all clinical situations. eGFR's persistently <60 mL/min signify possible Chronic Kidney Disease.    Anion gap 11 5 - 15  CBC     Status: Abnormal   Collection Time: 12/09/15  7:50 PM  Result Value Ref Range   WBC 8.8 4.0 - 10.5 K/uL   RBC 5.02 3.87 - 5.11 MIL/uL   Hemoglobin 15.8 (H) 12.0 - 15.0 g/dL   HCT 46.7 (H) 36.0 - 46.0 %   MCV 93.0 78.0 - 100.0 fL   MCH 31.5 26.0 - 34.0 pg    MCHC 33.8 30.0 - 36.0 g/dL   RDW 13.6 11.5 - 15.5 %   Platelets 173 150 - 400 K/uL  Troponin I     Status: None   Collection Time: 12/09/15  7:50 PM  Result Value Ref Range   Troponin I <0.03 <0.03 ng/mL  Urinalysis, Routine w reflex microscopic (not at [facility])     Status: Abnormal   Collection Time: 12/09/15  8:00 PM  Result Value Ref Range   Color, Urine YELLOW YELLOW   APPearance CLEAR CLEAR   Specific Gravity, Urine 1.002 (L) 1.005 - 1.030   pH 6.5 5.0 - 8.0   Glucose, UA NEGATIVE  NEGATIVE mg/dL   Hgb urine dipstick TRACE (A) NEGATIVE   Bilirubin Urine NEGATIVE NEGATIVE   Ketones, ur NEGATIVE NEGATIVE mg/dL   Protein, ur NEGATIVE NEGATIVE mg/dL   Nitrite NEGATIVE NEGATIVE   Leukocytes, UA NEGATIVE NEGATIVE  Urine microscopic-add on     Status: Abnormal   Collection Time: 12/09/15  8:00 PM  Result Value Ref Range   Squamous Epithelial / LPF 0-5 (A) NONE SEEN   WBC, UA NONE SEEN 0 - 5 WBC/hpf   RBC / HPF 0-5 0 - 5 RBC/hpf   Bacteria, UA NONE SEEN NONE SEEN  Troponin I-serum (0, 3, 6 hours)     Status: None   Collection Time: 12/10/15  2:08 AM  Result Value Ref Range   Troponin I <0.03 <0.03 ng/mL  Troponin I-serum (0, 3, 6 hours)     Status: None   Collection Time: 12/10/15  4:36 AM  Result Value Ref Range   Troponin I <0.03 <0.03 ng/mL  Heparin level (unfractionated)     Status: None   Collection Time: 12/10/15  9:04 AM  Result Value Ref Range   Heparin Unfractionated 0.48 0.30 - 0.70 IU/mL    Comment:        IF HEPARIN RESULTS ARE BELOW EXPECTED VALUES, AND PATIENT DOSAGE HAS BEEN CONFIRMED, SUGGEST FOLLOW UP TESTING OF ANTITHROMBIN III LEVELS.   Troponin I-serum (0, 3, 6 hours)     Status: None   Collection Time: 12/10/15  9:05 AM  Result Value Ref Range   Troponin I <0.03 <0.03 ng/mL  TSH     Status: None   Collection Time: 12/10/15  9:05 AM  Result Value Ref Range   TSH 2.163 0.350 - 4.500 uIU/mL  Magnesium     Status: None   Collection Time: 12/10/15   9:05 AM  Result Value Ref Range   Magnesium 2.1 1.7 - 2.4 mg/dL  Basic metabolic panel     Status: Abnormal   Collection Time: 12/10/15  9:05 AM  Result Value Ref Range   Sodium 142 135 - 145 mmol/L   Potassium 3.7 3.5 - 5.1 mmol/L   Chloride 108 101 - 111 mmol/L   CO2 25 22 - 32 mmol/L   Glucose, Bld 115 (H) 65 - 99 mg/dL   BUN 12 6 - 20 mg/dL   Creatinine, Ser 0.59 0.44 - 1.00 mg/dL   Calcium 9.2 8.9 - 10.3 mg/dL   GFR calc non Af Amer >60 >60 mL/min   GFR calc Af Amer >60 >60 mL/min    Comment: (NOTE) The eGFR has been calculated using the CKD EPI equation. This calculation has not been validated in all clinical situations. eGFR's persistently <60 mL/min signify possible Chronic Kidney Disease.    Anion gap 9 5 - 15  CBC     Status: Abnormal   Collection Time: 12/10/15  9:05 AM  Result Value Ref Range   WBC 6.9 4.0 - 10.5 K/uL   RBC 4.50 3.87 - 5.11 MIL/uL   Hemoglobin 14.0 12.0 - 15.0 g/dL   HCT 42.1 36.0 - 46.0 %   MCV 93.6 78.0 - 100.0 fL   MCH 31.1 26.0 - 34.0 pg   MCHC 33.3 30.0 - 36.0 g/dL   RDW 13.7 11.5 - 15.5 %   Platelets 139 (L) 150 - 400 K/uL  Lipid panel     Status: Abnormal   Collection Time: 12/10/15  9:41 AM  Result Value Ref Range   Cholesterol 192 0 - 200  mg/dL   Triglycerides 132 <150 mg/dL   HDL 35 (L) >40 mg/dL   Total CHOL/HDL Ratio 5.5 RATIO   VLDL 26 0 - 40 mg/dL   LDL Cholesterol 131 (H) 0 - 99 mg/dL    Comment:        Total Cholesterol/HDL:CHD Risk Coronary Heart Disease Risk Table                     Men   Women  1/2 Average Risk   3.4   3.3  Average Risk       5.0   4.4  2 X Average Risk   9.6   7.1  3 X Average Risk  23.4   11.0        Use the calculated Patient Ratio above and the CHD Risk Table to determine the patient's CHD Risk.        ATP III CLASSIFICATION (LDL):  <100     mg/dL   Optimal  100-129  mg/dL   Near or Above                    Optimal  130-159  mg/dL   Borderline  160-189  mg/dL   High  >190     mg/dL    Very High   Heparin level (unfractionated)     Status: None   Collection Time: 12/10/15  2:38 PM  Result Value Ref Range   Heparin Unfractionated 0.47 0.30 - 0.70 IU/mL    Comment:        IF HEPARIN RESULTS ARE BELOW EXPECTED VALUES, AND PATIENT DOSAGE HAS BEEN CONFIRMED, SUGGEST FOLLOW UP TESTING OF ANTITHROMBIN III LEVELS.   Heparin level (unfractionated)     Status: None   Collection Time: 12/11/15  2:11 AM  Result Value Ref Range   Heparin Unfractionated 0.41 0.30 - 0.70 IU/mL    Comment:        IF HEPARIN RESULTS ARE BELOW EXPECTED VALUES, AND PATIENT DOSAGE HAS BEEN CONFIRMED, SUGGEST FOLLOW UP TESTING OF ANTITHROMBIN III LEVELS.   CBC     Status: Abnormal   Collection Time: 12/11/15  2:11 AM  Result Value Ref Range   WBC 6.3 4.0 - 10.5 K/uL   RBC 4.29 3.87 - 5.11 MIL/uL   Hemoglobin 13.1 12.0 - 15.0 g/dL   HCT 40.8 36.0 - 46.0 %   MCV 95.1 78.0 - 100.0 fL   MCH 30.5 26.0 - 34.0 pg   MCHC 32.1 30.0 - 36.0 g/dL   RDW 13.7 11.5 - 15.5 %   Platelets 147 (L) 150 - 400 K/uL  Comprehensive metabolic panel     Status: Abnormal   Collection Time: 12/11/15  2:11 AM  Result Value Ref Range   Sodium 139 135 - 145 mmol/L   Potassium 4.0 3.5 - 5.1 mmol/L   Chloride 108 101 - 111 mmol/L   CO2 25 22 - 32 mmol/L   Glucose, Bld 88 65 - 99 mg/dL   BUN 9 6 - 20 mg/dL   Creatinine, Ser 0.55 0.44 - 1.00 mg/dL   Calcium 9.2 8.9 - 10.3 mg/dL   Total Protein 5.7 (L) 6.5 - 8.1 g/dL   Albumin 3.5 3.5 - 5.0 g/dL   AST 30 15 - 41 U/L   ALT 36 14 - 54 U/L   Alkaline Phosphatase 66 38 - 126 U/L   Total Bilirubin 0.7 0.3 - 1.2 mg/dL   GFR calc non Af Amer >60 >  60 mL/min   GFR calc Af Amer >60 >60 mL/min    Comment: (NOTE) The eGFR has been calculated using the CKD EPI equation. This calculation has not been validated in all clinical situations. eGFR's persistently <60 mL/min signify possible Chronic Kidney Disease.    Anion gap 6 5 - 15     Lipid Panel     Component Value  Date/Time   CHOL 192 12/10/2015 0941   TRIG 132 12/10/2015 0941   HDL 35 (L) 12/10/2015 0941   CHOLHDL 5.5 12/10/2015 0941   VLDL 26 12/10/2015 0941   LDLCALC 131 (H) 12/10/2015 0941   LDLDIRECT 149 (H) 02/27/2010 2015     Lab Results  Component Value Date   HGBA1C 5.3 06/04/2013   HGBA1C 5.9 (H) 11/16/2010        HPI    60 year old female with past medical history significant for hypertension, hyperlipidemia, anxiety disorder, depression, tobacco abuse, came to the ER complaining of Retrosternal chest heaviness radiating to jaw associated with exertion irregular heartbeat. 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 Patient was noted to be in A. fib with moderate ventricular response. Patient gives history of exertional chest pain and dyspnea. Denies any thyroid problems or history of rheumatic fever as child. States was told to be hypertensive many years ago but has not taken any medication. Patient denies any PND orthopnea next swelling. Denies any syncopal episode. Denies any fever cough cold. Patient received IV amiodarone in the ED and was started on drip with conversion to sinus rhythm  HOSPITAL COURSE  1. Atrial fibrillation, new onset: Chronic systolic HF  Unknown duration.  No previous history.  CHADS2Vasc 1 for gender.   Consultant cardiology, patient started on amiodarone and IV heparin Nuclear stress test ordered by cardiology, shows EF of 35% Cardiology recommended transitioning from  IV heparin to Eliquis  TSH within normal limits 2-D echo pending to confirm EF, cardiac enzymes cycled and were found to be negative in the mean time started on coreg and lisinopril to be titrated by cards outpt   2. Chest pain:  Unclear if this is symptomatic atrial fibrillation or angina. -Cycle enzymes negative Patient underwent nuclear stress test, EF 35%, Discussed with Dr Criss Rosales, ok to dc after echo   followup in his office in one week  3. Hypokalemia:  Repleted  4. Anxiety: Takes Buspar only PRN   Discharge Exam  Blood pressure 119/70, pulse 68, temperature 97.3 F (36.3 C), temperature source Oral, resp. rate 18, height 5' (1.524 m), weight 77.9 kg (171 lb 12.8 oz), SpO2 100 %. Cardiac: RRR, nl S1-S2, no murmurs appreciated.  Capillary refill is brisk.  JVP normal.  No LE edema.  Radial and DP pulses 2+ and symmetric.  No carotid bruits.  No pain with palpation of precordium. Respiratory: Normal respiratory rate and rhythm.  CTAB without rales or wheezes. Abdomen: Abdomen soft.  No TTP. No ascites, distension, hepatosplenomegaly.   MSK: No deformities or effusions.  No cyanosis or clubbing. Neuro: Cranial nerves normal except both eyes do not abduct.  Sensation intact to light touch. Speech is fluent.  Muscle strength normal.    Psych: Sensorium intact and responding to questions, attention normal.  Behavior appropriate.  Affect normal.  Judgment and insight appear normal     Follow-up Information    HOLWERDA, SCOTT, MD. Schedule an appointment as soon as possible for  a visit in 2 day(s).   Specialty:  Internal Medicine Why:  Hospital follow-up Contact information: [place] [address] [phone]        Charolette Forward, MD. Schedule an appointment as soon as possible for a visit in 2 day(s).   Specialty:  Cardiology Why:  Hospital follow-up Contact information: [address] [address] [phone]           Signed: Reyne Dumas 12/11/2015, 1:16 PM        Time spent >45 mins

## 2015-12-11 NOTE — Progress Notes (Signed)
[name] to be D/C'd Home per MD order. Discussed with the patient and all questions fully answered.    Medication List    STOP taking these medications   aspirin 81 MG EC tablet   ibuprofen 600 MG tablet Commonly known as:  ADVIL,MOTRIN     TAKE these medications   amiodarone 200 MG tablet Commonly known as:  PACERONE Take 1 tablet (200 mg total) by mouth daily.   apixaban 5 MG Tabs tablet Commonly known as:  ELIQUIS Take 1 tablet (5 mg total) by mouth 2 (two) times daily.   atorvastatin 40 MG tablet Commonly known as:  LIPITOR Take 1 tablet (40 mg total) by mouth daily at 6 PM.   busPIRone 10 MG tablet Commonly known as:  BUSPAR TAKE ONE TABLET BY MOUTH THREE TIMES DAILY AS NEEDED What changed:  See the new instructions.   CALCIUM PO Take 1 tablet by mouth daily.   carvedilol 3.125 MG tablet Commonly known as:  COREG Take 1 tablet (3.125 mg total) by mouth 2 (two) times daily with a meal.   Fish Oil 1000 MG Caps Take 2 capsules by mouth every morning.   lisinopril 2.5 MG tablet Commonly known as:  PRINIVIL,ZESTRIL Take 1 tablet (2.5 mg total) by mouth daily.   nicotine 14 mg/24hr patch Commonly known as:  NICODERM CQ - dosed in mg/24 hours Place 1 patch (14 mg total) onto the skin daily.   VITAMIN D-3 PO Take 1 capsule by mouth every morning.       VVS, Skin clean, dry and intact without evidence of skin break down, no evidence of skin tears noted.  IV catheter discontinued intact. Site without signs and symptoms of complications. Dressing and pressure applied.  An After Visit Summary was printed and given to the patient.  Patient escorted via [place], and D/C home via private auto.  Cyndra Numbers  12/11/2015 6:37 PM

## 2015-12-11 NOTE — Progress Notes (Signed)
  Echocardiogram 2D Echocardiogram has been performed.  Jennette Dubin 12/11/2015, 3:32 PM

## 2015-12-11 NOTE — Progress Notes (Addendum)
[place] for Heparin>Apixaban Indication: atrial fibrillation  No Known Allergies  Patient Measurements: Height: 5' (152.4 cm) Weight: 171 lb 12.8 oz (77.9 kg) IBW/kg (Calculated) : 45.5 Heparin Dosing Weight: 65 kg  Vital Signs: Temp: 97.3 F (36.3 C) (09/18 0617) Temp Source: Oral (09/18 0617) BP: 131/77 (09/18 0617) Pulse Rate: 52 (09/18 0617)  Labs:  Recent Labs  12/09/15 1950 12/10/15 0208 12/10/15 0436 12/10/15 0904 12/10/15 0905 12/10/15 1438 12/11/15 0211  HGB 15.8*  --   --   --  14.0  --  13.1  HCT 46.7*  --   --   --  42.1  --  40.8  PLT 173  --   --   --  139*  --  147*  HEPARINUNFRC  --   --   --  0.48  --  0.47 0.41  CREATININE 0.82  --   --   --  0.59  --  0.55  TROPONINI <0.03 <0.03 <0.03  --  <0.03  --   --     Estimated Creatinine Clearance: 68.2 mL/min (by C-G formula based on SCr of 0.55 mg/dL).   Medical History: Past Medical History:  Diagnosis Date  . Anxiety   . Arthritis   . Chest pain, central 11/15/2010   Patient admitted to hospital for cardiac rule-out. No MI.    . Depression   . DEPRESSION 04/02/2007   Qualifier: Diagnosis of  By: Henderson Baltimore MD, Janett Billow    . Hyperlipidemia   . Hypertension   . Insomnia   . Obesity     Medications:  . heparin 1,000 Units/hr (12/10/15 2226)      Assessment: 60 y.o. female with chest pain/new onset Afib continuing on therapeutic heparin. Not on anticoagulation PTA. Pharmacy consulted to transition to apixaban. Hg wnl, plt up 147. No bleeding or complications noted.   Goal of Therapy:  Stroke prevention Monitor platelets by anticoagulation protocol: Yes   Plan:  IV heparin > apixaban 5mg  BID - turn off heparin drip when 1st dose given (communicated with RN) Monitor CBC, s/sx bleeding  Elicia Lamp, PharmD, [facility] Clinical Pharmacist Pager [phone] 12/11/2015 9:27 AM

## 2015-12-12 ENCOUNTER — Ambulatory Visit (INDEPENDENT_AMBULATORY_CARE_PROVIDER_SITE_OTHER): Payer: Self-pay | Admitting: Internal Medicine

## 2015-12-12 ENCOUNTER — Encounter: Payer: Self-pay | Admitting: Internal Medicine

## 2015-12-12 VITALS — BP 120/70 | HR 56 | Temp 98.5°F | Resp 20 | Ht 59.25 in | Wt 166.0 lb

## 2015-12-12 DIAGNOSIS — E782 Mixed hyperlipidemia: Secondary | ICD-10-CM

## 2015-12-12 DIAGNOSIS — F329 Major depressive disorder, single episode, unspecified: Secondary | ICD-10-CM

## 2015-12-12 DIAGNOSIS — Z720 Tobacco use: Secondary | ICD-10-CM

## 2015-12-12 DIAGNOSIS — F32A Depression, unspecified: Secondary | ICD-10-CM

## 2015-12-12 DIAGNOSIS — E669 Obesity, unspecified: Secondary | ICD-10-CM

## 2015-12-12 DIAGNOSIS — F418 Other specified anxiety disorders: Secondary | ICD-10-CM

## 2015-12-12 DIAGNOSIS — F419 Anxiety disorder, unspecified: Principal | ICD-10-CM

## 2015-12-12 DIAGNOSIS — I4891 Unspecified atrial fibrillation: Secondary | ICD-10-CM

## 2015-12-12 DIAGNOSIS — F411 Generalized anxiety disorder: Secondary | ICD-10-CM

## 2015-12-12 DIAGNOSIS — R739 Hyperglycemia, unspecified: Secondary | ICD-10-CM | POA: Insufficient documentation

## 2015-12-12 NOTE — Addendum Note (Signed)
Addended by: Marcelino Duster on: 12/12/2015 06:02 PM   Modules accepted: Orders

## 2015-12-12 NOTE — Progress Notes (Addendum)
Subjective:    Patient ID: Sharon Cain, female    DOB: [birth year], 60 y.o.   MRN: [id number]  HPI Here to establish.  Previously followed most recently with Velna Hatchet at [facility], but no longer has insurance as she lost her job at the beginning of September.   Hospitalized 12/09/2015 with high sternal pain radiating into bilateral neck.  Listened to her heart with stethoscope.  Heartbeat was erratic and fast.  Was dyspneic as well. Went to ED and diagnoses with new onset atrial fibrillation, HR ranging from 80 to 130.   HR controlled with Amiodarone, ultimately Carvedilol. Underwent nuclear stress testing with results of diffuse hypokinesis of left ventricle and EF of 35%.   No reversible ischemia. Echo at discharge showed similar findings with EF of 40-45%. Was discharged yesterday evening. Little to no chest discomfort or symptomatology since discharge. Sees her cardiologist, Dr. Terrence Dupont tomorrow.  Distant history of essential hypertension, but with change in husband 10 years ago, was able to come off medication.  Was also dealing with long term depression and anxiety that improved at same time 10 years ago.  Has not been on any medications since then, save for Buspar for anxiety starting about 4-5 years ago related to another stressful job.  No smoking since 4 days ago.  2.  Chronic Anxiety and Depression:  Has had counseling in past, but nothing recent. Depression screen PHQ 2/9 12/12/2015  Decreased Interest 1  Down, Depressed, Hopeless 1  PHQ - 2 Score 2  Altered sleeping 1  Tired, decreased energy 1  Change in appetite 1  Feeling bad or failure about yourself  1  Trouble concentrating 0  Moving slowly or fidgety/restless 0  Suicidal thoughts 0  PHQ-9 Score 6  Difficult doing work/chores Not difficult at all   Current Meds  Medication Sig  . amiodarone (PACERONE) 200 MG tablet Take 1 tablet (200 mg total) by mouth daily.  Marland Kitchen apixaban (ELIQUIS) 5 MG TABS tablet Take 1  tablet (5 mg total) by mouth 2 (two) times daily.  Marland Kitchen atorvastatin (LIPITOR) 40 MG tablet Take 1 tablet (40 mg total) by mouth daily at 6 PM.  . busPIRone (BUSPAR) 10 MG tablet TAKE ONE TABLET BY MOUTH THREE TIMES DAILY AS NEEDED (Patient taking differently: Take 10 mg by mouth three times a day as needed for anxiety)  . CALCIUM PO Take 1 tablet by mouth daily.  . carvedilol (COREG) 3.125 MG tablet Take 1 tablet (3.125 mg total) by mouth 2 (two) times daily with a meal.  . Cholecalciferol (VITAMIN D-3 PO) Take 1 capsule by mouth every morning.  Marland Kitchen lisinopril (PRINIVIL,ZESTRIL) 2.5 MG tablet Take 1 tablet (2.5 mg total) by mouth daily.  . nicotine (NICODERM CQ - DOSED IN MG/24 HOURS) 14 mg/24hr patch Place 1 patch (14 mg total) onto the skin daily.  . Omega-3 Fatty Acids (FISH OIL) 1000 MG CAPS Take 2 capsules by mouth every morning.   . Potassium 99 MG TABS Take 1 tablet by mouth daily.   No Known Allergies Review of Systems     Objective:   Physical Exam  NAD, though tearful at times Lungs:  CTA CV:  RRR without murmur or rub, radial pulses normal and equal LE:  No edema        Assessment & Plan:  1.  New onset Atrial Fibrillation with dilated cardiomyopathy.  No definitive ischemia.  Currently in NSR and doing well.   Pt. Has her Eliquis application  in already and has a month free currently Continues on Amiodarone and Carvedilol. As per Dr. Terrence Dupont  2.  Hyperlipidemia:  Atorvastatin--send Rx to [facility].  Pt. To check with Dr. Terrence Dupont regarding labs he would like for [place] to do and send to him to save her money.  3.  Tobacco abuse:  Nicotine patches.  To get rid of cigarettes.  Encouraged couple to get her chain smoking mother out of the house when she smokes.  Suspect unlikely to happen.  4.  Hyperglycemia:  Focus today on changing lifestyle to improve her risk factors for heart disease.  Long discussion regarding diet and eating behaviors.  To stop sweet drinks and desserts--reward  herself once weekly instead.    5.  Depression/anxiety:  Referral to Macie Burows, LCSW To talk with Dr. Terrence Dupont regarding gradual increase of activity.

## 2015-12-12 NOTE — Patient Instructions (Signed)
Drink a glass of water before every meal Drink 6-8 glasses of water daily Eat three meals daily Eat a protein and healthy fat with every meal (eggs,fish, chicken, [place] and limit red meats) Eat 5 servings of vegetables daily, mix the colors Eat 2 servings of fruit daily with skin, if skin is edible Use smaller plates Put food/utensils down as you chew and swallow each bite Eat at a table with friends/family at least once daily, no TV Do not eat in front of the TV  Please ask Dr. Terrence Dupont whether he wants [place] to do labs--we can do and send to him

## 2015-12-15 ENCOUNTER — Telehealth: Payer: Self-pay | Admitting: Licensed Clinical Social Worker

## 2015-12-15 NOTE — Telephone Encounter (Signed)
LCSW called pt to set up counseling appointment; left a voicemail.

## 2015-12-18 ENCOUNTER — Telehealth: Payer: Self-pay | Admitting: Internal Medicine

## 2015-12-18 NOTE — Telephone Encounter (Signed)
Can you write this patient this kind of letter. Is there a template that I can print it for you to sign?

## 2015-12-18 NOTE — Telephone Encounter (Signed)
Does she want her lifting restricted to less than 25 pounds or that she is medically able to lift 25 lbs?--not clear in the note

## 2015-12-18 NOTE — Telephone Encounter (Signed)
Patient requests note from Dr. Amil Amen stating her lift restrictions for "lifting up to 25 pounds".  Patient will pick up note or have someone pick up for her.

## 2015-12-23 NOTE — Telephone Encounter (Signed)
Verbal information from [facility] earlier in week that patient no longer needed this letter.  Please confirm and document that that was you conversation with patient.

## 2015-12-26 NOTE — Telephone Encounter (Signed)
Pat spoke with patient via telephone.  Patient stated that she no longer needed the letter from Dr. Amil Amen regarding lifting restriction.

## 2016-01-08 ENCOUNTER — Telehealth: Payer: Self-pay | Admitting: Internal Medicine

## 2016-01-08 NOTE — Telephone Encounter (Signed)
Patient needs appointment back to [facility]

## 2016-01-08 NOTE — Telephone Encounter (Signed)
Patient called stating she thinks she has a UTI infection and would like Dr. Amil Amen to prescribe antibiotics or does she need to come in.Patient can only come in tomorrow since is her only day off. Please advise

## 2016-01-09 ENCOUNTER — Telehealth: Payer: Self-pay | Admitting: Internal Medicine

## 2016-01-09 ENCOUNTER — Other Ambulatory Visit (INDEPENDENT_AMBULATORY_CARE_PROVIDER_SITE_OTHER): Payer: Self-pay

## 2016-01-09 DIAGNOSIS — R350 Frequency of micturition: Secondary | ICD-10-CM

## 2016-01-09 LAB — POCT URINALYSIS DIPSTICK
Bilirubin, UA: NEGATIVE
GLUCOSE UA: NEGATIVE
Ketones, UA: NEGATIVE
NITRITE UA: NEGATIVE
PH UA: 7.5
SPEC GRAV UA: 1.01
UROBILINOGEN UA: 0.2

## 2016-01-09 MED ORDER — SULFAMETHOXAZOLE-TRIMETHOPRIM 800-160 MG PO TABS: 1.0000 | ORAL_TABLET | Freq: Two times a day (BID) | ORAL | 0 refills | Status: DC

## 2016-01-09 NOTE — Addendum Note (Signed)
Addended by: Ronn Melena on: 01/09/2016 01:01 PM   Modules accepted: Orders

## 2016-01-09 NOTE — Progress Notes (Signed)
Here due to urinary burning and frequency.  Some abdominal bloating and flank discomfort.   UA + for leuks, prot and blood. Sending for culture and starting Bactrim DS--avoid cipro due to possible QT prolongation with Amiodarone

## 2016-01-09 NOTE — Telephone Encounter (Signed)
Patient seen today

## 2016-01-11 LAB — URINE CULTURE

## 2016-01-12 NOTE — Telephone Encounter (Signed)
Patient scheduled to come in 01/09/16 @ 11:00 AM

## 2016-01-13 ENCOUNTER — Inpatient Hospital Stay (HOSPITAL_COMMUNITY)
Admission: EM | Admit: 2016-01-13 | Discharge: 2016-01-15 | DRG: 439 | Disposition: A | Discharge status: Home / Self Care | Payer: BLUE CROSS/BLUE SHIELD | Attending: Internal Medicine | Admitting: Internal Medicine

## 2016-01-13 ENCOUNTER — Ambulatory Visit (HOSPITAL_COMMUNITY)
Admission: EM | Admit: 2016-01-13 | Discharge: 2016-01-13 | Disposition: A | Discharge status: Nursing Home (Includes ICF) | Payer: BLUE CROSS/BLUE SHIELD

## 2016-01-13 ENCOUNTER — Emergency Department (HOSPITAL_COMMUNITY): Payer: BLUE CROSS/BLUE SHIELD

## 2016-01-13 ENCOUNTER — Encounter (HOSPITAL_COMMUNITY): Payer: Self-pay | Admitting: Emergency Medicine

## 2016-01-13 ENCOUNTER — Encounter (HOSPITAL_COMMUNITY): Payer: Self-pay

## 2016-01-13 DIAGNOSIS — Z8249 Family history of ischemic heart disease and other diseases of the circulatory system: Secondary | ICD-10-CM

## 2016-01-13 DIAGNOSIS — E785 Hyperlipidemia, unspecified: Secondary | ICD-10-CM | POA: Diagnosis present

## 2016-01-13 DIAGNOSIS — R1010 Upper abdominal pain, unspecified: Secondary | ICD-10-CM | POA: Diagnosis not present

## 2016-01-13 DIAGNOSIS — I110 Hypertensive heart disease with heart failure: Secondary | ICD-10-CM | POA: Diagnosis present

## 2016-01-13 DIAGNOSIS — Z825 Family history of asthma and other chronic lower respiratory diseases: Secondary | ICD-10-CM

## 2016-01-13 DIAGNOSIS — K859 Acute pancreatitis without necrosis or infection, unspecified: Secondary | ICD-10-CM | POA: Diagnosis present

## 2016-01-13 DIAGNOSIS — I480 Paroxysmal atrial fibrillation: Secondary | ICD-10-CM | POA: Diagnosis present

## 2016-01-13 DIAGNOSIS — Z87891 Personal history of nicotine dependence: Secondary | ICD-10-CM

## 2016-01-13 DIAGNOSIS — I5022 Chronic systolic (congestive) heart failure: Secondary | ICD-10-CM | POA: Diagnosis present

## 2016-01-13 DIAGNOSIS — E782 Mixed hyperlipidemia: Secondary | ICD-10-CM | POA: Diagnosis present

## 2016-01-13 DIAGNOSIS — G4700 Insomnia, unspecified: Secondary | ICD-10-CM | POA: Diagnosis present

## 2016-01-13 DIAGNOSIS — I4891 Unspecified atrial fibrillation: Secondary | ICD-10-CM | POA: Diagnosis present

## 2016-01-13 DIAGNOSIS — Z833 Family history of diabetes mellitus: Secondary | ICD-10-CM

## 2016-01-13 DIAGNOSIS — E669 Obesity, unspecified: Secondary | ICD-10-CM | POA: Diagnosis present

## 2016-01-13 DIAGNOSIS — K8500 Idiopathic acute pancreatitis without necrosis or infection: Secondary | ICD-10-CM

## 2016-01-13 DIAGNOSIS — T464X5A Adverse effect of angiotensin-converting-enzyme inhibitors, initial encounter: Secondary | ICD-10-CM | POA: Diagnosis present

## 2016-01-13 DIAGNOSIS — Z7901 Long term (current) use of anticoagulants: Secondary | ICD-10-CM

## 2016-01-13 DIAGNOSIS — R109 Unspecified abdominal pain: Secondary | ICD-10-CM

## 2016-01-13 DIAGNOSIS — T370X5A Adverse effect of sulfonamides, initial encounter: Secondary | ICD-10-CM | POA: Diagnosis present

## 2016-01-13 DIAGNOSIS — Z6832 Body mass index (BMI) 32.0-32.9, adult: Secondary | ICD-10-CM

## 2016-01-13 HISTORY — DX: Unspecified atrial fibrillation: I48.91

## 2016-01-13 LAB — URINE MICROSCOPIC-ADD ON

## 2016-01-13 LAB — URINALYSIS, ROUTINE W REFLEX MICROSCOPIC
Bilirubin Urine: NEGATIVE
GLUCOSE, UA: NEGATIVE mg/dL
Ketones, ur: NEGATIVE mg/dL
LEUKOCYTES UA: NEGATIVE
Nitrite: NEGATIVE
PH: 6 (ref 5.0–8.0)
Protein, ur: NEGATIVE mg/dL
SPECIFIC GRAVITY, URINE: 1.026 (ref 1.005–1.030)

## 2016-01-13 LAB — COMPREHENSIVE METABOLIC PANEL
ALT: 18 U/L (ref 14–54)
AST: 18 U/L (ref 15–41)
Albumin: 4 g/dL (ref 3.5–5.0)
Alkaline Phosphatase: 92 U/L (ref 38–126)
Anion gap: 8 (ref 5–15)
BUN: 19 mg/dL (ref 6–20)
CHLORIDE: 107 mmol/L (ref 101–111)
CO2: 23 mmol/L (ref 22–32)
CREATININE: 0.79 mg/dL (ref 0.44–1.00)
Calcium: 9.6 mg/dL (ref 8.9–10.3)
GFR calc non Af Amer: >60 mL/min (ref >60)
Glucose, Bld: 104 mg/dL — ABNORMAL HIGH (ref 65–99)
POTASSIUM: 3.9 mmol/L (ref 3.5–5.1)
SODIUM: 138 mmol/L (ref 135–145)
Total Bilirubin: 0.8 mg/dL (ref 0.3–1.2)
Total Protein: 7.1 g/dL (ref 6.5–8.1)

## 2016-01-13 LAB — CBC
HEMATOCRIT: 39.7 % (ref 36.0–46.0)
HEMOGLOBIN: 13.7 g/dL (ref 12.0–15.0)
MCH: 31.7 pg (ref 26.0–34.0)
MCHC: 34.5 g/dL (ref 30.0–36.0)
MCV: 91.9 fL (ref 78.0–100.0)
Platelets: 208 10*3/uL (ref 150–400)
RBC: 4.32 MIL/uL (ref 3.87–5.11)
RDW: 12.9 % (ref 11.5–15.5)
WBC: 10 10*3/uL (ref 4.0–10.5)

## 2016-01-13 LAB — LIPASE, BLOOD: LIPASE: 105 U/L — AB (ref 11–51)

## 2016-01-13 MED ORDER — CARVEDILOL 3.125 MG PO TABS
3.1250 mg | ORAL_TABLET | Freq: Two times a day (BID) | ORAL | Status: DC
  Administered 2016-01-14 – 2016-01-15 (×3): 3.125 mg via ORAL
  Filled 2016-01-13 (×4): qty 1

## 2016-01-13 MED ORDER — SODIUM CHLORIDE 0.9 % IV BOLUS (SEPSIS)
500.0000 mL | Freq: Once | INTRAVENOUS | Status: AC
Start: 2016-01-13 — End: 2016-01-13
  Administered 2016-01-13: 500 mL via INTRAVENOUS

## 2016-01-13 MED ORDER — SODIUM CHLORIDE 0.9 % IV SOLN
Freq: Once | INTRAVENOUS | Status: AC
  Administered 2016-01-13: 23:00:00 via INTRAVENOUS

## 2016-01-13 MED ORDER — HYDROMORPHONE HCL 2 MG/ML IJ SOLN
0.5000 mg | INTRAMUSCULAR | Status: DC | PRN
  Administered 2016-01-14 (×2): 1 mg via INTRAVENOUS
  Filled 2016-01-13 (×2): qty 1

## 2016-01-13 MED ORDER — SODIUM CHLORIDE 0.9 % IV SOLN
INTRAVENOUS | Status: DC
  Administered 2016-01-14 (×3): via INTRAVENOUS

## 2016-01-13 MED ORDER — BUSPIRONE HCL 10 MG PO TABS
10.0000 mg | ORAL_TABLET | Freq: Three times a day (TID) | ORAL | Status: DC | PRN
  Filled 2016-01-13: qty 1

## 2016-01-13 MED ORDER — HYDROMORPHONE HCL 2 MG/ML IJ SOLN
1.0000 mg | Freq: Once | INTRAMUSCULAR | Status: AC
  Administered 2016-01-13: 1 mg via INTRAVENOUS
  Filled 2016-01-13: qty 1

## 2016-01-13 MED ORDER — AMIODARONE HCL 200 MG PO TABS
100.0000 mg | ORAL_TABLET | Freq: Every day | ORAL | Status: DC
  Administered 2016-01-14 – 2016-01-15 (×2): 100 mg via ORAL
  Filled 2016-01-13 (×2): qty 1

## 2016-01-13 MED ORDER — APIXABAN 5 MG PO TABS
5.0000 mg | ORAL_TABLET | Freq: Two times a day (BID) | ORAL | Status: DC
  Administered 2016-01-14 – 2016-01-15 (×4): 5 mg via ORAL
  Filled 2016-01-13 (×4): qty 1

## 2016-01-13 MED ORDER — MORPHINE SULFATE (PF) 4 MG/ML IV SOLN
4.0000 mg | Freq: Once | INTRAVENOUS | Status: AC
  Administered 2016-01-13: 4 mg via INTRAVENOUS
  Filled 2016-01-13: qty 1

## 2016-01-13 MED ORDER — ATORVASTATIN CALCIUM 40 MG PO TABS
40.0000 mg | ORAL_TABLET | Freq: Every day | ORAL | Status: DC
  Administered 2016-01-14: 40 mg via ORAL
  Filled 2016-01-13: qty 1

## 2016-01-13 NOTE — ED Provider Notes (Signed)
CSN: [id number]     Arrival date & time 01/13/16  1829 History   None    Chief Complaint  Patient presents with  . Abdominal Pain   (Consider location/radiation/quality/duration/timing/severity/associated sxs/prior Treatment) 60 y.o. female presents with worsening abdominal pain to her right upper quadrant that radiates to her left upper quadrant and back X.2 weeks  Condition is acute in nature. Condition is made better by nothing. Condition is made worse as the day progresses. Patient denies any relief from OTC constipation  prior to there arrival at this facility.  Patient was recently seen at [facility]D and diagnosed with UTI. Patient denies any relief with antibiotics. Patient denies any nausea, vomiting, diarrhea.       Past Medical History:  Diagnosis Date  . Anxiety   . Arthritis   . Chest pain, central 11/15/2010   Patient admitted to hospital for cardiac rule-out. No MI.    . Depression   . DEPRESSION 04/02/2007   Qualifier: Diagnosis of  By: Henderson Baltimore MD, Janett Billow    . Hyperlipidemia   . Hypertension   . Insomnia   . Obesity    Past Surgical History:  Procedure Laterality Date  . CARPAL TUNNEL RELEASE     bilateral  . CHOLECYSTECTOMY     Family History  Problem Relation Age of Onset  . Atrial fibrillation Mother   . Diabetes Mother   . Heart failure Mother   . COPD Mother   . COPD Father   . Congestive Heart Failure Father   . Sudden Cardiac Death Father    Social History  Substance Use Topics  . Smoking status: Former Smoker    Packs/day: 1.00    Types: Cigarettes    Quit date: 12/09/2015  . Smokeless tobacco: Never Used  . Alcohol use No   OB History    No data available     Review of Systems  Constitutional: Negative.   Gastrointestinal: Positive for abdominal distention and abdominal pain.    Allergies  Review of patient's allergies indicates no known allergies.  Home Medications   Prior to Admission medications   Medication Sig Start Date End  Date Taking? Authorizing Provider  amiodarone (PACERONE) 200 MG tablet Take 1 tablet (200 mg total) by mouth daily. 12/11/15  Yes Reyne Dumas, MD  apixaban (ELIQUIS) 5 MG TABS tablet Take 1 tablet (5 mg total) by mouth 2 (two) times daily. 12/11/15  Yes Reyne Dumas, MD  atorvastatin (LIPITOR) 40 MG tablet Take 1 tablet (40 mg total) by mouth daily at 6 PM. 12/11/15  Yes Reyne Dumas, MD  busPIRone (BUSPAR) 10 MG tablet TAKE ONE TABLET BY MOUTH THREE TIMES DAILY AS NEEDED Patient taking differently: Take 10 mg by mouth three times a day as needed for anxiety 01/02/15  Yes Rosemarie Ax, MD  carvedilol (COREG) 3.125 MG tablet Take 1 tablet (3.125 mg total) by mouth 2 (two) times daily with a meal. 12/11/15  Yes Reyne Dumas, MD  lisinopril (PRINIVIL,ZESTRIL) 2.5 MG tablet Take 1 tablet (2.5 mg total) by mouth daily. 12/11/15  Yes Reyne Dumas, MD  sulfamethoxazole-trimethoprim (BACTRIM DS,SEPTRA DS) 800-160 MG tablet Take 1 tablet by mouth 2 (two) times daily. 01/09/16  Yes Mack Hook, MD  CALCIUM PO Take 1 tablet by mouth daily.    Historical Provider, MD  Cholecalciferol (VITAMIN D-3 PO) Take 1 capsule by mouth every morning.    Historical Provider, MD  nicotine (NICODERM CQ - DOSED IN MG/24 HOURS) 14 mg/24hr patch Place 1  patch (14 mg total) onto the skin daily. 12/11/15   Reyne Dumas, MD  Omega-3 Fatty Acids (FISH OIL) 1000 MG CAPS Take 2 capsules by mouth every morning.     Historical Provider, MD  Potassium 99 MG TABS Take 1 tablet by mouth daily.    Historical Provider, MD   Meds Ordered and Administered this Visit  Medications - No data to display  BP 153/74 (BP Location: Left Arm)   Pulse 62   Temp 99.3 F (37.4 C) (Oral)   Resp 22   SpO2 98%  No data found.   Physical Exam  Constitutional: She is oriented to person, place, and time. She appears well-developed and well-nourished. She appears distressed.  HENT:  Head: Normocephalic and atraumatic.  Eyes: Conjunctivae are  normal.  Neck: Normal range of motion.  Pulmonary/Chest: Effort normal.  Musculoskeletal: Normal range of motion.  Neurological: She is alert and oriented to person, place, and time.  Skin: Skin is warm.  Psychiatric: She has a normal mood and affect.  Nursing note and vitals reviewed.   Urgent Care Course   Clinical Course    Procedures (including critical care time)  Labs Review Labs Reviewed - No data to display  Imaging Review No results found.     MDM   1. Pain of upper abdomen       Jacqualine Mau, NP 01/13/16 [id number]

## 2016-01-13 NOTE — ED Notes (Signed)
Admitting MD at bedside.

## 2016-01-13 NOTE — ED Provider Notes (Signed)
[facility] DEPT Provider Note   CSN: [id number] Arrival date & time: 01/13/16  1922     History   Chief Complaint Chief Complaint  Patient presents with  . Bloated    HPI Sharon Cain is a 60 y.o. female.  Patient with recent UTI treatment presents with flank pain and bloating worsen the right. Patient has tried over-the-counter constipation medicines however the pain has gradually worsened. Intermittent. No current urinary symptoms. No fevers chills or vomiting. Passing small amounts of gas. Patient had a gallbladder removed in the past. Pain radiates the back.      Past Medical History:  Diagnosis Date  . A-fib ([place]) 2017  . Anxiety   . Arthritis   . Chest pain, central 11/15/2010   Patient admitted to hospital for cardiac rule-out. No MI.    . Depression   . DEPRESSION 04/02/2007   Qualifier: Diagnosis of  By: Henderson Baltimore MD, Janett Billow    . Hyperlipidemia   . Hypertension   . Insomnia   . Obesity     Patient Active Problem List   Diagnosis Date Noted  . Acute pancreatitis 01/13/2016  . Hyperglycemia 12/12/2015  . New onset atrial fibrillation ([place]) 12/10/2015  . Hypokalemia 12/10/2015  . Atrial fibrillation ([place]) 12/10/2015  . Back pain with left-sided sciatica 12/24/2012  . OBESITY 07/26/2009  . INSOMNIA-SLEEP DISORDER-UNSPEC 05/18/2007  . OSTEOARTHRITIS 04/02/2007  . HYPERLIPIDEMIA 03/30/2007  . Anxiety state 03/30/2007  . Tobacco abuse 03/30/2007    Past Surgical History:  Procedure Laterality Date  . CARPAL TUNNEL RELEASE     bilateral  . CHOLECYSTECTOMY      OB History    No data available       Home Medications    Prior to Admission medications   Medication Sig Start Date End Date Taking? Authorizing Provider  amiodarone (PACERONE) 200 MG tablet Take 1 tablet (200 mg total) by mouth daily. Patient taking differently: Take 100 mg by mouth daily.  12/11/15  Yes Reyne Dumas, MD  apixaban (ELIQUIS) 5 MG TABS tablet Take 1 tablet (5 mg  total) by mouth 2 (two) times daily. 12/11/15  Yes Reyne Dumas, MD  atorvastatin (LIPITOR) 40 MG tablet Take 1 tablet (40 mg total) by mouth daily at 6 PM. 12/11/15  Yes Reyne Dumas, MD  busPIRone (BUSPAR) 10 MG tablet TAKE ONE TABLET BY MOUTH THREE TIMES DAILY AS NEEDED Patient taking differently: Take 10 mg by mouth three times a day as needed for anxiety 01/02/15  Yes Rosemarie Ax, MD  carvedilol (COREG) 3.125 MG tablet Take 1 tablet (3.125 mg total) by mouth 2 (two) times daily with a meal. 12/11/15  Yes Reyne Dumas, MD  Omega-3 Fatty Acids (FISH OIL) 1000 MG CAPS Take 2 capsules by mouth every morning.    Yes Historical Provider, MD  Potassium 99 MG TABS Take 1 tablet by mouth daily.   Yes Historical Provider, MD  sulfamethoxazole-trimethoprim (BACTRIM DS,SEPTRA DS) 800-160 MG tablet Take 1 tablet by mouth 2 (two) times daily. 01/09/16  Yes Mack Hook, MD    Family History Family History  Problem Relation Age of Onset  . Atrial fibrillation Mother   . Diabetes Mother   . Heart failure Mother   . COPD Mother   . COPD Father   . Congestive Heart Failure Father   . Sudden Cardiac Death Father     Social History Social History  Substance Use Topics  . Smoking status: Former Smoker    Packs/day: 1.00  Types: Cigarettes    Quit date: 12/09/2015  . Smokeless tobacco: Never Used  . Alcohol use No     Allergies   Review of patient's allergies indicates no known allergies.   Review of Systems Review of Systems  Constitutional: Negative for chills and fever.  HENT: Negative for congestion.   Eyes: Negative for visual disturbance.  Respiratory: Negative for shortness of breath.   Cardiovascular: Negative for chest pain.  Gastrointestinal: Positive for abdominal pain and nausea. Negative for vomiting.  Genitourinary: Positive for flank pain. Negative for dysuria.  Musculoskeletal: Negative for back pain, neck pain and neck stiffness.  Skin: Negative for rash.    Neurological: Negative for light-headedness and headaches.     Physical Exam Updated Vital Signs BP 141/70   Pulse (!) 59   Temp 99.1 F (37.3 C) (Oral)   Resp 17   Ht 4' 11.5" (1.511 m)   Wt 165 lb (74.8 kg)   SpO2 100%   BMI 32.77 kg/m   Physical Exam  Constitutional: She is oriented to person, place, and time. She appears well-developed and well-nourished.  HENT:  Head: Normocephalic and atraumatic.  Eyes: Conjunctivae are normal. Right eye exhibits no discharge. Left eye exhibits no discharge.  Neck: Normal range of motion. Neck supple. No tracheal deviation present.  Cardiovascular: Normal rate and regular rhythm.   Pulmonary/Chest: Effort normal and breath sounds normal.  Abdominal: Soft. She exhibits no distension. There is tenderness (mild upper abdomen/flank mild bloating). There is no guarding.  Musculoskeletal: She exhibits no edema.  Neurological: She is alert and oriented to person, place, and time.  Skin: Skin is warm. No rash noted.  Psychiatric: She has a normal mood and affect.  Nursing note and vitals reviewed.    ED Treatments / Results  Labs (all labs ordered are listed, but only abnormal results are displayed) Labs Reviewed  LIPASE, BLOOD - Abnormal; Notable for the following:       Result Value   Lipase 105 (*)    All other components within normal limits  COMPREHENSIVE METABOLIC PANEL - Abnormal; Notable for the following:    Glucose, Bld 104 (*)    All other components within normal limits  URINALYSIS, ROUTINE W REFLEX MICROSCOPIC (NOT AT [facility]) - Abnormal; Notable for the following:    APPearance TURBID (*)    Hgb urine dipstick MODERATE (*)    All other components within normal limits  URINE MICROSCOPIC-ADD ON - Abnormal; Notable for the following:    Squamous Epithelial / LPF 0-5 (*)    Bacteria, UA FEW (*)    Crystals URIC ACID CRYSTALS (*)    All other components within normal limits  CBC    EKG  EKG Interpretation None        Radiology Ct Renal Stone Study  Result Date: 01/13/2016 CLINICAL DATA:  Right flank pain 1-1/2 weeks. EXAM: CT ABDOMEN AND PELVIS WITHOUT CONTRAST TECHNIQUE: Multidetector CT imaging of the abdomen and pelvis was performed following the standard protocol without IV contrast. COMPARISON:  None. FINDINGS: Lower chest: Lung bases are within normal. Hepatobiliary: Previous cholecystectomy. Liver is within normal. Mild prominence of the common bowel duct likely due to post cholecystectomy state. Pancreas: Ill definition of the peripancreatic fat planes with inflammatory change adjacent the head of the pancreas suggesting acute pancreatitis. No evidence of necrosis. Spleen: Within normal. Adrenals/Urinary Tract: Adrenal glands are normal. Kidneys are normal in size without hydronephrosis or nephrolithiasis. Ureters and bladder are normal. Stomach/Bowel: Stomach and  small bowel are normal. Appendix is normal. There is mild diverticulosis of the colon most prominent over the sigmoid colon. Vascular/Lymphatic: Mild calcified plaque over the abdominal aorta. There are a few small sub cm periaortic lymph nodes likely reactive. Reproductive: Within normal. Other: No significant free fluid. Musculoskeletal: Minimal degenerate change the spine and hips as well as symphysis pubis joint. IMPRESSION: Inflammatory change adjacent the head of pancreas likely due to acute pancreatitis. No evidence of necrosis. Colonic diverticulosis. Aortic atherosclerosis. Electronically Signed   By: Marin Olp M.D.   On: 01/13/2016 22:16    Procedures Procedures (including critical care time)  Medications Ordered in ED Medications  amiodarone (PACERONE) tablet 100 mg (not administered)  apixaban (ELIQUIS) tablet 5 mg (not administered)  atorvastatin (LIPITOR) tablet 40 mg (not administered)  carvedilol (COREG) tablet 3.125 mg (not administered)  busPIRone (BUSPAR) tablet 10 mg (not administered)  0.9 %  sodium chloride  infusion (not administered)  HYDROmorphone (DILAUDID) injection 0.5-1 mg (not administered)  morphine 4 MG/ML injection 4 mg (4 mg Intravenous Given 01/13/16 2116)  sodium chloride 0.9 % bolus 500 mL (0 mLs Intravenous Stopped 01/13/16 2300)  HYDROmorphone (DILAUDID) injection 1 mg (1 mg Intravenous Given 01/13/16 2310)  0.9 %  sodium chloride infusion ( Intravenous New Bag/Given 01/13/16 2310)     Initial Impression / Assessment and Plan / ED Course  I have reviewed the triage vital signs and the nursing notes.  Pertinent labs & imaging results that were available during my care of the patient were reviewed by me and considered in my medical decision making (see chart for details).  Clinical Course   Patient presents with worsening bloating and right sided abdominal flank discomfort since Tuesday. Plan for his CT scan to look for signs of kidney stone, Pancreas or bowel issues. Pain meds and blood work.  Lipase mild elevated, CT scan showed inflammation the pancreas. Plan for repeat pain meds, IV fluids. Results and differential diagnosis were discussed with the patient/parent/guardian. Xrays were independently reviewed by myself.  Close follow up outpatient was discussed, comfortable with the plan.   Medications  amiodarone (PACERONE) tablet 100 mg (not administered)  apixaban (ELIQUIS) tablet 5 mg (not administered)  atorvastatin (LIPITOR) tablet 40 mg (not administered)  carvedilol (COREG) tablet 3.125 mg (not administered)  busPIRone (BUSPAR) tablet 10 mg (not administered)  0.9 %  sodium chloride infusion (not administered)  HYDROmorphone (DILAUDID) injection 0.5-1 mg (not administered)  morphine 4 MG/ML injection 4 mg (4 mg Intravenous Given 01/13/16 2116)  sodium chloride 0.9 % bolus 500 mL (0 mLs Intravenous Stopped 01/13/16 2300)  HYDROmorphone (DILAUDID) injection 1 mg (1 mg Intravenous Given 01/13/16 2310)  0.9 %  sodium chloride infusion ( Intravenous New Bag/Given  01/13/16 2310)    Vitals:   01/13/16 2215 01/13/16 2245 01/13/16 2300 01/13/16 2315  BP: 153/64 146/95 144/98 141/70  Pulse: 62 62 (!) 58 (!) 59  Resp: 17 15 19 17   Temp:      TempSrc:      SpO2: 96% 93% 98% 100%  Weight:      Height:        Final diagnoses:  Acute right flank pain  Idiopathic acute pancreatitis, unspecified complication status    Final Clinical Impressions(s) / ED Diagnoses   Final diagnoses:  Acute right flank pain  Idiopathic acute pancreatitis, unspecified complication status    New Prescriptions New Prescriptions   No medications on file     Elnora Morrison, MD 01/13/16 2327

## 2016-01-13 NOTE — ED Triage Notes (Signed)
The patient presented to the [facility] with a complaint of generalized abdominal pain that radiates into her back that started 1 week ago. She reported that she was treated for a UTI and is currently on Bactrim with her last dose to be Monday. The patient also reported that she did take magnesium citrate and did have a bowel movement.

## 2016-01-13 NOTE — ED Triage Notes (Signed)
Pt states 2  Weeks ago she had UTI and started A-Z to help; pt was given antibiotic by PCP but bloating and abdominal pain has increased; Pt states pain goes in to rib cage and back; Pt states she is unable to sleep due to pain; Pt denies any urinary symptoms; pt states she finished antibiotic; Pt states she thought she was constipated and took one bottle of Mag which cleaned her out. Pt had positive bowel sounds in all quadrants at trigae

## 2016-01-14 DIAGNOSIS — K853 Drug induced acute pancreatitis without necrosis or infection: Secondary | ICD-10-CM | POA: Diagnosis not present

## 2016-01-14 LAB — CBC
HCT: 35.8 % — ABNORMAL LOW (ref 36.0–46.0)
Hemoglobin: 11.7 g/dL — ABNORMAL LOW (ref 12.0–15.0)
MCH: 30.3 pg (ref 26.0–34.0)
MCHC: 32.7 g/dL (ref 30.0–36.0)
MCV: 92.7 fL (ref 78.0–100.0)
PLATELETS: 167 10*3/uL (ref 150–400)
RBC: 3.86 MIL/uL — AB (ref 3.87–5.11)
RDW: 13.1 % (ref 11.5–15.5)
WBC: 8.6 10*3/uL (ref 4.0–10.5)

## 2016-01-14 LAB — COMPREHENSIVE METABOLIC PANEL
ALT: 16 U/L (ref 14–54)
AST: 17 U/L (ref 15–41)
Albumin: 3.2 g/dL — ABNORMAL LOW (ref 3.5–5.0)
Alkaline Phosphatase: 73 U/L (ref 38–126)
Anion gap: 7 (ref 5–15)
BILIRUBIN TOTAL: 1.2 mg/dL (ref 0.3–1.2)
BUN: 12 mg/dL (ref 6–20)
CALCIUM: 8.5 mg/dL — AB (ref 8.9–10.3)
CHLORIDE: 105 mmol/L (ref 101–111)
CO2: 23 mmol/L (ref 22–32)
CREATININE: 0.61 mg/dL (ref 0.44–1.00)
Glucose, Bld: 100 mg/dL — ABNORMAL HIGH (ref 65–99)
Potassium: 3.3 mmol/L — ABNORMAL LOW (ref 3.5–5.1)
Sodium: 135 mmol/L (ref 135–145)
TOTAL PROTEIN: 6 g/dL — AB (ref 6.5–8.1)

## 2016-01-14 LAB — LIPID PANEL
CHOLESTEROL: 105 mg/dL (ref 0–200)
HDL: 34 mg/dL — ABNORMAL LOW (ref >40)
LDL Cholesterol: 57 mg/dL (ref 0–99)
TRIGLYCERIDES: 72 mg/dL (ref <150)
Total CHOL/HDL Ratio: 3.1 RATIO
VLDL: 14 mg/dL (ref 0–40)

## 2016-01-14 NOTE — ED Notes (Signed)
Attempted report 

## 2016-01-14 NOTE — Discharge Instructions (Signed)
If you were given medicines take as directed.  If you are on coumadin or contraceptives realize their levels and effectiveness is altered by many different medicines.  If you have any reaction (rash, tongues swelling, other) to the medicines stop taking and see a physician.    If your blood pressure was elevated in the ER make sure you follow up for management with a primary doctor or return for chest pain, shortness of breath or stroke symptoms.  Please follow up as directed and return to the ER or see a physician for new or worsening symptoms.  Thank you. Vitals:   01/13/16 1931 01/13/16 1933 01/13/16 1934  BP: 146/82    Pulse: 61    Resp: 18    Temp: 99.1 F (37.3 C)    TempSrc: Oral    SpO2: 96%  100%  Weight:  165 lb (74.8 kg)   Height:  4' 11.5" (1.511 m)     Information on my medicine - ELIQUIS (apixaban)  Why was Eliquis prescribed for you? Eliquis was prescribed for you to reduce the risk of a blood clot forming that can cause a stroke if you have a medical condition called atrial fibrillation (a type of irregular heartbeat).  What do You need to know about Eliquis ? Take your Eliquis TWICE DAILY - one tablet in the morning and one tablet in the evening with or without food. If you have difficulty swallowing the tablet whole please discuss with your pharmacist how to take the medication safely.  Take Eliquis exactly as prescribed by your doctor and DO NOT stop taking Eliquis without talking to the doctor who prescribed the medication.  Stopping may increase your risk of developing a stroke.  Refill your prescription before you run out.  After discharge, you should have regular check-up appointments with your healthcare provider that is prescribing your Eliquis.  In the future your dose may need to be changed if your kidney function or weight changes by a significant amount or as you get older.  What do you do if you miss a dose? If you miss a dose, take it as soon as  you remember on the same day and resume taking twice daily.  Do not take more than one dose of ELIQUIS at the same time to make up a missed dose.  Important Safety Information A possible side effect of Eliquis is bleeding. You should call your healthcare provider right away if you experience any of the following: ? Bleeding from an injury or your nose that does not stop. ? Unusual colored urine (red or dark brown) or unusual colored stools (red or black). ? Unusual bruising for unknown reasons. ? A serious fall or if you hit your head (even if there is no bleeding).  Some medicines may interact with Eliquis and might increase your risk of bleeding or clotting while on Eliquis. To help avoid this, consult your healthcare provider or pharmacist prior to using any new prescription or non-prescription medications, including herbals, vitamins, non-steroidal anti-inflammatory drugs (NSAIDs) and supplements.  This website has more information on Eliquis (apixaban): http://www.eliquis.com/eliquis/home

## 2016-01-14 NOTE — Progress Notes (Signed)
Patient seen and examined this morning, admitted overnight by Dr. Alcario Drought, for full details please refer to H&P. In brief, this is a pleasant 60 year old female with history of hypertension, who was recently placed on Bactrim for urinary tract infection who came in with progressively worsening epigastric abdominal pain over the past few days, she underwent a CT scan in the emergency room which shows pancreatitis and she was admitted to medicine service.    Acute pancreatitis - Mild, lipase elevated to 105 on admission, she is tolerating a clear liquid diet, maintain for now, continue IV fluids, pain medications and antinausea medications - This is likely due to Bactrim or lisinopril, we'll discontinue both - Lipid panel unremarkable - She is status post cholecystectomy, no liver abnormalities noted on the CT scan -CT scan with a fluttery changes adjacent to the pancreas head  Chronic systolic CHF  - Most recent 2-D echo done in September 2017, showed an ejection fraction of 40-45%. She currently is euvolemic, continue IV fluids in the setting of acute pancreatitis, closely monitor fluid overload  - Hold her lisinopril as above, continue Coreg - No chest pain, no palpitations  Hyperlipidemia - Continue her atorvastatin   Atrial fibrillation, paroxysmal - patient's CHA2DS2-VASc Score for Stroke Risk is at least 2, continue anticoagulation with Eliquis - Continue Coreg, continue amiodarone    Costin M. Cruzita Lederer, MD [facility] [phone]

## 2016-01-14 NOTE — H&P (Signed)
History and Physical    Sharon Cain [id number] DOB: [birth year] DOA: 01/13/2016   PCP: Mack Hook, MD Chief Complaint:  Chief Complaint  Patient presents with  . Bloated    HPI: Sharon Cain is a 60 y.o. female with medical history significant of HTN.  Patient was just put on bactrim for UTI last week.  UTI symptoms improved, but patient has developed severe epigastric pain over the past few days.  This has been so severe she hasnt been able to sleep at night.  OTC meds havent helped.  She though she was constipated and took a bottle of mag citrate.  ED Course: Has mild acute pancreatitis on CT scan and lipase of 100.  Review of Systems: As per HPI otherwise 10 point review of systems negative.    Past Medical History:  Diagnosis Date  . A-fib ([place]) 2017  . Anxiety   . Arthritis   . Chest pain, central 11/15/2010   Patient admitted to hospital for cardiac rule-out. No MI.    . Depression   . DEPRESSION 04/02/2007   Qualifier: Diagnosis of  By: Henderson Baltimore MD, Janett Billow    . Hyperlipidemia   . Hypertension   . Insomnia   . Obesity     Past Surgical History:  Procedure Laterality Date  . CARPAL TUNNEL RELEASE     bilateral  . CHOLECYSTECTOMY       reports that she quit smoking about 5 weeks ago. Her smoking use included Cigarettes. She smoked 1.00 pack per day. She has never used smokeless tobacco. She reports that she does not drink alcohol or use drugs.  No Known Allergies  Family History  Problem Relation Age of Onset  . Atrial fibrillation Mother   . Diabetes Mother   . Heart failure Mother   . COPD Mother   . COPD Father   . Congestive Heart Failure Father   . Sudden Cardiac Death Father       Prior to Admission medications   Medication Sig Start Date End Date Taking? Authorizing Provider  amiodarone (PACERONE) 200 MG tablet Take 1 tablet (200 mg total) by mouth daily. Patient taking differently: Take 100 mg by mouth daily.  12/11/15  Yes  Reyne Dumas, MD  apixaban (ELIQUIS) 5 MG TABS tablet Take 1 tablet (5 mg total) by mouth 2 (two) times daily. 12/11/15  Yes Reyne Dumas, MD  atorvastatin (LIPITOR) 40 MG tablet Take 1 tablet (40 mg total) by mouth daily at 6 PM. 12/11/15  Yes Reyne Dumas, MD  busPIRone (BUSPAR) 10 MG tablet TAKE ONE TABLET BY MOUTH THREE TIMES DAILY AS NEEDED Patient taking differently: Take 10 mg by mouth three times a day as needed for anxiety 01/02/15  Yes Rosemarie Ax, MD  carvedilol (COREG) 3.125 MG tablet Take 1 tablet (3.125 mg total) by mouth 2 (two) times daily with a meal. 12/11/15  Yes Reyne Dumas, MD  Omega-3 Fatty Acids (FISH OIL) 1000 MG CAPS Take 2 capsules by mouth every morning.    Yes Historical Provider, MD  Potassium 99 MG TABS Take 1 tablet by mouth daily.   Yes Historical Provider, MD  sulfamethoxazole-trimethoprim (BACTRIM DS,SEPTRA DS) 800-160 MG tablet Take 1 tablet by mouth 2 (two) times daily. 01/09/16  Yes Mack Hook, MD    Physical Exam: Vitals:   01/13/16 2215 01/13/16 2245 01/13/16 2300 01/13/16 2315  BP: 153/64 146/95 144/98 141/70  Pulse: 62 62 (!) 58 (!) 59  Resp: 17 15 19  17  Temp:      TempSrc:      SpO2: 96% 93% 98% 100%  Weight:      Height:          Constitutional: NAD, calm, comfortable Eyes: PERRL, lids and conjunctivae normal ENMT: Mucous membranes are moist. Posterior pharynx clear of any exudate or lesions.Normal dentition.  Neck: normal, supple, no masses, no thyromegaly Respiratory: clear to auscultation bilaterally, no wheezing, no crackles. Normal respiratory effort. No accessory muscle use.  Cardiovascular: Regular rate and rhythm, no murmurs / rubs / gallops. No extremity edema. 2+ pedal pulses. No carotid bruits.  Abdomen: mild epigastric tenderness, no masses palpated. No hepatosplenomegaly. Bowel sounds positive.  Musculoskeletal: no clubbing / cyanosis. No joint deformity upper and lower extremities. Good ROM, no contractures. Normal  muscle tone.  Skin: no rashes, lesions, ulcers. No induration Neurologic: CN 2-12 grossly intact. Sensation intact, DTR normal. Strength 5/5 in all 4.  Psychiatric: Normal judgment and insight. Alert and oriented x 3. Normal mood.    Labs on Admission: I have personally reviewed following labs and imaging studies  CBC:  Recent Labs Lab 01/13/16 1946  WBC 10.0  HGB 13.7  HCT 39.7  MCV 91.9  PLT 123XX123   Basic Metabolic Panel:  Recent Labs Lab 01/13/16 1946  NA 138  K 3.9  CL 107  CO2 23  GLUCOSE 104*  BUN 19  CREATININE 0.79  CALCIUM 9.6   GFR: Estimated Creatinine Clearance: 66 mL/min (by C-G formula based on SCr of 0.79 mg/dL). Liver Function Tests:  Recent Labs Lab 01/13/16 1946  AST 18  ALT 18  ALKPHOS 92  BILITOT 0.8  PROT 7.1  ALBUMIN 4.0    Recent Labs Lab 01/13/16 1946  LIPASE 105*   No results for input(s): AMMONIA in the last 168 hours. Coagulation Profile: No results for input(s): INR, PROTIME in the last 168 hours. Cardiac Enzymes: No results for input(s): CKTOTAL, CKMB, CKMBINDEX, TROPONINI in the last 168 hours. BNP (last 3 results) No results for input(s): PROBNP in the last 8760 hours. HbA1C: No results for input(s): HGBA1C in the last 72 hours. CBG: No results for input(s): GLUCAP in the last 168 hours. Lipid Profile: No results for input(s): CHOL, HDL, LDLCALC, TRIG, CHOLHDL, LDLDIRECT in the last 72 hours. Thyroid Function Tests: No results for input(s): TSH, T4TOTAL, FREET4, T3FREE, THYROIDAB in the last 72 hours. Anemia Panel: No results for input(s): VITAMINB12, FOLATE, FERRITIN, TIBC, IRON, RETICCTPCT in the last 72 hours. Urine analysis:    Component Value Date/Time   COLORURINE YELLOW 01/13/2016 1939   APPEARANCEUR TURBID (A) 01/13/2016 1939   LABSPEC 1.026 01/13/2016 1939   PHURINE 6.0 01/13/2016 1939   GLUCOSEU NEGATIVE 01/13/2016 1939   HGBUR MODERATE (A) 01/13/2016 1939   BILIRUBINUR NEGATIVE 01/13/2016 1939    BILIRUBINUR Negative 01/09/2016 1259   KETONESUR NEGATIVE 01/13/2016 1939   PROTEINUR NEGATIVE 01/13/2016 1939   UROBILINOGEN 0.2 01/09/2016 1259   NITRITE NEGATIVE 01/13/2016 1939   LEUKOCYTESUR NEGATIVE 01/13/2016 1939   Sepsis Labs: @LABRCNTIP (procalcitonin:4,lacticidven:4) ) Recent Results (from the past 240 hour(s))  Urine Culture     Status: Abnormal   Collection Time: 01/09/16 12:31 PM  Result Value Ref Range Status   Urine Culture, Routine Final report (A)  Final   Urine Culture result 1 Comment (A)  Final    Comment: Staphylococcus saprophyticus Greater than 100,000 colony forming units per mL The CLSI does not advise routine susceptibility testing of urinary tract isolates of Staphylococcus saprophyticus,  because acute, uncomplicated urinary tract infections caused by this organism respond to concentrations achieved in urine of antimicrobial agents commonly used to treat these infections, such as nitrofurantoin, a fluoroquinolone, or trimethoprim with or without sulfamethoxazole. CLSI, M100-S15, 2005.      Radiological Exams on Admission: Ct Renal Stone Study  Result Date: 01/13/2016 CLINICAL DATA:  Right flank pain 1-1/2 weeks. EXAM: CT ABDOMEN AND PELVIS WITHOUT CONTRAST TECHNIQUE: Multidetector CT imaging of the abdomen and pelvis was performed following the standard protocol without IV contrast. COMPARISON:  None. FINDINGS: Lower chest: Lung bases are within normal. Hepatobiliary: Previous cholecystectomy. Liver is within normal. Mild prominence of the common bowel duct likely due to post cholecystectomy state. Pancreas: Ill definition of the peripancreatic fat planes with inflammatory change adjacent the head of the pancreas suggesting acute pancreatitis. No evidence of necrosis. Spleen: Within normal. Adrenals/Urinary Tract: Adrenal glands are normal. Kidneys are normal in size without hydronephrosis or nephrolithiasis. Ureters and bladder are normal. Stomach/Bowel:  Stomach and small bowel are normal. Appendix is normal. There is mild diverticulosis of the colon most prominent over the sigmoid colon. Vascular/Lymphatic: Mild calcified plaque over the abdominal aorta. There are a few small sub cm periaortic lymph nodes likely reactive. Reproductive: Within normal. Other: No significant free fluid. Musculoskeletal: Minimal degenerate change the spine and hips as well as symphysis pubis joint. IMPRESSION: Inflammatory change adjacent the head of pancreas likely due to acute pancreatitis. No evidence of necrosis. Colonic diverticulosis. Aortic atherosclerosis. Electronically Signed   By: Marin Olp M.D.   On: 01/13/2016 22:16    EKG: Independently reviewed.  Assessment/Plan Active Problems:   Acute pancreatitis    1. Acute pancreatitis -  1. Believed to be medication induced 2. Stopping bactrim (as UTI appears cleared anyhow), and stopping lisinopril 3. IVF 4. Clear liquid diet 5. Pain control with dilaudid PRN   DVT prophylaxis: Lovenox Code Status: Full Family Communication: Husband at bedside Consults called: None Admission status: Place in [place], [facility] Pager [phone] from 7PM-7AM  If 7AM-7PM, please contact the day physician for the patient [email or web address] Password [email or web address]  01/14/2016, 12:12 AM

## 2016-01-15 ENCOUNTER — Telehealth: Payer: Self-pay | Admitting: Internal Medicine

## 2016-01-15 DIAGNOSIS — I5022 Chronic systolic (congestive) heart failure: Secondary | ICD-10-CM | POA: Diagnosis not present

## 2016-01-15 DIAGNOSIS — E782 Mixed hyperlipidemia: Secondary | ICD-10-CM | POA: Diagnosis not present

## 2016-01-15 DIAGNOSIS — K853 Drug induced acute pancreatitis without necrosis or infection: Secondary | ICD-10-CM | POA: Diagnosis not present

## 2016-01-15 DIAGNOSIS — I4891 Unspecified atrial fibrillation: Secondary | ICD-10-CM

## 2016-01-15 LAB — COMPREHENSIVE METABOLIC PANEL
ALBUMIN: 2.9 g/dL — AB (ref 3.5–5.0)
ALK PHOS: 75 U/L (ref 38–126)
ALT: 17 U/L (ref 14–54)
ANION GAP: 6 (ref 5–15)
AST: 16 U/L (ref 15–41)
BUN: <5 mg/dL — ABNORMAL LOW (ref 6–20)
CALCIUM: 8.6 mg/dL — AB (ref 8.9–10.3)
CHLORIDE: 107 mmol/L (ref 101–111)
CO2: 24 mmol/L (ref 22–32)
Creatinine, Ser: 0.49 mg/dL (ref 0.44–1.00)
GFR calc Af Amer: >60 mL/min (ref >60)
GFR calc non Af Amer: >60 mL/min (ref >60)
GLUCOSE: 104 mg/dL — AB (ref 65–99)
POTASSIUM: 3.6 mmol/L (ref 3.5–5.1)
SODIUM: 137 mmol/L (ref 135–145)
Total Bilirubin: 1.4 mg/dL — ABNORMAL HIGH (ref 0.3–1.2)
Total Protein: 5.5 g/dL — ABNORMAL LOW (ref 6.5–8.1)

## 2016-01-15 LAB — CBC
HEMATOCRIT: 33.3 % — AB (ref 36.0–46.0)
HEMOGLOBIN: 11 g/dL — AB (ref 12.0–15.0)
MCH: 30.6 pg (ref 26.0–34.0)
MCHC: 33 g/dL (ref 30.0–36.0)
MCV: 92.5 fL (ref 78.0–100.0)
Platelets: 156 10*3/uL (ref 150–400)
RBC: 3.6 MIL/uL — ABNORMAL LOW (ref 3.87–5.11)
RDW: 12.7 % (ref 11.5–15.5)
WBC: 7.4 10*3/uL (ref 4.0–10.5)

## 2016-01-15 LAB — LIPASE, BLOOD: Lipase: 80 U/L — ABNORMAL HIGH (ref 11–51)

## 2016-01-15 NOTE — Telephone Encounter (Signed)
Patient was released from hospital and needs a follow-up appointment for pancreatitis.  Schedule only has acute appointments for next two days out.  Please advise as to where Dr. Amil Amen would want patient scheduled.  Patient can be reached at [phone].

## 2016-01-15 NOTE — Progress Notes (Signed)
IV removed. AVS given to patient. Understanding verbalized.  Transportation arranged by patient. Belongings packed. No NV to report after lunch.

## 2016-01-15 NOTE — Discharge Summary (Signed)
Physician Discharge Summary  Sharon Cain [id number] DOB: 01/16/1956 DOA: 01/13/2016  PCP: Mack Hook, MD  Admit date: 01/13/2016 Discharge date: 01/15/2016  Admitted From: home Disposition:  home  Recommendations for Outpatient Follow-up:  1. Follow up with PCP in 1-2 weeks  Home Health: none Equipment/Devices: none  Discharge Condition: stable CODE STATUS: Full Diet recommendation: low fat  HPI: Per Dr. Alcario Drought,  Sharon Cain is a 60 y.o. female with medical history significant of HTN.  Patient was just put on bactrim for UTI last week.  UTI symptoms improved, but patient has developed severe epigastric pain over the past few days.  This has been so severe she hasnt been able to sleep at night.  OTC meds havent helped.  She though she was constipated and took a bottle of mag citrate.  Hospital Course: Discharge Diagnoses:  Active Problems:   HYPERLIPIDEMIA   Atrial fibrillation (HCC)   Acute pancreatitis   Chronic systolic CHF (congestive heart failure) (HCC)   Acute pancreatitis - Mild, lipase elevated to 105 on admission, she was initially on liquid diet, IVF, pain control and antiemetics. She improved rapidly, her diet was advanced and is able to tolerate a regular diet without further nausea/vomiting/abdominal pain. She was discharged home in stable condition. Her pancreatitis was thought to be related to Bactrim which she was recently on due to UTI vs Lisinopril. These medications were stopped. She was advised to follow up with Dr. Terrence Dupont and if stable to be evaluated for ARB. Her lipid panel was unremarkable, she is status post cholecystectomy, no liver abnormalities noted on the CT scan Chronic systolic CHF - Most recent 2-D echo done in September 2017, showed an ejection fraction of 40-45%. She currently is euvolemic, was dehydrated and tolerated fluids well. Continue Coreg Hyperlipidemia - Continue her atorvastatin  Atrial fibrillation,  paroxysmal - patient's CHA2DS2-VASc Score for Stroke Risk is at least 2, continue anticoagulation with Eliquis, continue Coreg, continue amiodarone    Discharge Instructions     Medication List    STOP taking these medications   sulfamethoxazole-trimethoprim 800-160 MG tablet Commonly known as:  BACTRIM DS,SEPTRA DS     TAKE these medications   amiodarone 200 MG tablet Commonly known as:  PACERONE Take 1 tablet (200 mg total) by mouth daily. What changed:  how much to take   apixaban 5 MG Tabs tablet Commonly known as:  ELIQUIS Take 1 tablet (5 mg total) by mouth 2 (two) times daily.   atorvastatin 40 MG tablet Commonly known as:  LIPITOR Take 1 tablet (40 mg total) by mouth daily at 6 PM.   busPIRone 10 MG tablet Commonly known as:  BUSPAR TAKE ONE TABLET BY MOUTH THREE TIMES DAILY AS NEEDED What changed:  See the new instructions.   carvedilol 3.125 MG tablet Commonly known as:  COREG Take 1 tablet (3.125 mg total) by mouth 2 (two) times daily with a meal.   Fish Oil 1000 MG Caps Take 2 capsules by mouth every morning.   Potassium 99 MG Tabs Take 1 tablet by mouth daily.      Follow-up Information    MULBERRY,ELIZABETH, MD. Call in 2 day(s).   Specialty:  Internal Medicine Contact information: [place] [address] [phone]        Charolette Forward, MD. Schedule an appointment as soon as possible for a visit in 2 week(s).   Specialty:  Cardiology Contact information: [place] [address] [place] [address] [phone]  No Known Allergies  Consultations:  None   Procedures/Studies:  Ct Renal Stone Study  Result Date: 01/13/2016 CLINICAL DATA:  Right flank pain 1-1/2 weeks. EXAM: CT ABDOMEN AND PELVIS WITHOUT CONTRAST TECHNIQUE: Multidetector CT imaging of the abdomen and pelvis was performed following the standard protocol without IV contrast. COMPARISON:  None. FINDINGS: Lower chest: Lung bases are  within normal. Hepatobiliary: Previous cholecystectomy. Liver is within normal. Mild prominence of the common bowel duct likely due to post cholecystectomy state. Pancreas: Ill definition of the peripancreatic fat planes with inflammatory change adjacent the head of the pancreas suggesting acute pancreatitis. No evidence of necrosis. Spleen: Within normal. Adrenals/Urinary Tract: Adrenal glands are normal. Kidneys are normal in size without hydronephrosis or nephrolithiasis. Ureters and bladder are normal. Stomach/Bowel: Stomach and small bowel are normal. Appendix is normal. There is mild diverticulosis of the colon most prominent over the sigmoid colon. Vascular/Lymphatic: Mild calcified plaque over the abdominal aorta. There are a few small sub cm periaortic lymph nodes likely reactive. Reproductive: Within normal. Other: No significant free fluid. Musculoskeletal: Minimal degenerate change the spine and hips as well as symphysis pubis joint. IMPRESSION: Inflammatory change adjacent the head of pancreas likely due to acute pancreatitis. No evidence of necrosis. Colonic diverticulosis. Aortic atherosclerosis. Electronically Signed   By: Marin Olp M.D.   On: 01/13/2016 22:16      Subjective: - no chest pain, shortness of breath, no abdominal pain, nausea or vomiting. Eating well   Discharge Exam: Vitals:   01/14/16 2151 01/15/16 0454  BP: 120/60 128/65  Pulse: (!) 54 79  Resp: 17 18  Temp: 99.1 F (37.3 C) 99.2 F (37.3 C)   Vitals:   01/14/16 0606 01/14/16 1401 01/14/16 2151 01/15/16 0454  BP: 120/69 (!) 147/56 120/60 128/65  Pulse: (!) 58 (!) 57 (!) 54 79  Resp: 17  17 18   Temp: 98.8 F (37.1 C) 99.1 F (37.3 C) 99.1 F (37.3 C) 99.2 F (37.3 C)  TempSrc: Oral Oral Oral Oral  SpO2: 97% 97% 96% 97%  Weight:      Height:        General: Pt is alert, awake, not in acute distress Cardiovascular: RRR, S1/S2 +, no rubs, no gallops Respiratory: CTA bilaterally, no wheezing, no  rhonchi Abdominal: Soft, NT, ND, bowel sounds + Extremities: no edema, no cyanosis    The results of significant diagnostics from this hospitalization (including imaging, microbiology, ancillary and laboratory) are listed below for reference.     Microbiology: Recent Results (from the past 240 hour(s))  Urine Culture     Status: Abnormal   Collection Time: 01/09/16 12:31 PM  Result Value Ref Range Status   Urine Culture, Routine Final report (A)  Final   Urine Culture result 1 Comment (A)  Final    Comment: Staphylococcus saprophyticus Greater than 100,000 colony forming units per mL The CLSI does not advise routine susceptibility testing of urinary tract isolates of Staphylococcus saprophyticus, because acute, uncomplicated urinary tract infections caused by this organism respond to concentrations achieved in urine of antimicrobial agents commonly used to treat these infections, such as nitrofurantoin, a fluoroquinolone, or trimethoprim with or without sulfamethoxazole. CLSI, M100-S15, 2005.      Labs: BNP (last 3 results) No results for input(s): BNP in the last 8760 hours. Basic Metabolic Panel:  Recent Labs Lab 01/13/16 1946 01/14/16 0328 01/15/16 0355  NA 138 135 137  K 3.9 3.3* 3.6  CL 107 105 107  CO2 23 23 24  GLUCOSE 104* 100* 104*  BUN 19 12 <5*  CREATININE 0.79 0.61 0.49  CALCIUM 9.6 8.5* 8.6*   Liver Function Tests:  Recent Labs Lab 01/13/16 1946 01/14/16 0328 01/15/16 0355  AST 18 17 16   ALT 18 16 17   ALKPHOS 92 73 75  BILITOT 0.8 1.2 1.4*  PROT 7.1 6.0* 5.5*  ALBUMIN 4.0 3.2* 2.9*    Recent Labs Lab 01/13/16 1946 01/15/16 0355  LIPASE 105* 80*   No results for input(s): AMMONIA in the last 168 hours. CBC:  Recent Labs Lab 01/13/16 1946 01/14/16 0328 01/15/16 0355  WBC 10.0 8.6 7.4  HGB 13.7 11.7* 11.0*  HCT 39.7 35.8* 33.3*  MCV 91.9 92.7 92.5  PLT 208 167 156   Cardiac Enzymes: No results for input(s): CKTOTAL, CKMB,  CKMBINDEX, TROPONINI in the last 168 hours. BNP: Invalid input(s): POCBNP CBG: No results for input(s): GLUCAP in the last 168 hours. D-Dimer No results for input(s): DDIMER in the last 72 hours. Hgb A1c No results for input(s): HGBA1C in the last 72 hours. Lipid Profile  Recent Labs  01/14/16 0908  CHOL 105  HDL 34*  LDLCALC 57  TRIG 72  CHOLHDL 3.1   Thyroid function studies No results for input(s): TSH, T4TOTAL, T3FREE, THYROIDAB in the last 72 hours.  Invalid input(s): FREET3 Anemia work up No results for input(s): VITAMINB12, FOLATE, FERRITIN, TIBC, IRON, RETICCTPCT in the last 72 hours. Urinalysis    Component Value Date/Time   COLORURINE YELLOW 01/13/2016 1939   APPEARANCEUR TURBID (A) 01/13/2016 1939   LABSPEC 1.026 01/13/2016 1939   PHURINE 6.0 01/13/2016 1939   GLUCOSEU NEGATIVE 01/13/2016 1939   HGBUR MODERATE (A) 01/13/2016 1939   BILIRUBINUR NEGATIVE 01/13/2016 1939   BILIRUBINUR Negative 01/09/2016 1259   KETONESUR NEGATIVE 01/13/2016 1939   PROTEINUR NEGATIVE 01/13/2016 1939   UROBILINOGEN 0.2 01/09/2016 1259   NITRITE NEGATIVE 01/13/2016 1939   LEUKOCYTESUR NEGATIVE 01/13/2016 1939   Sepsis Labs Invalid input(s): PROCALCITONIN,  WBC,  LACTICIDVEN Microbiology Recent Results (from the past 240 hour(s))  Urine Culture     Status: Abnormal   Collection Time: 01/09/16 12:31 PM  Result Value Ref Range Status   Urine Culture, Routine Final report (A)  Final   Urine Culture result 1 Comment (A)  Final    Comment: Staphylococcus saprophyticus Greater than 100,000 colony forming units per mL The CLSI does not advise routine susceptibility testing of urinary tract isolates of Staphylococcus saprophyticus, because acute, uncomplicated urinary tract infections caused by this organism respond to concentrations achieved in urine of antimicrobial agents commonly used to treat these infections, such as nitrofurantoin, a fluoroquinolone, or trimethoprim with  or without sulfamethoxazole. CLSI, M100-S15, 2005.      Time coordinating discharge: Over 30 minutes  SIGNED:  Marzetta Board, MD  [facility] 01/15/2016, 3:01 PM Pager [phone]  If 7PM-7AM, please contact night-coverage [email or web address] Password [email or web address]

## 2016-01-16 NOTE — Telephone Encounter (Signed)
Dr. Amil Amen reviewed patients hospital admission we will call and schedule hospital follow up

## 2016-01-23 ENCOUNTER — Ambulatory Visit: Payer: Self-pay | Admitting: Internal Medicine

## 2016-01-31 ENCOUNTER — Ambulatory Visit (INDEPENDENT_AMBULATORY_CARE_PROVIDER_SITE_OTHER): Payer: Self-pay | Admitting: Internal Medicine

## 2016-01-31 VITALS — BP 120/76 | HR 60 | Ht 59.25 in | Wt 172.0 lb

## 2016-01-31 DIAGNOSIS — Z720 Tobacco use: Secondary | ICD-10-CM

## 2016-01-31 DIAGNOSIS — I5022 Chronic systolic (congestive) heart failure: Secondary | ICD-10-CM

## 2016-01-31 DIAGNOSIS — K853 Drug induced acute pancreatitis without necrosis or infection: Secondary | ICD-10-CM

## 2016-01-31 DIAGNOSIS — Z79899 Other long term (current) drug therapy: Secondary | ICD-10-CM

## 2016-01-31 MED ORDER — SACUBITRIL-VALSARTAN 24-26 MG PO TABS
1.0000 | ORAL_TABLET | Freq: Two times a day (BID) | ORAL | 11 refills | Status: DC
Start: 2016-01-31 — End: 2016-05-13

## 2016-01-31 MED ORDER — ATORVASTATIN CALCIUM 40 MG PO TABS: 40.0000 mg | ORAL_TABLET | Freq: Every day | ORAL | 11 refills | Status: DC

## 2016-01-31 MED ORDER — CETIRIZINE HCL 10 MG PO TABS: 10.0000 mg | ORAL_TABLET | Freq: Every day | ORAL | 11 refills | Status: DC

## 2016-01-31 NOTE — Progress Notes (Signed)
   Subjective:    Patient ID: Sharon Cain, female    DOB: 06-25-1955, 60 y.o.   MRN: [id number]  HPI   1.  Episode of pancreatitis 01/13/2016 felt likely to combination of Bactrim and Lisinopril  Both medications discontinued in hospital.  She saw Dr Terrence Dupont, cardiology, subsequent to hospitalization and he started her on Entresto 24/26 mg twice daily.  She is tolerating fine.  She does have coupons to be able to afford.  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 She states her pain from pancreatitis resolved while in hospital Her UTI had resolved by the time of her hospitalization. Would like to see about signing up with [facility] for Entresto.  2. Allergy symptoms:  Ears plugged, cough, posterior pharyngeal, sneezing fits.  Some itching of eyes.  No fever.  Has allergy symptoms spring and fall.  Has taken Cetirizine with good result previously  3.  Tobacco Abuse:  No smoking since hospitalized  4.  Dilated cardiomyopathy and atrial fib--feels palpitations if a bit late with Amiodarone dosing, so is staying on a strict dosing schedule.  Has not been able to exercise as Dr Terrence Dupont has asked her to hold on that.  Eating more since stopped smoking.  Weight up 6 lbs since September.   Current Meds  Medication Sig  . amiodarone (PACERONE) 200 MG tablet Take 1 tablet (200 mg total) by mouth daily. (Patient taking differently: Take 100 mg by mouth daily. )  . apixaban (ELIQUIS) 5 MG TABS tablet Take 1 tablet (5 mg total) by mouth 2 (two) times daily.  Marland Kitchen atorvastatin (LIPITOR) 40 MG tablet Take 1 tablet (40 mg total) by mouth daily at 6 PM.  . busPIRone (BUSPAR) 10 MG tablet TAKE ONE TABLET BY MOUTH THREE TIMES DAILY AS NEEDED (Patient taking differently: Take 10 mg by mouth three times a day as needed for anxiety)  . carvedilol (COREG) 3.125 MG tablet Take 1 tablet (3.125 mg total) by mouth 2 (two) times daily with a meal.  . Omega-3 Fatty Acids (FISH OIL) 1000 MG CAPS Take 2 capsules by mouth  every morning.   . Potassium 99 MG TABS Take 1 tablet by mouth daily.  . sacubitril-valsartan (ENTRESTO) 24-26 MG Take 1 tablet by mouth 2 (two) times daily.   Allergies  Allergen Reactions  . Sulfa Antibiotics     Pancreatitis    Review of Systems     Objective:   Physical Exam NAD Lungs:  CTA CV:  RRR with normal S1 and S2, No S3, S4 or murmur.  Radial and DP pulses normal and equal\\ Abd:  S, NT, No HSM or mass,+BS LE:  No edema       Assessment & Plan:  1.  Acute Pancreatitis felt secondary to Sulfa and ACE combination.  2.  Allergies:  Cetirizine 10 mg daily as needed for allergies.  To call if does not help.  May be able to stop after hard freeze  3.  CHF:  Appears stable on current drug regimen.  Dr Terrence Dupont, cardiology,  asked for a number of labs:  TSH, CBC, CMP.  Patient not fasting, so unable to obtain FLP.  4.  Tobacco Abuse:  No smoking since hospitalization for pancreatitis.  To call if she needs support.

## 2016-02-01 LAB — TSH: TSH: 1.57 u[IU]/mL (ref 0.450–4.500)

## 2016-02-01 LAB — CBC WITH DIFFERENTIAL/PLATELET
BASOS: 0 %
Basophils Absolute: 0 10*3/uL (ref 0.0–0.2)
EOS (ABSOLUTE): 0.2 10*3/uL (ref 0.0–0.4)
EOS: 4 %
HEMATOCRIT: 42.6 % (ref 34.0–46.6)
Hemoglobin: 14.1 g/dL (ref 11.1–15.9)
IMMATURE GRANULOCYTES: 0 %
Immature Grans (Abs): 0 10*3/uL (ref 0.0–0.1)
Lymphocytes Absolute: 1.3 10*3/uL (ref 0.7–3.1)
Lymphs: 28 %
MCH: 31.3 pg (ref 26.6–33.0)
MCHC: 33.1 g/dL (ref 31.5–35.7)
MCV: 95 fL (ref 79–97)
MONOS ABS: 0.4 10*3/uL (ref 0.1–0.9)
Monocytes: 9 %
NEUTROS ABS: 2.6 10*3/uL (ref 1.4–7.0)
NEUTROS PCT: 59 %
Platelets: 199 10*3/uL (ref 150–379)
RBC: 4.5 x10E6/uL (ref 3.77–5.28)
RDW: 13.6 % (ref 12.3–15.4)
WBC: 4.5 10*3/uL (ref 3.4–10.8)

## 2016-02-01 LAB — COMPREHENSIVE METABOLIC PANEL
A/G RATIO: 1.9 (ref 1.2–2.2)
ALT: 13 IU/L (ref 0–32)
AST: 13 IU/L (ref 0–40)
Albumin: 4.4 g/dL (ref 3.6–4.8)
Alkaline Phosphatase: 82 IU/L (ref 39–117)
BUN/Creatinine Ratio: 17 (ref 12–28)
BUN: 10 mg/dL (ref 8–27)
Bilirubin Total: 0.9 mg/dL (ref 0.0–1.2)
CALCIUM: 9.5 mg/dL (ref 8.7–10.3)
CO2: 28 mmol/L (ref 18–29)
Chloride: 105 mmol/L (ref 96–106)
Creatinine, Ser: 0.6 mg/dL (ref 0.57–1.00)
GFR, EST AFRICAN AMERICAN: 114 mL/min/{1.73_m2} (ref >59)
GFR, EST NON AFRICAN AMERICAN: 99 mL/min/{1.73_m2} (ref >59)
GLOBULIN, TOTAL: 2.3 g/dL (ref 1.5–4.5)
Glucose: 99 mg/dL (ref 65–99)
POTASSIUM: 4.4 mmol/L (ref 3.5–5.2)
SODIUM: 146 mmol/L — AB (ref 134–144)
TOTAL PROTEIN: 6.7 g/dL (ref 6.0–8.5)

## 2016-03-30 ENCOUNTER — Encounter: Payer: Self-pay | Admitting: Internal Medicine

## 2016-05-02 ENCOUNTER — Other Ambulatory Visit: Payer: Self-pay | Admitting: Internal Medicine

## 2016-05-02 ENCOUNTER — Ambulatory Visit
Admission: RE | Admit: 2016-05-02 | Discharge: 2016-05-02 | Disposition: A | Discharge status: Home / Self Care | Payer: 59 | Source: Ambulatory Visit | Attending: Internal Medicine | Admitting: Internal Medicine

## 2016-05-02 DIAGNOSIS — R109 Unspecified abdominal pain: Secondary | ICD-10-CM

## 2016-05-02 DIAGNOSIS — Z8719 Personal history of other diseases of the digestive system: Secondary | ICD-10-CM

## 2016-05-02 MED ORDER — IOPAMIDOL (ISOVUE-300) INJECTION 61%
100.0000 mL | Freq: Once | INTRAVENOUS | Status: AC | PRN
  Administered 2016-05-02: 100 mL via INTRAVENOUS

## 2016-05-03 ENCOUNTER — Ambulatory Visit: Payer: Self-pay | Admitting: Internal Medicine

## 2016-05-06 ENCOUNTER — Institutional Professional Consult (permissible substitution): Payer: BLUE CROSS/BLUE SHIELD | Admitting: Neurology

## 2016-05-06 DIAGNOSIS — N83209 Unspecified ovarian cyst, unspecified side: Secondary | ICD-10-CM | POA: Insufficient documentation

## 2016-05-07 ENCOUNTER — Other Ambulatory Visit: Payer: Self-pay | Admitting: Internal Medicine

## 2016-05-07 DIAGNOSIS — Z720 Tobacco use: Secondary | ICD-10-CM

## 2016-05-13 ENCOUNTER — Ambulatory Visit (INDEPENDENT_AMBULATORY_CARE_PROVIDER_SITE_OTHER): Payer: 59 | Admitting: Neurology

## 2016-05-13 ENCOUNTER — Encounter: Payer: Self-pay | Admitting: Neurology

## 2016-05-13 VITALS — BP 112/74 | HR 60 | Resp 16 | Ht 59.25 in | Wt 188.0 lb

## 2016-05-13 DIAGNOSIS — G4733 Obstructive sleep apnea (adult) (pediatric): Secondary | ICD-10-CM | POA: Diagnosis not present

## 2016-05-13 DIAGNOSIS — I480 Paroxysmal atrial fibrillation: Secondary | ICD-10-CM | POA: Diagnosis not present

## 2016-05-13 DIAGNOSIS — R9430 Abnormal result of cardiovascular function study, unspecified: Secondary | ICD-10-CM

## 2016-05-13 DIAGNOSIS — Z9989 Dependence on other enabling machines and devices: Secondary | ICD-10-CM

## 2016-05-13 DIAGNOSIS — I5041 Acute combined systolic (congestive) and diastolic (congestive) heart failure: Secondary | ICD-10-CM

## 2016-05-13 NOTE — Progress Notes (Signed)
SLEEP MEDICINE CLINIC   Provider:  Larey Seat, M D  Referring Provider: Primary Care Physician:   Chief Complaint  Patient presents with  . Sleep Consult    Rm 10. Patient last had a sleep study in 2012. She was placed on CPAP and is currently using it. DME: [facility]    HPI:  Sharon Cain Cain is a 61 y.o. 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 .    Sharon Cain Cain reports that she had a sleep study at the [facility] in 2012, and was issued a CPAP machine. I do not have her original sleep study available here, but the patient soon after he lost insurance and was for a while followed on [address] by Sharon Cain Cain. She has now regained insurance and is followed by Sharon Cain Cain.  Sharon Cain Cain reports that she  gained 40 pounds since atrial fib was diagnosed- in September 2017 . She  is no longer sleeping through the night, wakes up frequently and feels exhausted from tossing and turning. She started on amiodarone and  Chronic anticoagulation.   She has been compliant as a CPAP user and her ResMed S9 machine was brought to this appointment. We were able to obtain 100% compliance download for the last 30 days with an average user time of 8 hours and 58 minutes, CPAP is set at 12 cm water pressure with 3 cm EPR and her residual AHI is 3.1. The residual apneas consist mainly of obstructive events. She does have some moderate air leaks. The patient endorsed a history of hypertension, depression, anxiety, hypercholesterolemia, atrial fibrillation, GERD, cholecystectomy in 2008 she quit smoking in September 2017 and has been successful.   She has not used any sleep aids.He is also followed by Sharon Cain Cain gynecology, Sharon Cain Cain has seen her for cardiac issues, atypical chest pain. The patient also suffers from systolic chronic heart failure and is currently on  Norvasc-amlodipine ramipril has been discontinued her left ventricular ejection fraction was 35%. Again she has been diagnosed with OSA and is using CPAP. She had pancreatitis after ramipril ( ?). Sharon Cain Cain follows her with regular amylase, lipase,  thyroid tests and metabolic panels. Her TSH was high at 5.62 in January , the MCV was high at 108.57fL.  Sleep habits are as follows: Sharon Cain Cain usually goes to bed between 10 PM and midnight, usually she reads in bed before she falls asleep. She takes the [employer] over-the-counter sleep aids nightly, 25 mg Benadryl. Her physicians have attributed her leg pain to her weight gain. She takes extra strength Tylenol at night as well and this helps. She often wakes up after 2 or 3 hours of sleep and feels wide awake. She struggles than for an hour or more to go back to sleep. She shares a bedroom with her husband, the bedroom is cool, quiet and dark. He is also a CPAP user. She sleeps on her side. She sleeps on one pillow. There are no pets in the bedroom. She may have one bathroom break if any. She rises at 7 AM, by alarm - her work starts at 8.30.  Rarely has she ever slept through the alarm. She denies waking up with headaches, discomfort, confusion, vertigo, again neither palpitation or diaphoresis. Her eyes can be dry -which could be related to a leak on CPAP.  Sleep medical history  and family sleep history: no other family member with a diagnosis of OSA.  Social history:  Quit smoking after 40 plus pack years. No ETOH, caffeine user-  Iced tea , less than one a week.   Review of Systems:Chief complaint according to patient : [name] describes very restless sleep and is not sure why.  CPAP use to refresh her and allowed her to sleep through the night but this has changed. She does not have nocturia, she does not wake from pain, palpitations, diaphoresis, nausea vertigo. She describes none of the symptoms that I could attribute to nocturnal panic or  anxiety attacks.  Out of a complete 14 system review, the patient complains of only the following symptoms, and all other reviewed systems are negative. CPAP user , needing tubing, headgear, water reservoir and mask.  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 ( Dr . Cyndy Cain)  Epworth score 4  , Fatigue severity score high at 38  , depression score 2/15 ,    Social History   Social History  . Marital status: Married    Spouse name: N/A  . Number of children: N/A  . Years of education: N/A   Occupational History  . Not on file.   Social History Main Topics  . Smoking status: Former Smoker    Packs/day: 1.00    Types: Cigarettes    Quit date: 12/09/2015  . Smokeless tobacco: Never Used  . Alcohol use No  . Drug use: No  . Sexual activity: Not on file   Other Topics Concern  . Not on file   Social History Narrative  . No narrative on file    Family History  Problem Relation Age of Onset  . Atrial fibrillation Mother   . Diabetes Mother   . Heart failure Mother   . COPD Mother   . COPD Father   . Congestive Heart Failure Father   . Sudden Cardiac Death Father     Past Medical History:  Diagnosis Date  . A-fib ([place]) 2017  . Anxiety   . Arthritis   . Chest pain, central 11/15/2010   Patient admitted to hospital for cardiac rule-out. No MI.    . Depression   . DEPRESSION 04/02/2007   Qualifier: Diagnosis of  By: Sharon Cain Baltimore MD, Janett Billow    . Hyperlipidemia   . Hypertension   . Insomnia   . Obesity     Past Surgical History:  Procedure Laterality Date  . CARPAL TUNNEL RELEASE     bilateral  . CHOLECYSTECTOMY      Current Outpatient Prescriptions  Medication Sig Dispense Refill  . amiodarone (PACERONE) 200 MG tablet Take 1 tablet (200 mg total) by mouth daily. (Patient taking differently: Take 100 mg by mouth daily. ) 30 tablet 3  . amLODipine (NORVASC) 5 MG tablet     .  apixaban (ELIQUIS) 5 MG TABS tablet Take 1 tablet (5 mg total) by mouth 2 (two) times daily. 60 tablet 3  . atorvastatin (LIPITOR) 40 MG tablet Take 1 tablet (40 mg total) by mouth daily at 6 PM. 30 tablet 11  . busPIRone (BUSPAR) 10 MG tablet TAKE ONE TABLET BY MOUTH THREE TIMES DAILY AS NEEDED (Patient taking differently: Take 10 mg by mouth three times a day as needed for anxiety) 30 tablet 0  . carvedilol (COREG) 3.125 MG tablet Take 1 tablet (3.125 mg total) by mouth 2 (two)  times daily with a meal. 60 tablet 2  . cetirizine (ZYRTEC) 10 MG tablet Take 1 tablet (10 mg total) by mouth daily. 30 tablet 11  . Omega-3 Fatty Acids (FISH OIL) 1000 MG CAPS Take 2 capsules by mouth every morning.      No current facility-administered medications for this visit.     Allergies as of 05/13/2016 - Review Complete 05/13/2016  Allergen Reaction Noted  . Sulfa antibiotics  01/31/2016    Vitals: BP 112/74   Pulse 60   Resp 16   Ht 4' 11.25" (1.505 m)   Wt 188 lb (85.3 kg)   BMI 37.65 kg/m  Last Weight:  Wt Readings from Last 1 Encounters:  05/13/16 188 lb (85.3 kg)   [id number] mass index is 37.65 kg/m.     Last Height:   Ht Readings from Last 1 Encounters:  05/13/16 4' 11.25" (1.505 m)    Physical exam:  General: The patient is awake, alert and appears not in acute distress.  Head: Normocephalic, atraumatic. Neck is supple. Mallampati 4 , poor dental status.   neck circumference:15  Nasal airflow patent ,  Retrognathia is seen.  Cardiovascular: irregular rate and rhythm , without  murmurs or carotid bruit, and without distended neck veins. Respiratory: Lungs are clear to auscultation. Skin:  Without evidence of edema, or rash Trunk: BMI is 38. The patient's posture is erect  Neurologic exam : The patient is awake and alert, oriented to place and time.   Memory subjective described as intact.  Attention span & concentration ability appears normal.  Hearing impaired.  Speech is fluent,   without dysarthria, dysphonia or aphasia.  Mood and affect are appropriate.  Cranial nerves: Pupils are equal and not reactive to light. Funduscopic exam without evidence of pallor or edema.  Extraocular movements  without nystagmus. The patient presents with a horizontal, bilateral abducens palsy!  Visual fields by finger perimetry are intact. Hearing is impaired - Facial sensation intact to fine touch. Facial motor strength is symmetric and tongue and uvula move midline.  Shoulder shrug was symmetrical.   Motor exam:Normal tone, muscle bulk and symmetric strength in all extremities.  Sensory:  Fine touch, pinprick and vibration were tested in all extremities. Proprioception tested in the upper extremities was normal.  Coordination: Rapid alternating movements in the fingers/hands was normal. Finger-to-nose maneuver  normal without evidence of ataxia, dysmetria or tremor.  Gait and station: Patient walks without assistive device and is able unassisted to climb up to the exam table. Strength within normal limits.  Stance is stable and normal.   Romberg testing is negative.  Deep tendon reflexes: in the  upper and lower extremities are symmetric and intact. Babinski maneuver response is downgoing.  The patient was advised of the nature of the diagnosed sleep disorder , the treatment options and risks for general a health and wellness arising from not treating the condition.  I spent more than 45  minutes of face to face time with the patient. Greater than 50% of time was spent in counseling and coordination of care. We have discussed the diagnosis and differential and I answered the patient's questions.     Assessment:  After physical and neurologic examination, review of laboratory studies,  Personal review of imaging studies, reports of other /same  Imaging studies ,  Results of polysomnography/ neurophysiology testing and pre-existing records as far as provided in visit., my assessment is    1) Sharon Cain Cain presents with a well treated  sleep apnea, her residual AHI is only less than 5 at 3.1 per hour. I do not know where her baseline once was, but since she has gained weight and has a condition of atrial fibrillation I am sure that she still suffers from obstructive sleep apnea and that continued treatment with CPAP is advisable. I would like to change her machine to an outer titrated. In order to confirm to her insurance carrier that she still suffers from atrial fibrillation,  congestive heart failure, systolic, I would like to order a home sleep test. I want to see where her baseline now is before we go and use any auto-titration on her. An attended sleep study would be preferred, but cannot be obtained through [place] health care.   Plan:  Treatment plan and additional workup :  OSA, atrial fibrillation on chronic anticoagulation, amiodarone and  HTN medication.   HST and  Autotitration, patient may feel less rested because she emergently needs new CPAP supplies. [facility]>  Asencion Partridge Citlally Captain MD  05/13/2016   CC: Dr Ardeth Cain

## 2016-05-13 NOTE — Patient Instructions (Signed)
Sleep Studies A sleep study (polysomnogram) is a series of tests done while you are sleeping. It can show how well you sleep. This can help your health care provider diagnose a sleep disorder and show how severe your sleep disorder is. A sleep study may lead to treatment that will help you sleep better and prevent other medical problems caused by poor sleep. If you have a sleep disorder, you may also be at risk for:  Sleep-related accidents.  High blood pressure.  Heart disease.  Stroke.  Other medical conditions. Sleep disorders are common. Your health care provider may suspect a sleep disorder if you:  Have loud snoring most nights.  Have brief periods when you stop breathing at night.  Feel sleepy on most days.  Fall asleep suddenly during the day.  Have trouble falling asleep or staying asleep.  Feel like you need to move your legs when trying to fall asleep.  Have dreams that seem very real shortly after falling asleep.  Feel like you cannot move when you first wake up. Which tests will I need to have? Most sleep studies last all night and include these tests:  Recordings of your brain activity.  Recordings of your eye movements.  Recording of your heart rate and rhythm.  Blood pressure readings.  Readings of the amount of oxygen in your blood.  Measurements of your chest and belly movement as you breathe during sleep. If you have signs of the sleep disorder called sleep apnea during your test, you may get a mask to wear for the second half of the night.  The mask provides continuous positive airway pressure (CPAP). This may improve sleep apnea significantly.  You will then have all tests done again with the mask in place to see if your measurements and recordings change. How are sleep studies done? Most sleep studies are done over one full night of sleep.  You will arrive at the study center in the evening and can go home in the morning.  Bring your pajamas  and toothbrush.  Do not have caffeine on the day of your sleep study.  Your health care provider will let you know if you need to stop taking any of your regular medicines before the test. To do the tests included in a polysomnogram, you will have:  Round, sticky patches with sensors attached to recording wires (electrodes) placed on your scalp, face, chest, and limbs.  Wires from all the electrodes and sensors run from your bed to a computer. The wires can be taken off and put back on if you need to get out of bed to go to the bathroom.  A sensor placed over your nose to measure airflow.  A finger clip put on one finger to measure your blood oxygen level.  A belt around your belly and a belt around your chest to measure breathing movements. Where are sleep studies done? Sleep studies are done at sleep centers. A sleep center may be inside a hospital, office, or clinic. The room where you have the study may look like a hospital room or a hotel room. The health care providers doing the study may come in and out of the room during the study. Most of the time, they will be in another room monitoring your test. How is information from sleep studies helpful? A polysomnogram can be used along with your medical history and a physical exam to diagnose conditions, such as:  Sleep apnea.  Restless legs syndrome.  Sleep-related   seizure disorders.  Sleep-related movement disorders. A medical doctor who specializes in sleep will evaluate your sleep study. The specialist will share the results with your primary health care provider. Treatments based on your sleep study may include:  Improving your sleep habits (sleep hygiene).  Wearing a CPAP mask.  Wearing an oral device at night to improve breathing and reduce snoring.  Taking medicine for:  Restless legs syndrome.  Sleep-related seizure disorder.  Sleep-related movement disorder. This information is not intended to replace advice  given to you by your health care provider. Make sure you discuss any questions you have with your health care provider. Document Released: 09/15/2002 Document Revised: 11/05/2015 Document Reviewed: 05/17/2013 Elsevier Interactive Patient Education  2017 [employer]. Atrial Fibrillation Introduction Atrial fibrillation is a type of heartbeat that is irregular or fast (rapid). If you have this condition, your heart keeps quivering in a weird (chaotic) way. This condition can make it so your heart cannot pump blood normally. Having this condition gives a person more risk for stroke, heart failure, and other heart problems. There are different types of atrial fibrillation. Talk with your doctor to learn about the type that you have. Follow these instructions at home:  Take over-the-counter and prescription medicines only as told by your doctor.  If your doctor prescribed a blood-thinning medicine, take it exactly as told. Taking too much of it can cause bleeding. If you do not take enough of it, you will not have the protection that you need against stroke and other problems.  Do not use any tobacco products. These include cigarettes, chewing tobacco, and e-cigarettes. If you need help quitting, ask your doctor.  If you have apnea (obstructive sleep apnea), manage it as told by your doctor.  Do not drink alcohol.  Do not drink beverages that have caffeine. These include coffee, soda, and tea.  Maintain a healthy weight. Do not use diet pills unless your doctor says they are safe for you. Diet pills may make heart problems worse.  Follow diet instructions as told by your doctor.  Exercise regularly as told by your doctor.  Keep all follow-up visits as told by your doctor. This is important. Contact a doctor if:  You notice a change in the speed, rhythm, or strength of your heartbeat.  You are taking a blood-thinning medicine and you notice more bruising.  You get tired more easily when  you move or exercise. Get help right away if:  You have pain in your chest or your belly (abdomen).  You have sweating or weakness.  You feel sick to your stomach (nauseous).  You notice blood in your throw up (vomit), poop (stool), or pee (urine).  You are short of breath.  You suddenly have swollen feet and ankles.  You feel dizzy.  Your suddenly get weak or numb in your face, arms, or legs, especially if it happens on one side of your body.  You have trouble talking, trouble understanding, or both.  Your face or your eyelid droops on one side. These symptoms may be an emergency. Do not wait to see if the symptoms will go away. Get medical help right away. Call your local emergency services (911 in the [place] Do not drive yourself to the hospital.  This information is not intended to replace advice given to you by your health care provider. Make sure you discuss any questions you have with your health care provider. Document Released: 12/19/2007 Document Revised: 08/17/2015 Document Reviewed: 07/06/2014  2017  Elsevier

## 2016-05-20 ENCOUNTER — Telehealth: Payer: Self-pay

## 2016-05-20 NOTE — Telephone Encounter (Signed)
Ok thank you so much. It does not look like we were able to obtain a copy, so we will need to repeat her sleep study in the future.  ===View-only below this line===  ----- Message ----- From: Patsy Baltimore Sent: 05/20/2016  10:24 AM To: Alexis Frock, RN  Cornelious Bryant, She is new to [facility] and we do not have sleep study for her. However since this is for supplies only and she doesn't have a Medicare plan then we can move forward.   If she ever needs a replacement pap unit then her original diagnostic sleep study will have to be located.   She has scheduled an appt for new supplies on 3/2 with our RT.   Thanks.   Angie ----- Message ----- From: Laurence Spates, RN Sent: 05/14/2016   9:38 AM To: Patsy Baltimore  Ok just let me know. Sleep study was done 2012, at [facility], which is now closed. She states that she used [facility] for her supplies. We would not be able to get a copy of this study at this point so let me know if we need to do anything.  Thank you ----- Message ----- From: Patsy Baltimore Sent: 05/14/2016   8:22 AM To: Alexis Frock, RN  Thank you Beverlee Nims, Order has been sent for review. I do believe this is a new pt to [facility] and we will have to have her original diagnotic sleep study but I will let you know for sure once it's been reviewed.   Thanks. Angie ----- Message ----- From: Laurence Spates, RN Sent: 05/13/2016   5:09 PM To: Mary A Ozimek  Orders placed for patient.  Thank you!!

## 2016-06-05 ENCOUNTER — Ambulatory Visit (INDEPENDENT_AMBULATORY_CARE_PROVIDER_SITE_OTHER): Payer: 59 | Admitting: Neurology

## 2016-06-05 DIAGNOSIS — I5041 Acute combined systolic (congestive) and diastolic (congestive) heart failure: Secondary | ICD-10-CM

## 2016-06-05 DIAGNOSIS — G4733 Obstructive sleep apnea (adult) (pediatric): Secondary | ICD-10-CM | POA: Diagnosis not present

## 2016-06-05 DIAGNOSIS — R9430 Abnormal result of cardiovascular function study, unspecified: Secondary | ICD-10-CM

## 2016-06-05 DIAGNOSIS — Z9989 Dependence on other enabling machines and devices: Principal | ICD-10-CM

## 2016-06-05 DIAGNOSIS — I480 Paroxysmal atrial fibrillation: Secondary | ICD-10-CM

## 2016-06-06 NOTE — Addendum Note (Signed)
Addended by: Larey Seat on: 06/06/2016 06:00 PM   Modules accepted: Orders

## 2016-06-06 NOTE — Procedures (Signed)
[facility] [address] [place], [place] [address] NAME: Laraina Sulton  DOB: [birth year] MEDICAL RECORD [id number] DOS: 06/05/16 REFERRING PHYSICIAN:  Dr. Ardeth Perfect Study performed: HST/Out of Center Sleep test HISTORY: TAMATHA GADBOIS is a 61 y.o. female seen here as a referral from Dr. Ardeth Perfect , to establish the patient with a sleep medicine specialist and get new CPAP supplies. Mrs. Probert reports that she had a sleep study at the [facility] in 2012 and was issued a CPAP machine. I do not have her original sleep study available here, but the patient soon after lost insurance and could not afford CPAP supplies,  The patient endorsed a history of hypertension, depression, anxiety, hypercholesterolemia, atrial fibrillation, GERD, cholecystectomy 2008, and quit smoking in September 2017. Mrs. Ruppe reports that she gained 40 pounds since being diagnosed with Atrial fib September 2017, and since is no longer sleeping through the night, wakes up frequently and feels exhausted, tossing and turning. She started on Amiodarone and chronic anticoagulation.    She has been compliant as a CPAP user and her ResMed S9 machine was brought to this appointment. We were able to obtain 100% compliance download for the last 30 days with an average user time of 8 hours and 58 minutes, CPAP is set at 12 cm water pressure with 3 cm EPR and her residual AHI is 3.1. The residual apneas consist mainly of obstructive events. She does have some moderate air leaks.     Epworth score 4, Fatigue severity score high at 38, depression score 2/15 ,    STUDY RESULTS: Total Recording Time: 4h 67m with a Total Apnea/Hypopnea Index (AHI) of 11.7/hr. Average Oxygen Saturation: SpO2 89%, Lowest Oxygen Saturation: SpO2 85%, desaturation time at or below SpO2 of  88% was 84 minutes. Average Heart Rate: 55 bpm,   IMPRESSION:  1 )Mild sleep apnea with clinically significant  hypoxemia. 2) Tachy-brady arrhythmia noted with heart rate variability from 43 through 196 bpm, indicating atrial fibrillation persisted at night. RECOMMENDATION: CPAP use needs to be re-instated , the patient will be supplied with all necessary CPAP supplies.  I certify that I have reviewed the raw data recording prior to the issuance of this report in accordance with the standards of Accreditation of the American Academy of Sleep medicine (AASM) Larey Seat, MD  06-06-2016  Diplomat, American Board of Psychiatry and Neurology Diplomat, American Board of Sleep Medicine Medical Director of  [facility] [employer]

## 2016-06-07 ENCOUNTER — Telehealth: Payer: Self-pay | Admitting: Neurology

## 2016-06-07 NOTE — Telephone Encounter (Signed)
Patient called office stating she was returning a call, not certain what it is about. Please call

## 2016-06-08 ENCOUNTER — Emergency Department (HOSPITAL_COMMUNITY)
Admission: EM | Admit: 2016-06-08 | Discharge: 2016-06-08 | Disposition: A | Discharge status: Home / Self Care | Payer: 59 | Attending: Emergency Medicine | Admitting: Emergency Medicine

## 2016-06-08 ENCOUNTER — Emergency Department (HOSPITAL_COMMUNITY): Payer: 59

## 2016-06-08 ENCOUNTER — Encounter (HOSPITAL_COMMUNITY): Payer: Self-pay

## 2016-06-08 DIAGNOSIS — I110 Hypertensive heart disease with heart failure: Secondary | ICD-10-CM | POA: Insufficient documentation

## 2016-06-08 DIAGNOSIS — Z79899 Other long term (current) drug therapy: Secondary | ICD-10-CM | POA: Diagnosis not present

## 2016-06-08 DIAGNOSIS — I493 Ventricular premature depolarization: Secondary | ICD-10-CM | POA: Diagnosis not present

## 2016-06-08 DIAGNOSIS — Z87891 Personal history of nicotine dependence: Secondary | ICD-10-CM | POA: Diagnosis not present

## 2016-06-08 DIAGNOSIS — R079 Chest pain, unspecified: Secondary | ICD-10-CM | POA: Diagnosis present

## 2016-06-08 DIAGNOSIS — I5022 Chronic systolic (congestive) heart failure: Secondary | ICD-10-CM | POA: Diagnosis not present

## 2016-06-08 LAB — BASIC METABOLIC PANEL
ANION GAP: 10 (ref 5–15)
BUN: 18 mg/dL (ref 6–20)
CALCIUM: 9.4 mg/dL (ref 8.9–10.3)
CO2: 23 mmol/L (ref 22–32)
Chloride: 106 mmol/L (ref 101–111)
Creatinine, Ser: 0.66 mg/dL (ref 0.44–1.00)
GFR calc Af Amer: >60 mL/min (ref >60)
GLUCOSE: 122 mg/dL — AB (ref 65–99)
Potassium: 3.7 mmol/L (ref 3.5–5.1)
Sodium: 139 mmol/L (ref 135–145)

## 2016-06-08 LAB — CBC
HCT: 41.3 % (ref 36.0–46.0)
HEMOGLOBIN: 13.6 g/dL (ref 12.0–15.0)
MCH: 31.1 pg (ref 26.0–34.0)
MCHC: 32.9 g/dL (ref 30.0–36.0)
MCV: 94.3 fL (ref 78.0–100.0)
Platelets: 164 10*3/uL (ref 150–400)
RBC: 4.38 MIL/uL (ref 3.87–5.11)
RDW: 14.1 % (ref 11.5–15.5)
WBC: 5.9 10*3/uL (ref 4.0–10.5)

## 2016-06-08 LAB — I-STAT TROPONIN, ED
TROPONIN I, POC: 0.01 ng/mL (ref 0.00–0.08)
Troponin i, poc: 0.01 ng/mL (ref 0.00–0.08)

## 2016-06-08 MED ORDER — POTASSIUM CHLORIDE CRYS ER 20 MEQ PO TBCR
40.0000 meq | EXTENDED_RELEASE_TABLET | Freq: Once | ORAL | Status: AC
Start: 2016-06-08 — End: 2016-06-08
  Administered 2016-06-08: 40 meq via ORAL
  Filled 2016-06-08: qty 2

## 2016-06-08 MED ORDER — POTASSIUM CHLORIDE CRYS ER 20 MEQ PO TBCR: 20.0000 meq | EXTENDED_RELEASE_TABLET | Freq: Two times a day (BID) | ORAL | 0 refills | Status: DC

## 2016-06-08 NOTE — ED Provider Notes (Signed)
[facility] DEPT Provider Note   CSN: [id number] Arrival date & time: 06/08/16  0027     History   Chief Complaint Chief Complaint  Patient presents with  . Chest Pain    HPI Sharon Cain is a 61 y.o. female.  Patient presents for evaluation of palpitations associated with brief, sharp chest pain that started yesterday. She describes an elevated heart rate also like skipping beats that cause chest discomfort that radiates into her left neck and lasts seconds before resolving. No syncope or near syncope. She denies SOB, nausea, diaphoresis. She denies symptoms currently but reports she has had episodes since arrival in the hospital.    The history is provided by the patient. No language interpreter was used.    Past Medical History:  Diagnosis Date  . A-fib ([place]) 2017  . Anxiety   . Arthritis   . Chest pain, central 11/15/2010   Patient admitted to hospital for cardiac rule-out. No MI.    . Depression   . DEPRESSION 04/02/2007   Qualifier: Diagnosis of  By: Henderson Baltimore MD, Janett Billow    . Hyperlipidemia   . Hypertension   . Insomnia   . Obesity     Patient Active Problem List   Diagnosis Date Noted  . Chronic systolic CHF (congestive heart failure) ([place]) 01/15/2016  . Hyperglycemia 12/12/2015  . New onset atrial fibrillation ([place]) 12/10/2015  . Hypokalemia 12/10/2015  . Atrial fibrillation ([place]) 12/10/2015  . Back pain with left-sided sciatica 12/24/2012  . OBESITY 07/26/2009  . INSOMNIA-SLEEP DISORDER-UNSPEC 05/18/2007  . OSTEOARTHRITIS 04/02/2007  . HYPERLIPIDEMIA 03/30/2007  . Anxiety state 03/30/2007  . Tobacco abuse 03/30/2007    Past Surgical History:  Procedure Laterality Date  . CARPAL TUNNEL RELEASE     bilateral  . CHOLECYSTECTOMY      OB History    No data available       Home Medications    Prior to Admission medications   Medication Sig Start Date End Date Taking? Authorizing Provider  amiodarone (PACERONE) 200 MG tablet Take 1 tablet  (200 mg total) by mouth daily. Patient taking differently: Take 100 mg by mouth daily.  12/11/15   Reyne Dumas, MD  amLODipine (NORVASC) 5 MG tablet  05/03/16   Historical Provider, MD  apixaban (ELIQUIS) 5 MG TABS tablet Take 1 tablet (5 mg total) by mouth 2 (two) times daily. 12/11/15   Reyne Dumas, MD  atorvastatin (LIPITOR) 40 MG tablet Take 1 tablet (40 mg total) by mouth daily at 6 PM. 01/31/16   Mack Hook, MD  busPIRone (BUSPAR) 10 MG tablet TAKE ONE TABLET BY MOUTH THREE TIMES DAILY AS NEEDED Patient taking differently: Take 10 mg by mouth three times a day as needed for anxiety 01/02/15   Rosemarie Ax, MD  carvedilol (COREG) 3.125 MG tablet Take 1 tablet (3.125 mg total) by mouth 2 (two) times daily with a meal. 12/11/15   Reyne Dumas, MD  cetirizine (ZYRTEC) 10 MG tablet Take 1 tablet (10 mg total) by mouth daily. 01/31/16   Mack Hook, MD  Omega-3 Fatty Acids (FISH OIL) 1000 MG CAPS Take 2 capsules by mouth every morning.     Historical Provider, MD    Family History Family History  Problem Relation Age of Onset  . Atrial fibrillation Mother   . Diabetes Mother   . Heart failure Mother   . COPD Mother   . COPD Father   . Congestive Heart Failure Father   . Sudden Cardiac  Death Father     Social History Social History  Substance Use Topics  . Smoking status: Former Smoker    Packs/day: 1.00    Types: Cigarettes    Quit date: 12/09/2015  . Smokeless tobacco: Never Used  . Alcohol use No     Allergies   Sulfa antibiotics   Review of Systems Review of Systems  Constitutional: Negative for chills and fever.  HENT: Negative.   Respiratory: Negative.  Negative for shortness of breath.   Cardiovascular: Positive for chest pain and palpitations. Negative for leg swelling.  Gastrointestinal: Negative.   Musculoskeletal: Negative.   Skin: Negative.   Neurological: Negative.  Negative for syncope and headaches.     Physical Exam Updated Vital  Signs BP 112/75   Pulse (!) 51   Temp 98.5 F (36.9 C) (Oral)   Resp 17   SpO2 97%   Physical Exam  Constitutional: She is oriented to person, place, and time. She appears well-developed and well-nourished.  HENT:  Head: Normocephalic.  Neck: Normal range of motion. Neck supple.  Cardiovascular: Normal rate and regular rhythm.   No murmur heard. Pulmonary/Chest: Effort normal and breath sounds normal. She has no wheezes. She has no rales.  Abdominal: Soft. Bowel sounds are normal. There is no tenderness. There is no rebound and no guarding.  Musculoskeletal: Normal range of motion. She exhibits no edema.  Neurological: She is alert and oriented to person, place, and time.  Skin: Skin is warm and dry. No rash noted.  Psychiatric: She has a normal mood and affect.     ED Treatments / Results  Labs (all labs ordered are listed, but only abnormal results are displayed) Labs Reviewed  BASIC METABOLIC PANEL - Abnormal; Notable for the following:       Result Value   Glucose, Bld 122 (*)    All other components within normal limits  CBC  I-STAT TROPOININ, ED   Results for orders placed or performed during the hospital encounter of 30/86/57  Basic metabolic panel  Result Value Ref Range   Sodium 139 135 - 145 mmol/L   Potassium 3.7 3.5 - 5.1 mmol/L   Chloride 106 101 - 111 mmol/L   CO2 23 22 - 32 mmol/L   Glucose, Bld 122 (H) 65 - 99 mg/dL   BUN 18 6 - 20 mg/dL   Creatinine, Ser 0.66 0.44 - 1.00 mg/dL   Calcium 9.4 8.9 - 10.3 mg/dL   GFR calc non Af Amer >60 >60 mL/min   GFR calc Af Amer >60 >60 mL/min   Anion gap 10 5 - 15  CBC  Result Value Ref Range   WBC 5.9 4.0 - 10.5 K/uL   RBC 4.38 3.87 - 5.11 MIL/uL   Hemoglobin 13.6 12.0 - 15.0 g/dL   HCT 41.3 36.0 - 46.0 %   MCV 94.3 78.0 - 100.0 fL   MCH 31.1 26.0 - 34.0 pg   MCHC 32.9 30.0 - 36.0 g/dL   RDW 14.1 11.5 - 15.5 %   Platelets 164 150 - 400 K/uL  I-stat troponin, ED  Result Value Ref Range   Troponin i, poc  0.01 0.00 - 0.08 ng/mL   Comment 3          I-Stat Troponin, ED (not at [facility])  Result Value Ref Range   Troponin i, poc 0.01 0.00 - 0.08 ng/mL   Comment 3             EKG  EKG  Interpretation  Date/Time:  Saturday June 08 2016 00:32:25 EDT Ventricular Rate:  55 PR Interval:  174 QRS Duration: 114 QT Interval:  426 QTC Calculation: 407 R Axis:   18 Text Interpretation:  Sinus bradycardia Cannot rule out Anterior infarct , age undetermined Abnormal ECG Nonspecific T wave abnormality When compared with ECG of 12/11/2015, No significant change was found Confirmed by [facility]  MD, DAVID ([id number]) on 06/08/2016 2:26:59 AM       Radiology Dg Chest 2 View  Result Date: 06/08/2016 CLINICAL DATA:  Chest pain chest radiograph 12/09/2015 EXAM: CHEST  2 VIEW COMPARISON:  None. FINDINGS: Mild cardiomegaly Both lungs are clear. The visualized skeletal structures are unremarkable. IMPRESSION: Mild cardiomegaly without active cardiopulmonary disease. Electronically Signed   By: Ulyses Jarred M.D.   On: 06/08/2016 01:33    Procedures Procedures (including critical care time)  Medications Ordered in ED Medications - No data to display   Initial Impression / Assessment and Plan / ED Course  I have reviewed the triage vital signs and the nursing notes.  Pertinent labs & imaging results that were available during my care of the patient were reviewed by me and considered in my medical decision making (see chart for details).     Patient with symptoms of skipping beats and chest pain that lasts seconds per episode, starting yesterday. Labs are reassuring. EKG unremarkable. Discussed with Dr. Roxanne Mins who will see the patient.  She is found to have PVC's on the monitor that is symptomatic with presenting symptoms while being examined by Dr. Roxanne Mins. No concern for ACS.  She is felt stable for discharge home with cardiology follow up. Patient and family are comfortable with discharge home.   Final Clinical  Impressions(s) / ED Diagnoses   Final diagnoses:  None   1. Symptomatic PVCs  New Prescriptions New Prescriptions   No medications on file     Charlann Lange, Hershal Coria 49/75/30 0511    Delora Fuel, MD 05/06/15 3567

## 2016-06-08 NOTE — ED Triage Notes (Signed)
Pt states that about two hours ago she started having CP with radiation to L neck. Pt states she feels like her heart is skipping a beat. Hx of afib. Spoke with her cardiologists and told to come here, some SOB, denies n/v

## 2016-06-10 ENCOUNTER — Telehealth: Payer: Self-pay

## 2016-06-10 NOTE — Telephone Encounter (Signed)
-----   Message from Larey Seat, MD sent at 06/06/2016  6:00 PM EDT ----- STUDY RESULTS: Total Recording Time: 4h 75m with a Total Apnea/Hypopnea Index (AHI) of 11.7/hr. Average Oxygen Saturation: SpO2 89%, Lowest Oxygen Saturation: SpO2 85%, desaturation time at or below SpO2 of  88% was 84 minutes. Average Heart Rate: 55 bpm,   IMPRESSION:  1 )Mild sleep apnea with clinically significant hypoxemia. 2) Tachy-brady arrhythmia noted with heart rate variability from 43 through 196 bpm, indicating atrial fibrillation persisted at night. RECOMMENDATION: CPAP use needs to be re-instated , the patient will be supplied with all necessary CPAP supplies, an order for a new CPAP is written.  I certify that I have reviewed the raw data recording prior to the issuance of this report in accordance with the standards of Accreditation of the [place] [employer] (AASM) Larey Seat, MD  06-06-2016

## 2016-06-10 NOTE — Telephone Encounter (Signed)
No calls from me.

## 2016-06-10 NOTE — Telephone Encounter (Signed)
I called pt. I advised her that her sleep study showed mild sleep apnea with clinically significant hypoxemia with tachy-brady arrhythmia . Pt confirmed that she does have afib. Dr. Brett [name] recommends that the pt restart her cpap . Pt says that her cpap is from 2012; pt should be eligible for a new cpap. Pt says that she used [facility]. I reviewed cpap compliance expectations with the pt. A follow up appt was made for 09/12/2016 at 2:30pm. Pt verbalized understanding of results. Pt had no questions at this time but was encouraged to call back if questions arise.

## 2016-07-09 ENCOUNTER — Telehealth: Payer: Self-pay | Admitting: Cardiovascular Disease

## 2016-07-09 NOTE — Telephone Encounter (Signed)
ROI faxed to [facility] office.  F) [phone]

## 2016-07-11 ENCOUNTER — Telehealth: Payer: Self-pay

## 2016-07-11 NOTE — Telephone Encounter (Signed)
Records received from [place]. Placed in Chart Prep room for appointment to be made.

## 2016-07-18 ENCOUNTER — Encounter: Payer: Self-pay | Admitting: *Deleted

## 2016-07-22 NOTE — Progress Notes (Signed)
Cardiology Office Note   Date:  07/26/2016   ID:  Sharon Cain, DOB 1955-08-04, MRN [id number]  PCP:  Velna Hatchet, MD  Cardiologist:   Jenkins Rouge, MD   Chief Complaint  Patient presents with  . Atrial Fibrillation      History of Present Illness: Sharon Cain is a 61 y.o. female who presents for evaluation of symptomatic PVCls history of PAF, HTN. Referred by [facility] ER DR Tawni Carnes. She has seen Dr Terrence Dupont recently and in past so not clear why she is referred here for Cardiology. ER visit March 17/18  with palpitations and atypical chest pain r/o PVCls K supplemented and d/c.  Has OSA on CPAP PAF diagnosed 11/2015 had acute pancreatitis October 2017 echo at that time noted EF 40-45%  Seen by [facility] September 2017 atypical chest pain and found to be in afib. Converted with iv amiodarone.  Myovue personally reviewed no ischemia or infarction EF noted 35% echo done same day EF 40-45% diffuse hypokinesis   I take care of her mother. She has very little insight into her cardiac issues. I tried to explain that she has had PAF and PVCls that account for palpitations She has had significant DCM with EF as low as 35% but no cath. She has significant anxiety which makes things worse. Some tightness in chest and dyspnea when she gets anxious   Past Medical History:  Diagnosis Date  . A-fib ([place]) 2017  . Anxiety   . Arthritis   . Chest pain, central 11/15/2010   Patient admitted to hospital for cardiac rule-out. No MI.    . Depression   . DEPRESSION 04/02/2007   Qualifier: Diagnosis of  By: Henderson Baltimore MD, Janett Billow    . Hyperlipidemia   . Hypertension   . Insomnia   . Obesity     Past Surgical History:  Procedure Laterality Date  . CARPAL TUNNEL RELEASE Bilateral    bilateral  . CHOLECYSTECTOMY     removed before the age of 73     Current Outpatient Prescriptions  Medication Sig Dispense Refill  . amiodarone (PACERONE) 100 MG tablet Take 100 mg by mouth daily.    Marland Kitchen  amLODipine (NORVASC) 5 MG tablet Take 5 mg by mouth daily.     Marland Kitchen apixaban (ELIQUIS) 5 MG TABS tablet Take 1 tablet (5 mg total) by mouth 2 (two) times daily. 60 tablet 3  . atorvastatin (LIPITOR) 40 MG tablet Take 1 tablet (40 mg total) by mouth daily at 6 PM. 30 tablet 11  . busPIRone (BUSPAR) 10 MG tablet TAKE ONE TABLET BY MOUTH THREE TIMES DAILY AS NEEDED (Patient taking differently: Take 10 mg by mouth three times a day as needed for anxiety) 30 tablet 0  . carvedilol (COREG) 6.25 MG tablet Take 6.25 mg by mouth 2 (two) times daily with a meal.    . cetirizine (ZYRTEC) 10 MG tablet Take 1 tablet (10 mg total) by mouth daily. 30 tablet 11  . furosemide (LASIX) 20 MG tablet Take 20 mg by mouth daily.    . Omega-3 Fatty Acids (FISH OIL) 1000 MG CAPS Take 2 capsules by mouth every morning.     . Potassium 99 MG TABS Take 1 tablet by mouth daily.     No current facility-administered medications for this visit.     Allergies:   Sulfa antibiotics; Lisinopril; and Ramipril    Social History:  The patient  reports that she quit smoking about 7 months ago.  Her smoking use included Cigarettes. She smoked 1.00 pack per day. She has never used smokeless tobacco. She reports that she does not drink alcohol or use drugs.   Family History:  [name] includes Atrial fibrillation in her mother; COPD in her father and mother; Congestive Heart Failure in her father; Diabetes in her mother; Heart failure in her mother; Sudden Cardiac Death in her father.    ROS:  Please see the history of present illness.   Otherwise, review of systems are positive for none.   All other systems are reviewed and negative.    PHYSICAL EXAM: VS:  BP 102/60   Pulse (!) 55   Ht 4\' 11"  (1.499 m)   Wt 205 lb (93 kg)   SpO2 99%   BMI 41.40 kg/m  , BMI Body mass index is 41.4 kg/m. Affect appropriate Obese white female  HEENT: normal Neck supple with no adenopathy JVP normal no bruits no  thyromegaly Lungs clear with no wheezing and good diaphragmatic motion Heart:  S1/S2 no murmur, no rub, gallop or click PMI normal Abdomen: benighn, BS positve, no tenderness, no AAA no bruit.  No HSM or HJR Distal pulses intact with no bruits No edema Neuro non-focal Skin warm and dry No muscular weakness    EKG: 3.17.18  SR rate 55 poor R wave progression    Recent Labs: 12/10/2015: Magnesium 2.1 01/31/2016: ALT 13; TSH 1.570 06/08/2016: BUN 18; Creatinine, Ser 0.66; Hemoglobin 13.6; Platelets 164; Potassium 3.7; Sodium 139    Lipid Panel    Component Value Date/Time   CHOL 105 01/14/2016 0908   TRIG 72 01/14/2016 0908   HDL 34 (L) 01/14/2016 0908   CHOLHDL 3.1 01/14/2016 0908   VLDL 14 01/14/2016 0908   LDLCALC 57 01/14/2016 0908   LDLDIRECT 149 (H) 02/27/2010 2015      Wt Readings from Last 3 Encounters:  07/26/16 205 lb (93 kg)  05/13/16 188 lb (85.3 kg)  01/31/16 172 lb (78 kg)      Other studies Reviewed: Additional studies/ records that were reviewed today include: Notes ER [facility] 3/18 Notes Dr Terrence Dupont and [facility] office Echo and myovue ECG and labs .    ASSESSMENT AND PLAN:  1. PAF:  Hold eliquis before cat 2 days. Then resume continue amiodarone given history of both PVCls And PAF will need event monitor to assess palpitations 2. HTN: stop norvasc given low EF Allergic to ACE start nitrates and consider hydralazine in future 3. Chest pain with history of low EF right and left cath next week. Risks including bleeding contrast allergy Stroke MI and need for emergency surgery Will try to arrange right heart from brachial and radial for cors 4. Cholesterol  On statin labs with primary  5. OSA wears CPAP contributes to ectopy and PAF  Spent over 40 minutes reviewing all old records from Dr Terrence Dupont and additional 20 minutes Educating her about PAF, Cardiomyopathy and PVC;s       Current medicines are reviewed at length with the patient today.  The  patient does not have concerns regarding medicines.  The following changes have been made:  Stop norvasc start imdur   Labs/ tests ordered today include: Pre cath labs Event monitor   No orders of the defined types were placed in this encounter.   Lab called cath on Tuesday with Dr Tamala Julian hold Eliquis Sunday /Monday  Orders written  Disposition:   FU with me post cath  Signed, Jenkins Rouge, MD  07/26/2016 4:03 PM    [facility] [place], [place], [place]  [address] Phone: [phone]; Fax: [phone]

## 2016-07-26 ENCOUNTER — Ambulatory Visit (INDEPENDENT_AMBULATORY_CARE_PROVIDER_SITE_OTHER): Payer: BLUE CROSS/BLUE SHIELD | Admitting: Cardiovascular Disease

## 2016-07-26 ENCOUNTER — Encounter: Payer: Self-pay | Admitting: Cardiovascular Disease

## 2016-07-26 VITALS — BP 102/60 | HR 55 | Ht 59.0 in | Wt 205.0 lb

## 2016-07-26 DIAGNOSIS — Z01818 Encounter for other preprocedural examination: Secondary | ICD-10-CM

## 2016-07-26 DIAGNOSIS — I4891 Unspecified atrial fibrillation: Secondary | ICD-10-CM | POA: Diagnosis not present

## 2016-07-26 MED ORDER — ISOSORBIDE MONONITRATE ER 30 MG PO TB24: 15.0000 mg | ORAL_TABLET | Freq: Every day | ORAL | 3 refills | Status: DC

## 2016-07-26 NOTE — Patient Instructions (Addendum)
Medication Instructions:  Your physician has recommended you make the following change in your medication:  1-STOP amlodipine  2-START IMDUR 15 mg (1/2 tablet) by mouth daily   Labwork: Your physician recommends that you have lab work today- BMET, CBC, and PT/INR  Testing/Procedures: Your physician has recommended that you wear an event monitor. Event monitors are medical devices that record the heart's electrical activity. Doctors most often [place] these monitors to diagnose arrhythmias. Arrhythmias are problems with the speed or rhythm of the heartbeat. The monitor is a small, portable device. You can wear one while you do your normal daily activities. This is usually used to diagnose what is causing palpitations/syncope (passing out).  Follow-Up: Your physician wants you to follow-up in: 2 with Sharon Cain or Sharon Cain   If you need a refill on your cardiac medications before your next appointment, please call your pharmacy.     [facility] OFFICE [address], [place] [address] [place] [address] Dept: [phone] Loc: [phone]  Sharon Cain  07/26/2016  You are scheduled for a Cardiac Catheterization on Tuesday, May 8 with Dr. Daneen Cain.  1. Please arrive at the [facility] (Main Entrance A) at [facility]: [place], [place] [address] at 10:00 AM (two hours before your procedure to ensure your preparation). Free valet parking service is available.   Special note: Every effort is made to have your procedure done on time. Please understand that emergencies sometimes delay scheduled procedures.  2. Diet: Do not eat or drink anything after midnight prior to your procedure except sips of water to take medications.  3. Labs: None needed.  4. Medication instructions in preparation for your procedure:    Current Outpatient Prescriptions (Cardiovascular):  .  amiodarone (PACERONE)  100 MG tablet, Take 100 mg by mouth daily. Marland Kitchen  amLODipine (NORVASC) 5 MG tablet, Take 5 mg by mouth daily.  Marland Kitchen  atorvastatin (LIPITOR) 40 MG tablet, Take 1 tablet (40 mg total) by mouth daily at 6 PM. .  carvedilol (COREG) 6.25 MG tablet, Take 6.25 mg by mouth 2 (two) times daily with a meal. .  furosemide (LASIX) 20 MG tablet, Take 20 mg by mouth daily.  Current Outpatient Prescriptions (Respiratory):  .  cetirizine (ZYRTEC) 10 MG tablet, Take 1 tablet (10 mg total) by mouth daily.   Current Outpatient Prescriptions (Hematological):  .  apixaban (ELIQUIS) 5 MG TABS tablet, Take 1 tablet (5 mg total) by mouth 2 (two) times daily.  Current Outpatient Prescriptions (Other):  .  busPIRone (BUSPAR) 10 MG tablet, TAKE ONE TABLET BY MOUTH THREE TIMES DAILY AS NEEDED (Patient taking differently: Take 10 mg by mouth three times a day as needed for anxiety) .  Omega-3 Fatty Acids (FISH OIL) 1000 MG CAPS, Take 2 capsules by mouth every morning.  .  Potassium 99 MG TABS, Take 1 tablet by mouth daily. *For reference purposes while preparing patient instructions.   Delete this med list prior to printing instructions for patient.*  Stop taking Eliquis (Apixiban) on Sunday, May 6.  HOLD lasix day of procedure  On the morning of your procedure, take your morning medicines NOT listed above.  You may use sips of water.  5. Plan for one night stay--bring personal belongings. 6. Bring a current list of your medications and current insurance cards. 7. You MUST have a responsible person to drive you home. 8. Someone MUST be with you the  first 24 hours after you arrive home or your discharge will be delayed. 9. Please wear clothes that are easy to get on and off and wear slip-on shoes.  Thank you for allowing [place] to care for you!   -- [facility]

## 2016-07-27 ENCOUNTER — Other Ambulatory Visit: Payer: Self-pay

## 2016-07-27 ENCOUNTER — Emergency Department (HOSPITAL_COMMUNITY)
Admission: EM | Admit: 2016-07-27 | Discharge: 2016-07-27 | Disposition: A | Discharge status: Home / Self Care | Payer: BLUE CROSS/BLUE SHIELD | Attending: Emergency Medicine | Admitting: Emergency Medicine

## 2016-07-27 ENCOUNTER — Encounter (HOSPITAL_COMMUNITY): Payer: Self-pay | Admitting: Emergency Medicine

## 2016-07-27 DIAGNOSIS — Z79899 Other long term (current) drug therapy: Secondary | ICD-10-CM | POA: Diagnosis not present

## 2016-07-27 DIAGNOSIS — R1010 Upper abdominal pain, unspecified: Secondary | ICD-10-CM | POA: Diagnosis not present

## 2016-07-27 DIAGNOSIS — I10 Essential (primary) hypertension: Secondary | ICD-10-CM | POA: Insufficient documentation

## 2016-07-27 DIAGNOSIS — Z7901 Long term (current) use of anticoagulants: Secondary | ICD-10-CM | POA: Diagnosis not present

## 2016-07-27 DIAGNOSIS — Z87891 Personal history of nicotine dependence: Secondary | ICD-10-CM | POA: Diagnosis not present

## 2016-07-27 LAB — CBC WITH DIFFERENTIAL/PLATELET
BASOS ABS: 0 10*3/uL (ref 0.0–0.2)
Basos: 1 %
EOS (ABSOLUTE): 0.2 10*3/uL (ref 0.0–0.4)
EOS: 3 %
HEMATOCRIT: 41.5 % (ref 34.0–46.6)
HEMOGLOBIN: 13.6 g/dL (ref 11.1–15.9)
IMMATURE GRANS (ABS): 0 10*3/uL (ref 0.0–0.1)
IMMATURE GRANULOCYTES: 0 %
LYMPHS: 35 %
Lymphocytes Absolute: 2.1 10*3/uL (ref 0.7–3.1)
MCH: 30.8 pg (ref 26.6–33.0)
MCHC: 32.8 g/dL (ref 31.5–35.7)
MCV: 94 fL (ref 79–97)
Monocytes Absolute: 0.6 10*3/uL (ref 0.1–0.9)
Monocytes: 11 %
NEUTROS PCT: 50 %
Neutrophils Absolute: 3.1 10*3/uL (ref 1.4–7.0)
Platelets: 183 10*3/uL (ref 150–379)
RBC: 4.42 x10E6/uL (ref 3.77–5.28)
RDW: 14 % (ref 12.3–15.4)
WBC: 6.1 10*3/uL (ref 3.4–10.8)

## 2016-07-27 LAB — COMPREHENSIVE METABOLIC PANEL
ALT: 45 U/L (ref 14–54)
ANION GAP: 12 (ref 5–15)
AST: 66 U/L — ABNORMAL HIGH (ref 15–41)
Albumin: 4.2 g/dL (ref 3.5–5.0)
Alkaline Phosphatase: 65 U/L (ref 38–126)
BUN: 16 mg/dL (ref 6–20)
CALCIUM: 9.4 mg/dL (ref 8.9–10.3)
CHLORIDE: 106 mmol/L (ref 101–111)
CO2: 20 mmol/L — AB (ref 22–32)
Creatinine, Ser: 0.58 mg/dL (ref 0.44–1.00)
GFR calc non Af Amer: >60 mL/min (ref >60)
Glucose, Bld: 129 mg/dL — ABNORMAL HIGH (ref 65–99)
POTASSIUM: 3.7 mmol/L (ref 3.5–5.1)
SODIUM: 138 mmol/L (ref 135–145)
Total Bilirubin: 1.5 mg/dL — ABNORMAL HIGH (ref 0.3–1.2)
Total Protein: 7.2 g/dL (ref 6.5–8.1)

## 2016-07-27 LAB — BASIC METABOLIC PANEL
BUN/Creatinine Ratio: 26 (ref 12–28)
BUN: 15 mg/dL (ref 8–27)
CALCIUM: 9.7 mg/dL (ref 8.7–10.3)
CO2: 21 mmol/L (ref 18–29)
CREATININE: 0.58 mg/dL (ref 0.57–1.00)
Chloride: 108 mmol/L — ABNORMAL HIGH (ref 96–106)
GFR calc Af Amer: 115 mL/min/{1.73_m2} (ref >59)
GFR, EST NON AFRICAN AMERICAN: 100 mL/min/{1.73_m2} (ref >59)
GLUCOSE: 124 mg/dL — AB (ref 65–99)
Potassium: 3.8 mmol/L (ref 3.5–5.2)
SODIUM: 145 mmol/L — AB (ref 134–144)

## 2016-07-27 LAB — LIPASE, BLOOD: LIPASE: 18 U/L (ref 11–51)

## 2016-07-27 LAB — CBC
HEMATOCRIT: 41.7 % (ref 36.0–46.0)
HEMOGLOBIN: 13.7 g/dL (ref 12.0–15.0)
MCH: 30.6 pg (ref 26.0–34.0)
MCHC: 32.9 g/dL (ref 30.0–36.0)
MCV: 93.3 fL (ref 78.0–100.0)
Platelets: 179 10*3/uL (ref 150–400)
RBC: 4.47 MIL/uL (ref 3.87–5.11)
RDW: 14.1 % (ref 11.5–15.5)
WBC: 7 10*3/uL (ref 4.0–10.5)

## 2016-07-27 LAB — I-STAT TROPONIN, ED: TROPONIN I, POC: 0.01 ng/mL (ref 0.00–0.08)

## 2016-07-27 LAB — PROTIME-INR
INR: 0.9 (ref 0.8–1.2)
PROTHROMBIN TIME: 10.1 s (ref 9.1–12.0)

## 2016-07-27 MED ORDER — ONDANSETRON HCL 4 MG/2ML IJ SOLN
INTRAMUSCULAR | Status: AC
  Filled 2016-07-27: qty 2

## 2016-07-27 MED ORDER — FENTANYL CITRATE (PF) 100 MCG/2ML IJ SOLN
50.0000 ug | INTRAMUSCULAR | Status: DC | PRN
  Administered 2016-07-27: 50 ug via INTRAVENOUS

## 2016-07-27 MED ORDER — HYDROCODONE-ACETAMINOPHEN 5-325 MG PO TABS: 2.0000 | ORAL_TABLET | Freq: Four times a day (QID) | ORAL | 0 refills | Status: DC | PRN

## 2016-07-27 MED ORDER — ONDANSETRON HCL 4 MG/2ML IJ SOLN
4.0000 mg | Freq: Once | INTRAMUSCULAR | Status: AC
  Administered 2016-07-27: 4 mg via INTRAVENOUS

## 2016-07-27 MED ORDER — ONDANSETRON 4 MG PO TBDP: 4.0000 mg | ORAL_TABLET | Freq: Once | ORAL | Status: DC | PRN

## 2016-07-27 MED ORDER — ONDANSETRON 4 MG PO TBDP: 4.0000 mg | ORAL_TABLET | Freq: Three times a day (TID) | ORAL | 0 refills | Status: DC | PRN

## 2016-07-27 MED ORDER — FENTANYL CITRATE (PF) 100 MCG/2ML IJ SOLN
INTRAMUSCULAR | Status: AC
  Filled 2016-07-27: qty 2

## 2016-07-27 NOTE — ED Notes (Signed)
Declined W/C at D/C and was escorted to lobby by RN. 

## 2016-07-27 NOTE — ED Provider Notes (Signed)
TIME SEEN: 5:44 AM  CHIEF COMPLAINT: Abdominal pain  HPI: Patient is a 61 year old female with history of hypertension, hyperlipidemia, atrial fibrillation, history of previous pancreatitis who presents emergency department diffuse sharp severe upper abdominal pain that woke her sleep around 12:30 AM tonight. Felt like her previous episodes of pancreatitis. Pain radiated into her back. Had associated nausea and chills but no vomiting. Did have diarrhea this morning that had resolved. No bloody stools or melena. No chest pain or shortness of breath. No fever. Patient is status post cholecystectomy.  ROS: See HPI Constitutional: no fever  Eyes: no drainage  ENT: no runny nose   Cardiovascular:  no chest pain  Resp: no SOB  GI: no vomiting GU: no dysuria Integumentary: no rash  Allergy: no hives  Musculoskeletal: no leg swelling  Neurological: no slurred speech ROS otherwise negative  PAST MEDICAL HISTORY/PAST SURGICAL HISTORY:  Past Medical History:  Diagnosis Date  . A-fib ([place]) 2017  . Anxiety   . Arthritis   . Chest pain, central 11/15/2010   Patient admitted to hospital for cardiac rule-out. No MI.    . Depression   . DEPRESSION 04/02/2007   Qualifier: Diagnosis of  By: Henderson Baltimore MD, Janett Billow    . Hyperlipidemia   . Hypertension   . Insomnia   . Obesity     MEDICATIONS:  Prior to Admission medications   Medication Sig Start Date End Date Taking? Authorizing Provider  amiodarone (PACERONE) 100 MG tablet Take 100 mg by mouth daily.    [provider]  apixaban (ELIQUIS) 5 MG TABS tablet Take 1 tablet (5 mg total) by mouth 2 (two) times daily. 12/11/15   Reyne Dumas, MD  atorvastatin (LIPITOR) 40 MG tablet Take 1 tablet (40 mg total) by mouth daily at 6 PM. 01/31/16   Mack Hook, MD  busPIRone (BUSPAR) 10 MG tablet TAKE ONE TABLET BY MOUTH THREE TIMES DAILY AS NEEDED Patient taking differently: Take 10 mg by mouth three times a day as needed for anxiety 01/02/15    Rosemarie Ax, MD  carvedilol (COREG) 6.25 MG tablet Take 6.25 mg by mouth 2 (two) times daily with a meal.    [provider]  cetirizine (ZYRTEC) 10 MG tablet Take 1 tablet (10 mg total) by mouth daily. 01/31/16   Mack Hook, MD  furosemide (LASIX) 20 MG tablet Take 20 mg by mouth daily.    [provider]  isosorbide mononitrate (IMDUR) 30 MG 24 hr tablet Take 0.5 tablets (15 mg total) by mouth daily. 07/26/16 10/24/16  Josue Hector, MD  Omega-3 Fatty Acids (FISH OIL) 1000 MG CAPS Take 2 capsules by mouth every morning.     [provider]    ALLERGIES:  Allergies  Allergen Reactions  . Sulfa Antibiotics     Pancreatitis  . Amlodipine Swelling  . Lisinopril     Other reaction(s): Other  . Ramipril     Other reaction(s): Other    SOCIAL HISTORY:  Social History  Substance Use Topics  . Smoking status: Former Smoker    Packs/day: 1.00    Types: Cigarettes    Quit date: 12/09/2015  . Smokeless tobacco: Never Used  . Alcohol use No    FAMILY HISTORY: Family History  Problem Relation Age of Onset  . Atrial fibrillation Mother   . Diabetes Mother   . Heart failure Mother   . COPD Mother   . COPD Father   . Congestive Heart Failure Father   .  Sudden Cardiac Death Father     EXAM: BP 139/84 (BP Location: Left Arm)   Pulse (!) 57   Temp 98.2 F (36.8 C) (Oral)   Resp (!) 24   SpO2 97%  CONSTITUTIONAL: Alert and oriented and responds appropriately to questions. Well-appearing; well-nourished HEAD: Normocephalic EYES: Conjunctivae clear, pupils appear equal, EOMI ENT: normal nose; moist mucous membranes NECK: Supple, no meningismus, no nuchal rigidity, no LAD  CARD: RRR; S1 and S2 appreciated; no murmurs, no clicks, no rubs, no gallops RESP: Normal chest excursion without splinting or tachypnea; breath sounds clear and equal bilaterally; no wheezes, no rhonchi, no rales, no hypoxia or respiratory distress, speaking full  sentences ABD/GI: Normal bowel sounds; non-distended; soft, non-tender, no rebound, no guarding, no peritoneal signs, no hepatosplenomegaly BACK:  The back appears normal and is non-tender to palpation, there is no CVA tenderness EXT: Normal ROM in all joints; non-tender to palpation; no edema; normal capillary refill; no cyanosis, no calf tenderness or swelling    SKIN: Normal color for age and race; warm; no rash NEURO: Moves all extremities equally PSYCH: The patient's mood and manner are appropriate. Grooming and personal hygiene are appropriate.  MEDICAL DECISION MAKING: Patient here with upper abdominal pain that woke her from sleep. States it felt similar to her prior episodes of pancreatitis. Patient given fentanyl in the waiting room and is now completely pain-free. Abdominal exam benign. AST mildly elevated as well as total bilirubin but lipase is normal. Patient is status post cholecystectomy. Patient and husband are ready for discharge home. He is concerned that this could be cardiac in nature and he states that she has a cardiac catheterization scheduled next week. My suspicion for ACS is low but will obtain troponin and EKG. Discussed with patient that this could have been an episode of mild pancreatitis that has not shown any changes in her lipases yet. Her last CT scan was separate 2018 that showed mild acute pancreatitis. I do not feel she needs a repeat CT scan today given her symptoms are controlled and she is hemodynamically stable. She is comfortable with this plan would be comfortable for discharge home with pain and nausea medication, liquid diet for the next 24-48 hours and slowly transition to a bland diet. She has a PCP for follow-up.  ED PROGRESS: Patient's troponin is negative. Still asymptomatic. EKG shows nonspecific T-wave changes diffusely similar to previous. Again suspect that this is possible early pancreatitis. Low suspicion for ACS. I feel she is safe to be discharged  home with prescription of Vicodin, Zofran and PCP follow-up. Patient has been comfortable with this plan.  At this time, I do not feel there is any life-threatening condition present. I have reviewed and discussed all results (EKG, imaging, lab, urine as appropriate) and exam findings with patient/family. I have reviewed nursing notes and appropriate previous records.  I feel the patient is safe to be discharged home without further emergent workup and can continue workup as an outpatient as needed. Discussed usual and customary return precautions. Patient/family verbalize understanding and are comfortable with this plan.  Outpatient follow-up has been provided if needed. All questions have been answered.      EKG Interpretation  Date/Time:  Saturday Jul 27 2016 06:34:07 EDT Ventricular Rate:  54 PR Interval:    QRS Duration: 121 QT Interval:  488 QTC Calculation: 463 R Axis:   38 Text Interpretation:  Sinus rhythm Left bundle branch block No significant change since last tracing Confirmed by WARD,  DO, KRISTEN ([id number]) on 07/27/2016 6:44:01 AM         Ward, Delice Bison, DO 07/27/16 [id number]

## 2016-07-27 NOTE — ED Notes (Signed)
Patient tearful, hyperventilating and moving around in chair, unable to get comfortable. Initiating acute pain protocol.

## 2016-07-29 ENCOUNTER — Ambulatory Visit (INDEPENDENT_AMBULATORY_CARE_PROVIDER_SITE_OTHER): Payer: BLUE CROSS/BLUE SHIELD

## 2016-07-29 ENCOUNTER — Telehealth: Payer: Self-pay | Admitting: Cardiovascular Disease

## 2016-07-29 ENCOUNTER — Other Ambulatory Visit: Payer: Self-pay | Admitting: Cardiovascular Disease

## 2016-07-29 DIAGNOSIS — I4891 Unspecified atrial fibrillation: Secondary | ICD-10-CM

## 2016-07-29 DIAGNOSIS — R079 Chest pain, unspecified: Secondary | ICD-10-CM

## 2016-07-29 MED ORDER — ISOSORBIDE MONONITRATE ER 30 MG PO TB24: 15.0000 mg | ORAL_TABLET | Freq: Every day | ORAL | 3 refills | Status: DC

## 2016-07-29 MED ORDER — CARVEDILOL 6.25 MG PO TABS: 6.2500 mg | ORAL_TABLET | Freq: Two times a day (BID) | ORAL | 3 refills | Status: DC

## 2016-07-29 NOTE — Telephone Encounter (Signed)
New Message   *STAT* If patient is at the pharmacy, call can be transferred to refill team.   1. Which medications need to be refilled? (please list name of each medication and dose if known)  caredilol 6.25 mg twice daily with meal furosemide 20 mg tablet once daily  2. Which pharmacy/location (including street and city if local pharmacy) is medication to be sent to? [facility] [phone], [address], [place], [place]  3. Do they need a 30 day or 90 day supply? 30 day supply  Please update pharmacy info to [facility] and not Health Department.Marland KitchenMarland KitchenMarland KitchenThanks

## 2016-07-29 NOTE — Telephone Encounter (Signed)
NEW MESSAGE   *STAT* If patient is at the pharmacy, call can be transferred to refill team.   1. Which medications need to be refilled? (please list name of each medication and dose if known) CARVIDILOL  2. Which pharmacy/location (including street and city if local pharmacy) is medication to be sent to? [facility] [place]   3. Do they need a 30 day or 90 day supply? 90DAY

## 2016-07-29 NOTE — Telephone Encounter (Signed)
Please add medication below to refills....  Imdur 30 mg tablet .5 once daily

## 2016-07-30 ENCOUNTER — Encounter (HOSPITAL_COMMUNITY)
Admission: RE | Disposition: A | Discharge status: Home / Self Care | Payer: Self-pay | Source: Ambulatory Visit | Attending: Interventional Cardiology

## 2016-07-30 ENCOUNTER — Ambulatory Visit (HOSPITAL_COMMUNITY)
Admission: RE | Admit: 2016-07-30 | Discharge: 2016-07-30 | Disposition: A | Discharge status: Home / Self Care | Payer: BLUE CROSS/BLUE SHIELD | Source: Ambulatory Visit | Attending: Interventional Cardiology | Admitting: Interventional Cardiology

## 2016-07-30 DIAGNOSIS — I10 Essential (primary) hypertension: Secondary | ICD-10-CM | POA: Diagnosis not present

## 2016-07-30 DIAGNOSIS — F329 Major depressive disorder, single episode, unspecified: Secondary | ICD-10-CM | POA: Insufficient documentation

## 2016-07-30 DIAGNOSIS — Z8249 Family history of ischemic heart disease and other diseases of the circulatory system: Secondary | ICD-10-CM | POA: Diagnosis not present

## 2016-07-30 DIAGNOSIS — E782 Mixed hyperlipidemia: Secondary | ICD-10-CM | POA: Diagnosis present

## 2016-07-30 DIAGNOSIS — Z87891 Personal history of nicotine dependence: Secondary | ICD-10-CM | POA: Diagnosis not present

## 2016-07-30 DIAGNOSIS — I428 Other cardiomyopathies: Secondary | ICD-10-CM | POA: Insufficient documentation

## 2016-07-30 DIAGNOSIS — Z6841 Body Mass Index (BMI) 40.0 and over, adult: Secondary | ICD-10-CM | POA: Diagnosis not present

## 2016-07-30 DIAGNOSIS — M1990 Unspecified osteoarthritis, unspecified site: Secondary | ICD-10-CM | POA: Diagnosis not present

## 2016-07-30 DIAGNOSIS — Z7901 Long term (current) use of anticoagulants: Secondary | ICD-10-CM | POA: Diagnosis not present

## 2016-07-30 DIAGNOSIS — I480 Paroxysmal atrial fibrillation: Secondary | ICD-10-CM | POA: Diagnosis not present

## 2016-07-30 DIAGNOSIS — I493 Ventricular premature depolarization: Secondary | ICD-10-CM | POA: Insufficient documentation

## 2016-07-30 DIAGNOSIS — R0789 Other chest pain: Secondary | ICD-10-CM | POA: Insufficient documentation

## 2016-07-30 DIAGNOSIS — E669 Obesity, unspecified: Secondary | ICD-10-CM | POA: Insufficient documentation

## 2016-07-30 DIAGNOSIS — E785 Hyperlipidemia, unspecified: Secondary | ICD-10-CM | POA: Diagnosis not present

## 2016-07-30 DIAGNOSIS — G4733 Obstructive sleep apnea (adult) (pediatric): Secondary | ICD-10-CM | POA: Diagnosis not present

## 2016-07-30 DIAGNOSIS — F419 Anxiety disorder, unspecified: Secondary | ICD-10-CM | POA: Insufficient documentation

## 2016-07-30 DIAGNOSIS — R079 Chest pain, unspecified: Secondary | ICD-10-CM | POA: Diagnosis not present

## 2016-07-30 DIAGNOSIS — G4700 Insomnia, unspecified: Secondary | ICD-10-CM | POA: Insufficient documentation

## 2016-07-30 DIAGNOSIS — I4891 Unspecified atrial fibrillation: Secondary | ICD-10-CM | POA: Diagnosis present

## 2016-07-30 DIAGNOSIS — I5022 Chronic systolic (congestive) heart failure: Secondary | ICD-10-CM | POA: Diagnosis present

## 2016-07-30 HISTORY — PX: RIGHT/LEFT HEART CATH AND CORONARY ANGIOGRAPHY: CATH118266

## 2016-07-30 LAB — POCT I-STAT 3, ART BLOOD GAS (G3+)
Acid-base deficit: 2 mmol/L (ref 0.0–2.0)
Bicarbonate: 23.2 mmol/L (ref 20.0–28.0)
O2 SAT: 88 %
PCO2 ART: 38.2 mmHg (ref 32.0–48.0)
TCO2: 24 mmol/L (ref 0–100)
pH, Arterial: 7.39 (ref 7.350–7.450)
pO2, Arterial: 55 mmHg — ABNORMAL LOW (ref 83.0–108.0)

## 2016-07-30 LAB — POCT I-STAT 3, VENOUS BLOOD GAS (G3P V)
ACID-BASE DEFICIT: 2 mmol/L (ref 0.0–2.0)
BICARBONATE: 23.5 mmol/L (ref 20.0–28.0)
O2 SAT: 62 %
TCO2: 25 mmol/L (ref 0–100)
pCO2, Ven: 40.1 mmHg — ABNORMAL LOW (ref 44.0–60.0)
pH, Ven: 7.376 (ref 7.250–7.430)
pO2, Ven: 33 mmHg (ref 32.0–45.0)

## 2016-07-30 SURGERY — RIGHT/LEFT HEART CATH AND CORONARY ANGIOGRAPHY
Anesthesia: LOCAL

## 2016-07-30 MED ORDER — HEPARIN (PORCINE) IN NACL 2-0.9 UNIT/ML-% IJ SOLN
INTRAMUSCULAR | Status: AC
  Filled 2016-07-30: qty 1000

## 2016-07-30 MED ORDER — ASPIRIN 81 MG PO CHEW
81.0000 mg | CHEWABLE_TABLET | ORAL | Status: AC
  Administered 2016-07-30: 81 mg via ORAL

## 2016-07-30 MED ORDER — FENTANYL CITRATE (PF) 100 MCG/2ML IJ SOLN
INTRAMUSCULAR | Status: DC | PRN
  Administered 2016-07-30: 50 ug via INTRAVENOUS

## 2016-07-30 MED ORDER — HEPARIN SODIUM (PORCINE) 1000 UNIT/ML IJ SOLN
INTRAMUSCULAR | Status: DC | PRN
Start: 2016-07-30 — End: 2016-07-30
  Administered 2016-07-30: 4500 [IU] via INTRAVENOUS

## 2016-07-30 MED ORDER — SODIUM CHLORIDE 0.9 % WEIGHT BASED INFUSION
3.0000 mL/kg/h | INTRAVENOUS | Status: DC
  Administered 2016-07-30: 3 mL/kg/h via INTRAVENOUS

## 2016-07-30 MED ORDER — SODIUM CHLORIDE 0.9% FLUSH: 3.0000 mL | Freq: Two times a day (BID) | INTRAVENOUS | Status: DC

## 2016-07-30 MED ORDER — LIDOCAINE HCL 1 % IJ SOLN
INTRAMUSCULAR | Status: AC
  Filled 2016-07-30: qty 20

## 2016-07-30 MED ORDER — SODIUM CHLORIDE 0.9% FLUSH: 3.0000 mL | INTRAVENOUS | Status: DC | PRN

## 2016-07-30 MED ORDER — HEPARIN (PORCINE) IN NACL 2-0.9 UNIT/ML-% IJ SOLN
INTRAMUSCULAR | Status: DC | PRN
  Administered 2016-07-30: 1000 mL

## 2016-07-30 MED ORDER — MIDAZOLAM HCL 2 MG/2ML IJ SOLN
INTRAMUSCULAR | Status: AC
  Filled 2016-07-30: qty 2

## 2016-07-30 MED ORDER — HEPARIN SODIUM (PORCINE) 1000 UNIT/ML IJ SOLN
INTRAMUSCULAR | Status: AC
  Filled 2016-07-30: qty 1

## 2016-07-30 MED ORDER — MIDAZOLAM HCL 2 MG/2ML IJ SOLN
INTRAMUSCULAR | Status: DC | PRN
Start: 2016-07-30 — End: 2016-07-30
  Administered 2016-07-30: 1 mg via INTRAVENOUS

## 2016-07-30 MED ORDER — SODIUM CHLORIDE 0.9 % WEIGHT BASED INFUSION: 1.0000 mL/kg/h | INTRAVENOUS | Status: DC

## 2016-07-30 MED ORDER — IOPAMIDOL (ISOVUE-370) INJECTION 76%
INTRAVENOUS | Status: DC | PRN
  Administered 2016-07-30: 70 mL via INTRA_ARTERIAL

## 2016-07-30 MED ORDER — ACETAMINOPHEN 325 MG PO TABS: 650.0000 mg | ORAL_TABLET | ORAL | Status: DC | PRN

## 2016-07-30 MED ORDER — SODIUM CHLORIDE 0.9 % IV SOLN: 250.0000 mL | INTRAVENOUS | Status: DC | PRN

## 2016-07-30 MED ORDER — ONDANSETRON HCL 4 MG/2ML IJ SOLN: 4.0000 mg | Freq: Four times a day (QID) | INTRAMUSCULAR | Status: DC | PRN

## 2016-07-30 MED ORDER — VERAPAMIL HCL 2.5 MG/ML IV SOLN
INTRAVENOUS | Status: DC | PRN
  Administered 2016-07-30: 15:00:00 via INTRA_ARTERIAL

## 2016-07-30 MED ORDER — LIDOCAINE HCL (PF) 1 % IJ SOLN
INTRAMUSCULAR | Status: DC | PRN
  Administered 2016-07-30 (×2): 2 mL

## 2016-07-30 MED ORDER — VERAPAMIL HCL 2.5 MG/ML IV SOLN
INTRAVENOUS | Status: AC
  Filled 2016-07-30: qty 2

## 2016-07-30 MED ORDER — FENTANYL CITRATE (PF) 100 MCG/2ML IJ SOLN
INTRAMUSCULAR | Status: AC
Start: 2016-07-30 — End: ?
  Filled 2016-07-30: qty 2

## 2016-07-30 MED ORDER — IOPAMIDOL (ISOVUE-370) INJECTION 76%
INTRAVENOUS | Status: AC
  Filled 2016-07-30: qty 100

## 2016-07-30 MED ORDER — APIXABAN 5 MG PO TABS
5.0000 mg | ORAL_TABLET | Freq: Two times a day (BID) | ORAL | Status: DC
  Filled 2016-07-30: qty 1

## 2016-07-30 MED ORDER — ASPIRIN 81 MG PO CHEW
CHEWABLE_TABLET | ORAL | Status: AC
  Administered 2016-07-30: 81 mg via ORAL
  Filled 2016-07-30: qty 1

## 2016-07-30 SURGICAL SUPPLY — 17 items
CATH BALLN WEDGE 5F 110CM (CATHETERS) ×1 IMPLANT
CATH INFINITI 5 FR JL3.5 (CATHETERS) ×1 IMPLANT
CATH INFINITI JR4 5F (CATHETERS) ×1 IMPLANT
CATH LAUNCHER 5F EBU3.0 (CATHETERS) IMPLANT
CATHETER LAUNCHER 5F EBU3.0 (CATHETERS) ×2
COVER PRB 48X5XTLSCP FOLD TPE (BAG) IMPLANT
COVER PROBE 5X48 (BAG) ×2
DEVICE RAD COMP TR BAND LRG (VASCULAR PRODUCTS) ×1 IMPLANT
GLIDESHEATH SLEND A-KIT 6F 22G (SHEATH) ×1 IMPLANT
GUIDEWIRE INQWIRE 1.5J.035X260 (WIRE) IMPLANT
INQWIRE 1.5J .035X260CM (WIRE) ×2
KIT HEART LEFT (KITS) ×2 IMPLANT
PACK CARDIAC CATHETERIZATION (CUSTOM PROCEDURE TRAY) ×2 IMPLANT
SHEATH GLIDE SLENDER 4/5FR (SHEATH) ×1 IMPLANT
TRANSDUCER W/STOPCOCK (MISCELLANEOUS) ×2 IMPLANT
TUBING CIL FLEX 10 FLL-RA (TUBING) ×2 IMPLANT
WIRE HI TORQ VERSACORE-J 145CM (WIRE) ×1 IMPLANT

## 2016-07-30 NOTE — H&P (View-Only) (Signed)
Cardiology Office Note   Date:  07/26/2016   ID:  Sharon Cain, DOB 14-Aug-1955, MRN [id number]  PCP:  Sharon Hatchet, MD  Cardiologist:   Sharon Rouge, MD   Chief Complaint  Patient presents with  . Atrial Fibrillation      History of Present Illness: Sharon Cain is a 61 y.o. female who presents for evaluation of symptomatic PVCls history of PAF, HTN. Referred by [facility] ER DR Sharon Cain. She has seen Dr Terrence Dupont recently and in past so not clear why she is referred here for Cardiology. ER visit March 17/18  with palpitations and atypical chest pain r/o PVCls K supplemented and d/c.  Has OSA on CPAP PAF diagnosed 11/2015 had acute pancreatitis October 2017 echo at that time noted EF 40-45%  Seen by [facility] September 2017 atypical chest pain and found to be in afib. Converted with iv amiodarone.  Myovue personally reviewed no ischemia or infarction EF noted 35% echo done same day EF 40-45% diffuse hypokinesis   I take care of her mother. She has very little insight into her cardiac issues. I tried to explain that she has had PAF and PVCls that account for palpitations She has had significant DCM with EF as low as 35% but no cath. She has significant anxiety which makes things worse. Some tightness in chest and dyspnea when she gets anxious   Past Medical History:  Diagnosis Date  . A-fib ([place]) 2017  . Anxiety   . Arthritis   . Chest pain, central 11/15/2010   Patient admitted to hospital for cardiac rule-out. No MI.    . Depression   . DEPRESSION 04/02/2007   Qualifier: Diagnosis of  By: Henderson Baltimore MD, Janett Billow    . Hyperlipidemia   . Hypertension   . Insomnia   . Obesity     Past Surgical History:  Procedure Laterality Date  . CARPAL TUNNEL RELEASE Bilateral    bilateral  . CHOLECYSTECTOMY     removed before the age of 53     Current Outpatient Prescriptions  Medication Sig Dispense Refill  . amiodarone (PACERONE) 100 MG tablet Take 100 mg by mouth daily.    Marland Kitchen  amLODipine (NORVASC) 5 MG tablet Take 5 mg by mouth daily.     Marland Kitchen apixaban (ELIQUIS) 5 MG TABS tablet Take 1 tablet (5 mg total) by mouth 2 (two) times daily. 60 tablet 3  . atorvastatin (LIPITOR) 40 MG tablet Take 1 tablet (40 mg total) by mouth daily at 6 PM. 30 tablet 11  . busPIRone (BUSPAR) 10 MG tablet TAKE ONE TABLET BY MOUTH THREE TIMES DAILY AS NEEDED (Patient taking differently: Take 10 mg by mouth three times a day as needed for anxiety) 30 tablet 0  . carvedilol (COREG) 6.25 MG tablet Take 6.25 mg by mouth 2 (two) times daily with a meal.    . cetirizine (ZYRTEC) 10 MG tablet Take 1 tablet (10 mg total) by mouth daily. 30 tablet 11  . furosemide (LASIX) 20 MG tablet Take 20 mg by mouth daily.    . Omega-3 Fatty Acids (FISH OIL) 1000 MG CAPS Take 2 capsules by mouth every morning.     . Potassium 99 MG TABS Take 1 tablet by mouth daily.     No current facility-administered medications for this visit.     Allergies:   Sulfa antibiotics; Lisinopril; and Ramipril    Social History:  The patient  reports that she quit smoking about 7 months ago.  Her smoking use included Cigarettes. She smoked 1.00 pack per day. She has never used smokeless tobacco. She reports that she does not drink alcohol or use drugs.   Family History:  [name] includes Atrial fibrillation in her mother; COPD in her father and mother; Congestive Heart Failure in her father; Diabetes in her mother; Heart failure in her mother; Sudden Cardiac Death in her father.    ROS:  Please see the history of present illness.   Otherwise, review of systems are positive for none.   All other systems are reviewed and negative.    PHYSICAL EXAM: VS:  BP 102/60   Pulse (!) 55   Ht 4\' 11"  (1.499 m)   Wt 205 lb (93 kg)   SpO2 99%   BMI 41.40 kg/m  , BMI Body mass index is 41.4 kg/m. Affect appropriate Obese white female  HEENT: normal Neck supple with no adenopathy JVP normal no bruits no  thyromegaly Lungs clear with no wheezing and good diaphragmatic motion Heart:  S1/S2 no murmur, no rub, gallop or click PMI normal Abdomen: benighn, BS positve, no tenderness, no AAA no bruit.  No HSM or HJR Distal pulses intact with no bruits No edema Neuro non-focal Skin warm and dry No muscular weakness    EKG: 3.17.18  SR rate 55 poor R wave progression    Recent Labs: 12/10/2015: Magnesium 2.1 01/31/2016: ALT 13; TSH 1.570 06/08/2016: BUN 18; Creatinine, Ser 0.66; Hemoglobin 13.6; Platelets 164; Potassium 3.7; Sodium 139    Lipid Panel    Component Value Date/Time   CHOL 105 01/14/2016 0908   TRIG 72 01/14/2016 0908   HDL 34 (L) 01/14/2016 0908   CHOLHDL 3.1 01/14/2016 0908   VLDL 14 01/14/2016 0908   LDLCALC 57 01/14/2016 0908   LDLDIRECT 149 (H) 02/27/2010 2015      Wt Readings from Last 3 Encounters:  07/26/16 205 lb (93 kg)  05/13/16 188 lb (85.3 kg)  01/31/16 172 lb (78 kg)      Other studies Reviewed: Additional studies/ records that were reviewed today include: Notes ER [facility] 3/18 Notes Dr Terrence Dupont and [facility] office Echo and myovue ECG and labs .    ASSESSMENT AND PLAN:  1. PAF:  Hold eliquis before cat 2 days. Then resume continue amiodarone given history of both PVCls And PAF will need event monitor to assess palpitations 2. HTN: stop norvasc given low EF Allergic to ACE start nitrates and consider hydralazine in future 3. Chest pain with history of low EF right and left cath next week. Risks including bleeding contrast allergy Stroke MI and need for emergency surgery Will try to arrange right heart from brachial and radial for cors 4. Cholesterol  On statin labs with primary  5. OSA wears CPAP contributes to ectopy and PAF  Spent over 40 minutes reviewing all old records from Dr Terrence Dupont and additional 20 minutes Educating her about PAF, Cardiomyopathy and PVC;s       Current medicines are reviewed at length with the patient today.  The  patient does not have concerns regarding medicines.  The following changes have been made:  Stop norvasc start imdur   Labs/ tests ordered today include: Pre cath labs Event monitor   No orders of the defined types were placed in this encounter.   Lab called cath on Tuesday with Dr Tamala Julian hold Eliquis Sunday /Monday  Orders written  Disposition:   FU with me post cath  Signed, Sharon Rouge, MD  07/26/2016 4:03 PM    [facility] [address], [place], [place]  [address] Phone: [phone]; Fax: [phone]

## 2016-07-30 NOTE — CV Procedure (Signed)
   Right and left heart cath via right arm..  Normal right heart pressures.   Widely patent coronary arteries.  Moderate reduction in LV function. Estimated EF 35-40%.

## 2016-07-30 NOTE — Discharge Instructions (Signed)
RESUME ELIQUIS TOMORROW AT 10AM   Radial Site Care Refer to this sheet in the next few weeks. These instructions provide you with information about caring for yourself after your procedure. Your health care provider may also give you more specific instructions. Your treatment has been planned according to current medical practices, but problems sometimes occur. Call your health care provider if you have any problems or questions after your procedure. What can I expect after the procedure? After your procedure, it is typical to have the following:  Bruising at the radial site that usually fades within 1-2 weeks.  Blood collecting in the tissue (hematoma) that may be painful to the touch. It should usually decrease in size and tenderness within 1-2 weeks. Follow these instructions at home:  Take medicines only as directed by your health care provider.  You may shower 24-48 hours after the procedure or as directed by your health care provider. Remove the bandage (dressing) and gently wash the site with plain soap and water. Pat the area dry with a clean towel. Do not rub the site, because this may cause bleeding.  Do not take baths, swim, or use a hot tub until your health care provider approves.  Check your insertion site every day for redness, swelling, or drainage.  Do not apply powder or lotion to the site.  Do not flex or bend the affected arm for 24 hours or as directed by your health care provider.  Do not push or pull heavy objects with the affected arm for 24 hours or as directed by your health care provider.  Do not lift over 10 lb (4.5 kg) for 5 days after your procedure or as directed by your health care provider.  Ask your health care provider when it is okay to:  Return to work or school.  Resume usual physical activities or sports.  Resume sexual activity.  Do not drive home if you are discharged the same day as the procedure. Have someone else drive you.  You may  drive 24 hours after the procedure unless otherwise instructed by your health care provider.  Do not operate machinery or power tools for 24 hours after the procedure.  If your procedure was done as an outpatient procedure, which means that you went home the same day as your procedure, a responsible adult should be with you for the first 24 hours after you arrive home.  Keep all follow-up visits as directed by your health care provider. This is important. Contact a health care provider if:  You have a fever.  You have chills.  You have increased bleeding from the radial site. Hold pressure on the site. Get help right away if:  You have unusual pain at the radial site.  You have redness, warmth, or swelling at the radial site.  You have drainage (other than a small amount of blood on the dressing) from the radial site.  The radial site is bleeding, and the bleeding does not stop after 30 minutes of holding steady pressure on the site.  Your arm or hand becomes pale, cool, tingly, or numb. This information is not intended to replace advice given to you by your health care provider. Make sure you discuss any questions you have with your health care provider. Document Released: 04/13/2010 Document Revised: 08/17/2015 Document Reviewed: 09/27/2013 Elsevier Interactive Patient Education  2017 [employer].

## 2016-07-30 NOTE — Interval H&P Note (Signed)
Cath Lab Visit (complete for each Cath Lab visit)  Clinical Evaluation Leading to the Procedure:   ACS: No.  Non-ACS:    Anginal Classification: CCS Cain  Anti-ischemic medical therapy: Minimal Therapy (1 class of medications)  Non-Invasive Test Results: No non-invasive testing performed  Prior CABG: No previous CABG      History and Physical Interval Note:  07/30/2016 2:51 PM  Sharon Cain  has presented today for surgery, with the diagnosis of cp  The various methods of treatment have been discussed with the patient and family. After consideration of risks, benefits and other options for treatment, the patient has consented to  Procedure(s): Right/Left Heart Cath and Coronary Angiography (N/A) as a surgical intervention .  The patient's history has been reviewed, patient examined, no change in status, stable for surgery.  I have reviewed the patient's chart and labs.  Questions were answered to the patient's satisfaction.     Sharon Cain

## 2016-07-31 ENCOUNTER — Encounter (HOSPITAL_COMMUNITY): Payer: Self-pay | Admitting: Interventional Cardiology

## 2016-08-01 ENCOUNTER — Encounter: Payer: Self-pay | Admitting: Cardiology

## 2016-08-05 ENCOUNTER — Encounter: Payer: Self-pay | Admitting: Cardiology

## 2016-08-05 ENCOUNTER — Ambulatory Visit (INDEPENDENT_AMBULATORY_CARE_PROVIDER_SITE_OTHER): Payer: BLUE CROSS/BLUE SHIELD | Admitting: Cardiology

## 2016-08-05 VITALS — BP 112/62 | HR 55 | Ht 59.0 in | Wt 203.4 lb

## 2016-08-05 DIAGNOSIS — I10 Essential (primary) hypertension: Secondary | ICD-10-CM

## 2016-08-05 DIAGNOSIS — E782 Mixed hyperlipidemia: Secondary | ICD-10-CM | POA: Diagnosis not present

## 2016-08-05 DIAGNOSIS — I4891 Unspecified atrial fibrillation: Secondary | ICD-10-CM

## 2016-08-05 DIAGNOSIS — R079 Chest pain, unspecified: Secondary | ICD-10-CM | POA: Diagnosis not present

## 2016-08-05 DIAGNOSIS — Z9989 Dependence on other enabling machines and devices: Secondary | ICD-10-CM

## 2016-08-05 DIAGNOSIS — G4733 Obstructive sleep apnea (adult) (pediatric): Secondary | ICD-10-CM

## 2016-08-05 NOTE — Progress Notes (Signed)
Cardiology Office Note   Date:  08/05/2016   ID:  Sharon Cain, DOB [birth year], MRN [id number]  PCP:  Sharon Hatchet, Cain  Cardiologist:  Dr. Johnsie Cancel    Chief Complaint  Patient presents with  . Cardiomyopathy      History of Present Illness: Sharon Cain is a 61 y.o. female who presents for NICM post cath.   She has a hx of symptomatic PVCs, hx of PAF, HTN, OSA on CPAP.  Also hx of acute pancreatitis in 2017 with echo and EF 40-45% at that time.  Hx of myoview without ischemia and EF 35%. On Echo same day EF 40-45% with diffuse hypokinesis.  She has hx on anxiety.     She was seen by Dr. Johnsie Cancel 07/26/16 after ER visit and chest pain.  With PAF Dr. Johnsie Cancel planned for cardiac cath.   She was then seen next day in ER for possible pancreatitis.  Troponin at that time was neg.  On Eliquis for PAF.  On last visit amlodipine stopped and nitrates started and thought to add hydralazine.    Cardiac cath 07/30/16  With normal coroanry arteries, EF 50-55% with segmental mid ant wall mild hypokinesis.  Normal PAP including PCWP.     Today she is stable, feels much better.  She was reassured about CAD, and her EF has improved by cath.  Legs are much improved off amlodipine and on imdur.  BP runs 542 to 706 systolic.  She ddi have some headaches.  She still has some SOB but I recommended she begin exercise 10 min a day in her pool or stationary bike, she does not use salt.  She is still wearing her event monitor.      Past Medical History:  Diagnosis Date  . A-fib (Sharon Cain) 2017  . Anxiety   . Arthritis   . Chest pain, central 11/15/2010   Patient admitted to hospital for cardiac rule-out. No MI.    . Depression   . DEPRESSION 04/02/2007   Qualifier: Diagnosis of  By: Henderson Sharon Cain, Sharon Cain    . Hyperlipidemia   . Hypertension   . Insomnia   . Obesity     Past Surgical History:  Procedure Laterality Date  . CARPAL TUNNEL RELEASE Bilateral    bilateral  . CHOLECYSTECTOMY     removed before  the age of 49  . RIGHT/LEFT HEART CATH AND CORONARY ANGIOGRAPHY N/A 07/30/2016   Procedure: Right/Left Heart Cath and Coronary Angiography;  Surgeon: Belva Crome, Cain;  Location: [facility];  Service: Cardiovascular;  Laterality: N/A;     Current Outpatient Prescriptions  Medication Sig Dispense Refill  . amiodarone (PACERONE) 100 MG tablet Take 100 mg by mouth daily.    Marland Kitchen apixaban (ELIQUIS) 5 MG TABS tablet Take 1 tablet (5 mg total) by mouth 2 (two) times daily. 60 tablet 3  . atorvastatin (LIPITOR) 40 MG tablet Take 1 tablet (40 mg total) by mouth daily at 6 PM. 30 tablet 11  . busPIRone (BUSPAR) 10 MG tablet Take 10 mg by mouth 3 (three) times daily as needed.    . carvedilol (COREG) 6.25 MG tablet Take 1 tablet (6.25 mg total) by mouth 2 (two) times daily with a meal. 180 tablet 3  . cetirizine (ZYRTEC) 10 MG tablet Take 1 tablet (10 mg total) by mouth daily. 30 tablet 11  . furosemide (LASIX) 20 MG tablet Take 20 mg by mouth daily.    Marland Kitchen HYDROcodone-acetaminophen (NORCO/VICODIN) 5-325  MG tablet Take 2 tablets by mouth every 6 (six) hours as needed. 16 tablet 0  . isosorbide mononitrate (IMDUR) 30 MG 24 hr tablet Take 0.5 tablets (15 mg total) by mouth daily. 45 tablet 3  . Omega-3 Fatty Acids (FISH OIL) 1000 MG CAPS Take 2 capsules by mouth every morning.     . ondansetron (ZOFRAN ODT) 4 MG disintegrating tablet Take 1 tablet (4 mg total) by mouth every 8 (eight) hours as needed for nausea or vomiting. 20 tablet 0   No current facility-administered medications for this visit.     Allergies:   Sulfa antibiotics; Amlodipine; Lisinopril; and Ramipril    Social History:  The patient  reports that she quit smoking about 7 months ago. Her smoking use included Cigarettes. She smoked 1.00 pack per day. She has never used smokeless tobacco. She reports that she does not drink alcohol or use drugs.   Family History:  [name] includes Atrial fibrillation in her  mother; COPD in her father and mother; Congestive Heart Failure in her father; Diabetes in her mother; Heart failure in her mother; Sudden Cardiac Death in her father.    ROS:  General:no colds or fevers,  weight down 2 lbs Skin:no rashes or ulcers HEENT:no blurred vision, no congestion CV:see HPI PUL:see HPI GI:no diarrhea constipation or melena, no indigestion GU:no hematuria, no dysuria MS:no joint pain, no claudication Neuro:no syncope, no lightheadedness Endo:no diabetes, no thyroid disease  Wt Readings from Last 3 Encounters:  08/05/16 203 lb 6.4 oz (92.3 kg)  07/30/16 205 lb (93 kg)  07/26/16 205 lb (93 kg)     PHYSICAL EXAM: VS:  BP 112/62   Pulse (!) 55   Ht 4\' 11"  (1.499 m)   Wt 203 lb 6.4 oz (92.3 kg)   BMI 41.08 kg/m  , BMI Body mass index is 41.08 kg/m. General:Pleasant affect, NAD Skin:Warm and dry, brisk capillary refill HEENT:normocephalic, sclera clear, mucus membranes moist Neck:supple, no JVD, no bruits  Heart:S1S2 RRR without murmur, gallup, rub or click Lungs:clear without rales, rhonchi, or wheezes MLY:YTKP, non tender, + BS, do not palpate liver spleen or masses Ext:no lower ext edema, 2+ pedal pulses, 2+ radial pulses Neuro:alert and oriented X 3, MAE, follows commands, + facial symmetry    EKG:  EKG is NOT ordered today.    Recent Labs: 12/10/2015: Magnesium 2.1 01/31/2016: TSH 1.570 07/27/2016: ALT 45; BUN 16; Creatinine, Ser 0.58; Hemoglobin 13.7; Platelets 179; Potassium 3.7; Sodium 138    Lipid Panel    Component Value Date/Time   CHOL 105 01/14/2016 0908   TRIG 72 01/14/2016 0908   HDL 34 (L) 01/14/2016 0908   CHOLHDL 3.1 01/14/2016 0908   VLDL 14 01/14/2016 0908   LDLCALC 57 01/14/2016 0908   LDLDIRECT 149 (H) 02/27/2010 2015       Other studies Reviewed: Additional studies/ records that were reviewed today include: . Cardiac cath 07/30/16 Procedures   Right/Left Heart Cath and Coronary Angiography  Conclusion    Normal  coronary arteries. Left dominant coronary anatomy.  Normal left ventricular systolic function. Segmental mid anterior wall mild hypokinesis. Estimated ejection fraction 50-55%.  Normal pulmonary artery pressures, including the pulmonary capillary wench.     Echo 11/2015 Study Conclusions  - Left ventricle: The cavity size was severely dilated. Systolic   function was mildly to moderately reduced. The estimated ejection   fraction was in the range of 40% to 45%. There is severe   hypokinesis of  the mid-apicallateral myocardium. There is   akinesis of the basal-midinferolateral and inferior myocardium.   There was an increased relative contribution of atrial   contraction to ventricular filling. Doppler parameters are   consistent with abnormal left ventricular relaxation (grade 1   diastolic dysfunction). - Atrial septum: There was increased thickness of the septum,   consistent with lipomatous hypertrophy.   ASSESSMENT AND PLAN:  1.  Chest pain now post cath with normal coronary arteries pt reassured about her chest pain, it has resolved currently. All question about cath were answered.  Heart muscle is stronger. She will follow up with Dr. Johnsie Cancel in 3-4 months.  2.  PAF maintaining SR on Eliquis we did hold for cath but has resumed. Also on amiodarone.  3.  HTN controlled on imdur, lasix and coreg, feels better off amlodipine.    4.  HLD per PCP has appt in 2 weeks.  5. OSA wear CPAP every night    Current medicines are reviewed with the patient today.  The patient Has no concerns regarding medicines.  The following changes have been made:  See above Labs/ tests ordered today include:see above  Disposition:   FU:  see above  Signed, Cecilie Kicks, NP  08/05/2016 1:50 PM    [facility] [place], [place], [place], [place] Phone: [phone]; Fax: [phone]

## 2016-08-05 NOTE — Patient Instructions (Signed)
Medication Instructions:  Your physician recommends that you continue on your current medications as directed. Please refer to the Current Medication list given to you today.    Labwork: -None  Testing/Procedures: -None  Follow-Up: Your physician recommends that you keep your scheduled  follow-up appointment with Dr. Johnsie Cancel.   Any Other Special Instructions Will Be Listed Below (If Applicable).     If you need a refill on your cardiac medications before your next appointment, please call your pharmacy.

## 2016-09-12 ENCOUNTER — Ambulatory Visit: Payer: Self-pay | Admitting: Neurology

## 2016-11-11 ENCOUNTER — Telehealth: Payer: Self-pay | Admitting: Cardiovascular Disease

## 2016-11-11 MED ORDER — AMIODARONE HCL 100 MG PO TABS
100.0000 mg | ORAL_TABLET | Freq: Every day | ORAL | 2 refills | Status: DC
Start: 2016-11-11 — End: 2017-06-17

## 2016-11-11 NOTE — Telephone Encounter (Signed)
New message     *STAT* If patient is at the pharmacy, call can be transferred to refill team.   1. Which medications need to be refilled? (please list name of each medication and dose if known) amiodarone 100 mg  2. Which pharmacy/location (including street and city if local pharmacy) is medication to be sent to? [place]   3. Do they need a 30 day or 90 day supply? 90 day

## 2016-11-11 NOTE — Telephone Encounter (Signed)
Medication refill sent to patient's pharmacy.  

## 2016-12-06 ENCOUNTER — Ambulatory Visit: Payer: BLUE CROSS/BLUE SHIELD | Admitting: Cardiovascular Disease

## 2016-12-16 NOTE — Progress Notes (Signed)
Cardiology Office Note   Date:  12/18/2016   ID:  Sharon Cain, DOB 1956/01/13, MRN [id number]  PCP:  Velna Hatchet, MD  Cardiologist:  Dr. Johnsie Cancel    No chief complaint on file.     History of Present Illness: Sharon Cain is a 61 y.o. female who presents for f/u DCM   She has a hx of symptomatic PVCs, hx of PAF, HTN, OSA on CPAP.  Also hx of acute pancreatitis in 2017 with echo and EF 40-45% at that time.  Hx of myoview without ischemia and EF 35%. On Echo same day EF 40-45% with diffuse hypokinesis.  She has hx on anxiety.     She was seen by me  07/26/16 after ER visit and chest pain.  With PAF   Cardiac cath 07/30/16  With normal coroanry arteries, EF 50-55% with segmental mid ant wall mild hypokinesis.  Normal PAP including PCWP.     Today she is stable, feels much better.  She was reassured about CAD, and her EF has improved by cath.  Legs are much improved off amlodipine and on imdur.  . Event monitor reviewed 07/29/16 and no PAF just isolated PVCls.    Past Medical History:  Diagnosis Date  . A-fib ([place]) 2017  . Anxiety   . Arthritis   . Chest pain, central 11/15/2010   Patient admitted to hospital for cardiac rule-out. No MI.    . Depression   . DEPRESSION 04/02/2007   Qualifier: Diagnosis of  By: Henderson Baltimore MD, Janett Billow    . Hyperlipidemia   . Hypertension   . Insomnia   . Obesity     Past Surgical History:  Procedure Laterality Date  . CARPAL TUNNEL RELEASE Bilateral    bilateral  . CHOLECYSTECTOMY     removed before the age of 49  . RIGHT/LEFT HEART CATH AND CORONARY ANGIOGRAPHY N/A 07/30/2016   Procedure: Right/Left Heart Cath and Coronary Angiography;  Surgeon: Belva Crome, MD;  Location: [facility];  Service: Cardiovascular;  Laterality: N/A;     Current Outpatient Prescriptions  Medication Sig Dispense Refill  . amiodarone (PACERONE) 100 MG tablet Take 1 tablet (100 mg total) by mouth daily. 90 tablet 2  . apixaban (ELIQUIS) 5 MG TABS  tablet Take 1 tablet (5 mg total) by mouth 2 (two) times daily. 60 tablet 3  . atorvastatin (LIPITOR) 40 MG tablet Take 1 tablet (40 mg total) by mouth daily at 6 PM. 30 tablet 11  . busPIRone (BUSPAR) 10 MG tablet Take 10 mg by mouth 3 (three) times daily as needed.    . carvedilol (COREG) 6.25 MG tablet Take 1 tablet (6.25 mg total) by mouth 2 (two) times daily with a meal. 180 tablet 3  . cetirizine (ZYRTEC) 10 MG tablet Take 1 tablet (10 mg total) by mouth daily. 30 tablet 11  . fluticasone (FLONASE) 50 MCG/ACT nasal spray Place 1 spray into both nostrils daily.  0  . furosemide (LASIX) 20 MG tablet Take 20 mg by mouth daily.    Marland Kitchen HYDROcodone-acetaminophen (NORCO/VICODIN) 5-325 MG tablet Take 2 tablets by mouth every 6 (six) hours as needed. 16 tablet 0  . Omega-3 Fatty Acids (FISH OIL) 1000 MG CAPS Take 2 capsules by mouth every morning.     . ondansetron (ZOFRAN ODT) 4 MG disintegrating tablet Take 1 tablet (4 mg total) by mouth every 8 (eight) hours as needed for nausea or vomiting. 20 tablet 0  . isosorbide  mononitrate (IMDUR) 30 MG 24 hr tablet Take 0.5 tablets (15 mg total) by mouth daily. 45 tablet 3   No current facility-administered medications for this visit.     Allergies:   Sulfa antibiotics; Amlodipine; Lisinopril; and Ramipril    Social History:  The patient  reports that she quit smoking about a year ago. Her smoking use included Cigarettes. She smoked 1.00 pack per day. She has never used smokeless tobacco. She reports that she does not drink alcohol or use drugs.   Family History:  [name] includes Atrial fibrillation in her mother; COPD in her father and mother; Congestive Heart Failure in her father; Diabetes in her mother; Heart failure in her mother; Sudden Cardiac Death in her father.    ROS:  General:no colds or fevers,  weight down 2 lbs Skin:no rashes or ulcers HEENT:no blurred vision, no congestion CV:see HPI PUL:see HPI GI:no diarrhea  constipation or melena, no indigestion GU:no hematuria, no dysuria MS:no joint pain, no claudication Neuro:no syncope, no lightheadedness Endo:no diabetes, no thyroid disease  Wt Readings from Last 3 Encounters:  12/18/16 207 lb 6.4 oz (94.1 kg)  08/05/16 203 lb 6.4 oz (92.3 kg)  07/30/16 205 lb (93 kg)     PHYSICAL EXAM: VS:  BP (!) 110/58   Pulse (!) 56   Ht 4\' 11"  (1.499 m)   Wt 207 lb 6.4 oz (94.1 kg)   SpO2 93%   BMI 41.89 kg/m  , BMI Body mass index is 41.89 kg/m. Affect appropriate Healthy:  appears stated age 29: normal Neck supple with no adenopathy JVP normal no bruits no thyromegaly Lungs clear with no wheezing and good diaphragmatic motion Heart:  S1/S2 no murmur, no rub, gallop or click PMI normal Abdomen: benighn, BS positve, no tenderness, no AAA no bruit.  No HSM or HJR Distal pulses intact with no bruits No edema Neuro non-focal Skin warm and dry No muscular weakness     EKG:  SR rate 54 LBBB     Recent Labs: 01/31/2016: TSH 1.570 07/27/2016: ALT 45; BUN 16; Creatinine, Ser 0.58; Hemoglobin 13.7; Platelets 179; Potassium 3.7; Sodium 138    Lipid Panel    Component Value Date/Time   CHOL 105 01/14/2016 0908   TRIG 72 01/14/2016 0908   HDL 34 (L) 01/14/2016 0908   CHOLHDL 3.1 01/14/2016 0908   VLDL 14 01/14/2016 0908   LDLCALC 57 01/14/2016 0908   LDLDIRECT 149 (H) 02/27/2010 2015       Other studies Reviewed: Additional studies/ records that were reviewed today include: . Cardiac cath 07/30/16 Procedures   Right/Left Heart Cath and Coronary Angiography  Conclusion    Normal coronary arteries. Left dominant coronary anatomy.  Normal left ventricular systolic function. Segmental mid anterior wall mild hypokinesis. Estimated ejection fraction 50-55%.  Normal pulmonary artery pressures, including the pulmonary capillary wench.     Echo 11/2015 Study Conclusions  - Left ventricle: The cavity size was severely dilated.  Systolic   function was mildly to moderately reduced. The estimated ejection   fraction was in the range of 40% to 45%. There is severe   hypokinesis of the mid-apicallateral myocardium. There is   akinesis of the basal-midinferolateral and inferior myocardium.   There was an increased relative contribution of atrial   contraction to ventricular filling. Doppler parameters are   consistent with abnormal left ventricular relaxation (grade 1   diastolic dysfunction). - Atrial septum: There was increased thickness of the septum,  consistent with lipomatous hypertrophy.   ASSESSMENT AND PLAN:  1.  Chest pain non cardiac cath 07/2016 no CAD   2.  PAF maintaining SR on Eliquis we did hold for cath but has resumed. Also on amiodarone.  3.  HTN controlled on imdur, lasix and coreg, feels better off amlodipine.    4.  HLD per PCP labs with primary   5. OSA wear CPAP every night    Jenkins Rouge

## 2016-12-18 ENCOUNTER — Ambulatory Visit (INDEPENDENT_AMBULATORY_CARE_PROVIDER_SITE_OTHER): Payer: BLUE CROSS/BLUE SHIELD | Admitting: Cardiovascular Disease

## 2016-12-18 ENCOUNTER — Encounter: Payer: Self-pay | Admitting: Cardiovascular Disease

## 2016-12-18 VITALS — BP 110/58 | HR 56 | Ht 59.0 in | Wt 207.4 lb

## 2016-12-18 DIAGNOSIS — Z79899 Other long term (current) drug therapy: Secondary | ICD-10-CM | POA: Diagnosis not present

## 2016-12-18 DIAGNOSIS — I4891 Unspecified atrial fibrillation: Secondary | ICD-10-CM

## 2016-12-18 NOTE — Patient Instructions (Addendum)
Medication Instructions:  Your physician recommends that you continue on your current medications as directed. Please refer to the Current Medication list given to you today.  Labwork: Your physician recommends that you return for lab work in: 6 months LFT and TSH   Testing/Procedures: Your physician has recommended that you have a pulmonary function test in 6 months. Pulmonary Function Tests are a group of tests that measure how well air moves in and out of your lungs.  Follow-Up: Your physician wants you to follow-up in: 6 months with Dr. Johnsie Cancel. You will receive a reminder letter in the mail two months in advance. If you don't receive a letter, please call our office to schedule the follow-up appointment.   If you need a refill on your cardiac medications before your next appointment, please call your pharmacy.

## 2017-01-14 ENCOUNTER — Telehealth: Payer: Self-pay | Admitting: Cardiovascular Disease

## 2017-01-14 ENCOUNTER — Other Ambulatory Visit: Payer: Self-pay | Admitting: Pharmacist

## 2017-01-14 MED ORDER — APIXABAN 5 MG PO TABS: 5.0000 mg | ORAL_TABLET | Freq: Two times a day (BID) | ORAL | 0 refills | Status: DC

## 2017-01-14 NOTE — Telephone Encounter (Signed)
New message    Patient request refill be sent [facility]. New script needed. Patient request a call back to confirm when sent.  *STAT* If patient is at the pharmacy, call can be transferred to refill team.   1. Which medications need to be refilled? (please list name of each medication and dose if known) apixaban (ELIQUIS) 5 MG TABS tablet  2. Which pharmacy/location (including street and city if local pharmacy) is medication to be sent to? [facility] fax [phone]  3. Do they need a 30 day or 90 day supply? [facility]

## 2017-01-14 NOTE — Telephone Encounter (Signed)
Apixaban 5mg  BID x 90 days sent to [facility] pharmacy as requested.   Talked to patient to confirm rx sent.

## 2017-04-22 ENCOUNTER — Telehealth: Payer: Self-pay | Admitting: Neurology

## 2017-04-22 ENCOUNTER — Other Ambulatory Visit: Payer: Self-pay | Admitting: Podiatry

## 2017-04-22 ENCOUNTER — Ambulatory Visit: Payer: BLUE CROSS/BLUE SHIELD

## 2017-04-22 ENCOUNTER — Ambulatory Visit: Payer: BLUE CROSS/BLUE SHIELD | Admitting: Podiatry

## 2017-04-22 ENCOUNTER — Ambulatory Visit (INDEPENDENT_AMBULATORY_CARE_PROVIDER_SITE_OTHER): Payer: BLUE CROSS/BLUE SHIELD

## 2017-04-22 DIAGNOSIS — M722 Plantar fascial fibromatosis: Secondary | ICD-10-CM | POA: Diagnosis not present

## 2017-04-22 MED ORDER — METHYLPREDNISOLONE 4 MG PO TBPK: ORAL_TABLET | ORAL | 0 refills | Status: DC

## 2017-04-22 MED ORDER — MELOXICAM 15 MG PO TABS: 15.0000 mg | ORAL_TABLET | Freq: Every day | ORAL | 3 refills | Status: DC

## 2017-04-22 NOTE — Telephone Encounter (Signed)
Pt called her husband says she is "huffing" when sleeping for the past couple of weeks. She has been using a full mask for the past month. Pt said she knows air is leaking, I advised her to call her DME and get fitted properly. She has agreed to this. Pt has appt on 4/30 with Hoyle Sauer, ok per [facility]

## 2017-04-22 NOTE — Patient Instructions (Signed)

## 2017-04-22 NOTE — Progress Notes (Signed)
   Subjective:    Patient ID: Sharon Cain, female    DOB: April 30, 1955, 62 y.o.   MRN: [id number]  HPI: Presents today with a chief complaint of bilateral heel pain times 6 months.  It seems that left is worse than the right.  Denies any trauma.  Has tried over-the-counter inserts naproxen and Epson salt soaks nothing seems to make it better.    Review of Systems  All other systems reviewed and are negative.      Objective:   Physical Exam: Vital signs are stable alert and oriented x3 no apparent distress.  Pulses are strongly palpable.  Neurologic sensorium is intact and symmetrical bilateral.  Deep tendon reflexes normal and symmetrical bilateral.  Muscle strength +5/5 dorsiflexors plantar flexors inverters everters all intrinsic musculature is intact.  Orthopedic evaluation demonstrates pain on palpation medial calcaneal tubercle of the bilateral heels left greater than right.  Radiographs taken today demonstrate a plantar distally oriented calcaneal heel spurs with no acute osseous injury.  She does demonstrate soft tissue increase in density at the plantar fascial calcaneal insertion site left greater than right.  Cutaneous evaluation demonstrates supple well-hydrated cutis normal texture normal turgor and warmth.        Assessment & Plan:  Plantar fasciitis times 6 months left greater than right.  Plan: Discussed etiology pathology conservative versus surgical therapies at this point after sterile Betadine skin prep and verbal consent we injected the bilateral heels today with 20 mg Kenalog 5 mg Marcaine to the medial aspect of the heel at the plantar fascial calcaneal insertion site.  She tolerated procedure well.  Put her into plantar fascial braces and a single night splint.  Start her on a Medrol Dosepak to be followed by meloxicam discussed appropriate shoe gear stretching exercises ice therapy as your modifications.  I will follow-up with her in 1 month.

## 2017-04-22 NOTE — Telephone Encounter (Signed)
I have reached out to [facility] to see if they can access her CPAP download information as I dont see her online. I would like to know how high of a leak she is having. I have asked [facility] to reach out to the patient in assisting her with her CPAP.

## 2017-04-24 ENCOUNTER — Other Ambulatory Visit: Payer: Self-pay | Admitting: Neurology

## 2017-04-24 DIAGNOSIS — G4733 Obstructive sleep apnea (adult) (pediatric): Secondary | ICD-10-CM

## 2017-04-29 ENCOUNTER — Other Ambulatory Visit: Payer: Self-pay | Admitting: Internal Medicine

## 2017-04-29 DIAGNOSIS — R911 Solitary pulmonary nodule: Secondary | ICD-10-CM

## 2017-05-20 ENCOUNTER — Ambulatory Visit: Payer: BLUE CROSS/BLUE SHIELD | Admitting: Podiatry

## 2017-05-20 ENCOUNTER — Encounter: Payer: Self-pay | Admitting: Podiatry

## 2017-05-20 ENCOUNTER — Other Ambulatory Visit: Payer: BLUE CROSS/BLUE SHIELD

## 2017-05-20 DIAGNOSIS — M722 Plantar fascial fibromatosis: Secondary | ICD-10-CM | POA: Diagnosis not present

## 2017-05-20 DIAGNOSIS — I4891 Unspecified atrial fibrillation: Secondary | ICD-10-CM

## 2017-05-20 DIAGNOSIS — Z79899 Other long term (current) drug therapy: Secondary | ICD-10-CM

## 2017-05-20 NOTE — Progress Notes (Signed)
She presents from today for follow-up of bilateral plantar fasciitis she states that they are doing better but when I work 8 hours there is still sore.  She states that the left one seems to be the worst point.  Objective: Vital signs are stable alert and oriented x3.  Pulses are palpable.  Neurologic sensorium is intact.  He has pain on palpation medial calcaneal tubercle of the left heel none on the right.  Assessment: Plantar fasciitis resolving right 50% resolved left.  Plan: Reinjected the left heel today after sterile Betadine skin prep 20 mg Kenalog 5 mg Marcaine point maximal tenderness left foot.  Tolerated procedure well without complications.

## 2017-05-21 LAB — HEPATIC FUNCTION PANEL
ALT: 20 IU/L (ref 0–32)
AST: 14 IU/L (ref 0–40)
Albumin: 4.4 g/dL (ref 3.6–4.8)
Alkaline Phosphatase: 67 IU/L (ref 39–117)
Bilirubin Total: 1.3 mg/dL — ABNORMAL HIGH (ref 0.0–1.2)
Bilirubin, Direct: 0.37 mg/dL (ref 0.00–0.40)
Total Protein: 6.3 g/dL (ref 6.0–8.5)

## 2017-05-21 LAB — TSH: TSH: 3.61 u[IU]/mL (ref 0.450–4.500)

## 2017-05-22 ENCOUNTER — Other Ambulatory Visit: Payer: Self-pay | Admitting: Internal Medicine

## 2017-05-22 DIAGNOSIS — R918 Other nonspecific abnormal finding of lung field: Secondary | ICD-10-CM

## 2017-05-23 ENCOUNTER — Other Ambulatory Visit: Payer: BLUE CROSS/BLUE SHIELD

## 2017-05-28 ENCOUNTER — Ambulatory Visit
Admission: RE | Admit: 2017-05-28 | Discharge: 2017-05-28 | Disposition: A | Discharge status: Home / Self Care | Payer: BLUE CROSS/BLUE SHIELD | Source: Ambulatory Visit | Attending: Internal Medicine | Admitting: Internal Medicine

## 2017-05-28 DIAGNOSIS — R918 Other nonspecific abnormal finding of lung field: Secondary | ICD-10-CM

## 2017-06-17 ENCOUNTER — Telehealth: Payer: Self-pay

## 2017-06-17 ENCOUNTER — Ambulatory Visit (INDEPENDENT_AMBULATORY_CARE_PROVIDER_SITE_OTHER): Payer: BLUE CROSS/BLUE SHIELD | Admitting: Internal Medicine

## 2017-06-17 DIAGNOSIS — I4891 Unspecified atrial fibrillation: Secondary | ICD-10-CM

## 2017-06-17 DIAGNOSIS — Z79899 Other long term (current) drug therapy: Secondary | ICD-10-CM

## 2017-06-17 LAB — PULMONARY FUNCTION TEST
DL/VA % pred: 108 %
DL/VA: 4.61 ml/min/mmHg/L
DLCO UNC % PRED: 87 %
DLCO UNC: 16.5 ml/min/mmHg
FEF 25-75 PRE: 1.94 L/s
FEF 25-75 Post: 1.86 L/sec
FEF2575-%Change-Post: -4 %
FEF2575-%Pred-Post: 92 %
FEF2575-%Pred-Pre: 96 %
FEV1-%Change-Post: -1 %
FEV1-%PRED-POST: 85 %
FEV1-%Pred-Pre: 86 %
FEV1-POST: 1.81 L
FEV1-Pre: 1.83 L
FEV1FVC-%Change-Post: 1 %
FEV1FVC-%Pred-Pre: 107 %
FEV6-%CHANGE-POST: -3 %
FEV6-%PRED-PRE: 83 %
FEV6-%Pred-Post: 80 %
FEV6-Post: 2.14 L
FEV6-Pre: 2.21 L
FEV6FVC-%PRED-POST: 104 %
FEV6FVC-%PRED-PRE: 104 %
FVC-%Change-Post: -3 %
FVC-%Pred-Post: 77 %
FVC-%Pred-Pre: 80 %
FVC-PRE: 2.21 L
FVC-Post: 2.14 L
POST FEV6/FVC RATIO: 100 %
PRE FEV1/FVC RATIO: 83 %
Post FEV1/FVC ratio: 84 %
Pre FEV6/FVC Ratio: 100 %
RV % pred: 73 %
RV: 1.36 L
TLC % PRED: 83 %
TLC: 3.72 L

## 2017-06-17 NOTE — Progress Notes (Signed)
PFT done today. 

## 2017-06-17 NOTE — Telephone Encounter (Signed)
Informed patient of Dr. Kyla Balzarine advisement. Patient verbalized understanding. Will update medication list.

## 2017-06-17 NOTE — Telephone Encounter (Signed)
-----   Message from Josue Hector, MD sent at 06/17/2017  3:23 PM EDT ----- Ok to stay off amiodarone for now

## 2017-06-20 ENCOUNTER — Telehealth: Payer: Self-pay | Admitting: Cardiovascular Disease

## 2017-06-20 NOTE — Telephone Encounter (Signed)
Pt states she has been having CP since last night.  CP has worsened throughout the day.  Does have slight SOB when she feels her heart fluttering.  No vitals available.  No nitro available.  Advised pt to go to ER for eval.  Pt verbalized understanding and was in agreement with this plan.

## 2017-06-20 NOTE — Telephone Encounter (Signed)
Patient c/o Palpitations:  High priority if patient c/o lightheadedness, shortness of breath, or chest pain  1) How long have you had palpitations/irregular HR/ Afib? Are you having the symptoms now? 05-22-2017   Yes   2) Are you currently experiencing lightheadedness, SOB or CP? no sob yes cp when it flutters  3) Do you have a history of afib (atrial fibrillation) or irregular heart rhythm? Yes  fluttering  4) Have you checked your BP or HR? (document readings if available):  no  Are you experiencing any other symptoms? Chest pains

## 2017-06-30 NOTE — Progress Notes (Signed)
Cardiology Office Note   Date:  07/08/2017   ID:  Sharon Cain, DOB 01-27-1956, MRN [id number]  PCP:  Velna Hatchet, MD  Cardiologist:  Dr. Johnsie Cancel    No chief complaint on file.     History of Present Illness:  62 y.o. with PAF, HTN, OSA on CPAP History of pancreatitis 2017 At that time noted LBBB and EF 40-45% by echo. Cath done 07/30/16 with no CAD and normal right heart pressures. Allergic to ACE and had leg pains with norvasc. EF at cath improved 50-55% Event Monitor done 07/29/16 with no recurrent PAF. Been maintained on beta blocker and eliquis   Chronic right foot pain ? Plantar fascitis but now worse in heel Has f/u with foot doctor     Past Medical History:  Diagnosis Date  . A-fib ([place]) 2017  . Anxiety   . Arthritis   . Chest pain, central 11/15/2010   Patient admitted to hospital for cardiac rule-out. No MI.    . Depression   . DEPRESSION 04/02/2007   Qualifier: Diagnosis of  By: Henderson Baltimore MD, Janett Billow    . Hyperlipidemia   . Hypertension   . Insomnia   . Obesity     Past Surgical History:  Procedure Laterality Date  . CARPAL TUNNEL RELEASE Bilateral    bilateral  . CHOLECYSTECTOMY     removed before the age of 66  . RIGHT/LEFT HEART CATH AND CORONARY ANGIOGRAPHY N/A 07/30/2016   Procedure: Right/Left Heart Cath and Coronary Angiography;  Surgeon: Belva Crome, MD;  Location: [facility];  Service: Cardiovascular;  Laterality: N/A;     Current Outpatient Medications  Medication Sig Dispense Refill  . apixaban (ELIQUIS) 5 MG TABS tablet Take 1 tablet (5 mg total) by mouth 2 (two) times daily. 180 tablet 0  . atorvastatin (LIPITOR) 80 MG tablet   8  . busPIRone (BUSPAR) 10 MG tablet Take 10 mg by mouth 3 (three) times daily as needed.    . carvedilol (COREG) 6.25 MG tablet Take 1 tablet (6.25 mg total) by mouth 2 (two) times daily with a meal. 180 tablet 3  . fexofenadine (ALLEGRA) 180 MG tablet Take 180 mg by mouth daily.    . fluticasone  (FLONASE) 50 MCG/ACT nasal spray Place 1 spray into both nostrils daily.  0  . furosemide (LASIX) 20 MG tablet Take 20 mg by mouth daily.    Marland Kitchen HYDROcodone-acetaminophen (NORCO/VICODIN) 5-325 MG tablet Take 2 tablets by mouth every 6 (six) hours as needed. 16 tablet 0  . isosorbide mononitrate (IMDUR) 30 MG 24 hr tablet Take 0.5 tablets (15 mg total) by mouth daily. 45 tablet 3  . meloxicam (MOBIC) 15 MG tablet Take 1 tablet (15 mg total) by mouth daily. 30 tablet 3  . ondansetron (ZOFRAN ODT) 4 MG disintegrating tablet Take 1 tablet (4 mg total) by mouth every 8 (eight) hours as needed for nausea or vomiting. 20 tablet 0  . zolpidem (AMBIEN) 5 MG tablet Take 5 mg by mouth at bedtime as needed.   0   No current facility-administered medications for this visit.     Allergies:   Sulfa antibiotics; Amlodipine; Lisinopril; and Ramipril    Social History:  The patient  reports that she quit smoking about 18 months ago. Her smoking use included cigarettes. She smoked 1.00 pack per day. She has never used smokeless tobacco. She reports that she does not drink alcohol or use drugs.   Family History:  The  patient's family history includes Atrial fibrillation in her mother; COPD in her father and mother; Congestive Heart Failure in her father; Diabetes in her mother; Heart failure in her mother; Sudden Cardiac Death in her father.    ROS:  General:no colds or fevers,  weight down 2 lbs Skin:no rashes or ulcers HEENT:no blurred vision, no congestion CV:see HPI PUL:see HPI GI:no diarrhea constipation or melena, no indigestion GU:no hematuria, no dysuria MS:no joint pain, no claudication Neuro:no syncope, no lightheadedness Endo:no diabetes, no thyroid disease  Wt Readings from Last 3 Encounters:  07/08/17 214 lb (97.1 kg)  12/18/16 207 lb 6.4 oz (94.1 kg)  08/05/16 203 lb 6.4 oz (92.3 kg)     PHYSICAL EXAM: VS:  BP 116/62   Pulse 61   Ht 4\' 11"  (1.499 m)   Wt 214 lb (97.1 kg)   SpO2 96%    BMI 43.22 kg/m  , BMI Body mass index is 43.22 kg/m. Affect appropriate Healthy:  appears stated age 12: normal Neck supple with no adenopathy JVP normal no bruits no thyromegaly Lungs clear with no wheezing and good diaphragmatic motion Heart:  S1/S2 split  no murmur, no rub, gallop or click PMI normal Abdomen: benighn, BS positve, no tenderness, no AAA no bruit.  No HSM or HJR Distal pulses intact with no bruits No edema Neuro non-focal Skin warm and dry No muscular weakness    EKG:  07/28/16  SR rate 54 LBBB    Recent Labs: 07/27/2016: BUN 16; Creatinine, Ser 0.58; Hemoglobin 13.7; Platelets 179; Potassium 3.7; Sodium 138 05/20/2017: ALT 20; TSH 3.610    Lipid Panel    Component Value Date/Time   CHOL 105 01/14/2016 0908   TRIG 72 01/14/2016 0908   HDL 34 (L) 01/14/2016 0908   CHOLHDL 3.1 01/14/2016 0908   VLDL 14 01/14/2016 0908   LDLCALC 57 01/14/2016 0908   LDLDIRECT 149 (H) 02/27/2010 2015       Other studies Reviewed: Additional studies/ records that were reviewed today include: . Cardiac cath 07/30/16 Procedures   Right/Left Heart Cath and Coronary Angiography  Conclusion    Normal coronary arteries. Left dominant coronary anatomy.  Normal left ventricular systolic function. Segmental mid anterior wall mild hypokinesis. Estimated ejection fraction 50-55%.  Normal pulmonary artery pressures, including the pulmonary capillary wench.     Echo 11/2015 Study Conclusions  - Left ventricle: The cavity size was severely dilated. Systolic   function was mildly to moderately reduced. The estimated ejection   fraction was in the range of 40% to 45%. There is severe   hypokinesis of the mid-apicallateral myocardium. There is   akinesis of the basal-midinferolateral and inferior myocardium.   There was an increased relative contribution of atrial   contraction to ventricular filling. Doppler parameters are   consistent with abnormal left ventricular  relaxation (grade 1   diastolic dysfunction). - Atrial septum: There was increased thickness of the septum,   consistent with lipomatous hypertrophy.   ASSESSMENT AND PLAN:  1.  Chest pain non cardiac cath 07/2016 no CAD   2.  PAF maintaining SR on Eliquis    3.  HTN controlled cannot use ACE or Norvasc    4.  HLD per PCP labs with primary LFTls normal 05/20/17   5. OSA wear CPAP every night normal right heart pressures 07/30/16   6. DCM/LBBB:  Stable no high grade AV block allergic to ACE continue nitrates beta blocker   7. Foot pain:  F/u podiatry limiting  mobility and did not improve with RX   Jenkins Rouge

## 2017-07-01 ENCOUNTER — Ambulatory Visit: Payer: BLUE CROSS/BLUE SHIELD | Admitting: Podiatry

## 2017-07-08 ENCOUNTER — Ambulatory Visit: Payer: BLUE CROSS/BLUE SHIELD | Admitting: Cardiovascular Disease

## 2017-07-08 ENCOUNTER — Encounter: Payer: Self-pay | Admitting: Cardiovascular Disease

## 2017-07-08 VITALS — BP 116/62 | HR 61 | Ht 59.0 in | Wt 214.0 lb

## 2017-07-08 DIAGNOSIS — I10 Essential (primary) hypertension: Secondary | ICD-10-CM

## 2017-07-08 DIAGNOSIS — I4891 Unspecified atrial fibrillation: Secondary | ICD-10-CM

## 2017-07-08 DIAGNOSIS — I420 Dilated cardiomyopathy: Secondary | ICD-10-CM

## 2017-07-08 DIAGNOSIS — E782 Mixed hyperlipidemia: Secondary | ICD-10-CM

## 2017-07-08 DIAGNOSIS — R079 Chest pain, unspecified: Secondary | ICD-10-CM

## 2017-07-08 NOTE — Patient Instructions (Addendum)

## 2017-07-21 ENCOUNTER — Encounter: Payer: Self-pay | Admitting: Nurse Practitioner

## 2017-07-21 NOTE — Progress Notes (Signed)
[facility]  PATIENT: Sharon Cain DOB: Jan 02, 1956   REASON FOR VISIT: Follow-up for obstructive sleep apnea CPAP compliance HISTORY FROM: Patient    HISTORY OF PRESENT ILLNESS: UPDATE 4/30/2019CM [name], 62 year old female returns for follow-up with history of obstructive sleep apnea here for CPAP compliance.  She reports that her mask is leaking and she has not returned to [facility] for a refit.  Her leak sometimes wakes her up.  Her mouth stays dry.  Compliance data dated 06/22/2017-07/21/2017 shows compliance greater than 4 hours at 100%.  Average usage 9 hours 47 minutes.  Set pressure 12 cm.  EPR level 3 leak 95th percentile 164.3.  AHI of 24.3 ESS 1.  She returns for reevaluation 2/19/19CDIrene B Cain is a 62 y.o. female , seen here as a referral/ revisit  from Dr. 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 .    [name] reports that she had a sleep study at the [facility] heart  and sleep center in 2012, and was issued a CPAP machine. I do not have her original sleep study available here, but the patient soon after he lost insurance and was for a while followed on [address] by Dr. Amil Amen. She has now regained insurance and is followed by Dr. Ardeth Perfect.  [name] reports that she  gained 40 pounds since atrial fib was diagnosed- in September 2017 . She  is no longer sleeping through the night, wakes up frequently and feels exhausted from tossing and turning. She started on amiodarone and  Chronic anticoagulation.   She has been compliant as a CPAP user and her ResMed S9 machine was brought to this appointment. We were able to obtain 100% compliance download for the last 30 days with an average user time of 8 hours and 58 minutes, CPAP is set at 12 cm water pressure with 3 cm EPR and her residual AHI is 3.1. The residual apneas consist mainly of obstructive events. She does have some moderate  air leaks. The patient endorsed a history of hypertension, depression, anxiety, hypercholesterolemia, atrial fibrillation, GERD, cholecystectomy in 2008 she quit smoking in September 2017 and has been successful.   She has not used any sleep aids.He is also followed by Dr. Everett Graff gynecology, Dr. Ardeth Perfect has seen her for cardiac issues, atypical chest pain. The patient also suffers from systolic chronic heart failure and is currently on Norvasc-amlodipine ramipril has been discontinued her left ventricular ejection fraction was 35%. Again she has been diagnosed with OSA and is using CPAP. She had pancreatitis after ramipril ( ?). Dr. Ardeth Perfect follows her with regular amylase, lipase,  thyroid tests and metabolic panels. Her TSH was high at 5.62 in January , the MCV was high at 108.73fL.  Sleep habits are as follows: Mrs. Rather usually goes to bed between 10 PM and midnight, usually she reads in bed before she falls asleep. She takes the [employer] over-the-counter sleep aids nightly, 25 mg Benadryl. Her physicians have attributed her leg pain to her weight gain. She takes extra strength Tylenol at night as well and this helps. She often wakes up after 2 or 3 hours of sleep and feels wide awake. She struggles than for an hour or more to go back to sleep. She shares a bedroom with her husband, the bedroom is cool, quiet and dark. He is also a CPAP user. She sleeps on her side. She sleeps on one pillow. There are  no pets in the bedroom. She may have one bathroom break if any. She rises at 7 AM, by alarm - her work starts at 8.30.  Rarely has she ever slept through the alarm. She denies waking up with headaches, discomfort, confusion, vertigo, again neither palpitation or diaphoresis. Her eyes can be dry -which could be related to a leak on CPAP.   REVIEW OF SYSTEMS: Full 14 system review of systems performed and notable only for those listed, all others are neg:  Constitutional: neg    Cardiovascular: neg Ear/Nose/Throat: neg  Skin: neg Eyes: neg Respiratory: neg Gastroitestinal: neg  Hematology/Lymphatic: neg  Endocrine: neg Musculoskeletal:neg Allergy/Immunology: Environmental allergies Neurological: neg Psychiatric: neg Sleep : Obstructive sleep apnea with CPAP, frequent waking   ALLERGIES: Allergies  Allergen Reactions  . Sulfa Antibiotics     Pancreatitis  . Amlodipine Swelling  . Lisinopril     Other reaction(s): Other  . Ramipril     Other reaction(s): Other    HOME MEDICATIONS: Outpatient Medications Prior to Visit  Medication Sig Dispense Refill  . apixaban (ELIQUIS) 5 MG TABS tablet Take 1 tablet (5 mg total) by mouth 2 (two) times daily. 180 tablet 0  . atorvastatin (LIPITOR) 80 MG tablet   8  . busPIRone (BUSPAR) 10 MG tablet Take 10 mg by mouth 3 (three) times daily as needed.    . carvedilol (COREG) 6.25 MG tablet Take 1 tablet (6.25 mg total) by mouth 2 (two) times daily with a meal. 180 tablet 3  . fexofenadine (ALLEGRA) 180 MG tablet Take 180 mg by mouth daily.    . fluticasone (FLONASE) 50 MCG/ACT nasal spray Place 1 spray into both nostrils daily.  0  . furosemide (LASIX) 20 MG tablet Take 20 mg by mouth daily.    . isosorbide mononitrate (IMDUR) 30 MG 24 hr tablet Take 0.5 tablets (15 mg total) by mouth daily. 45 tablet 3  . meloxicam (MOBIC) 15 MG tablet Take 1 tablet (15 mg total) by mouth daily. 30 tablet 3  . ondansetron (ZOFRAN ODT) 4 MG disintegrating tablet Take 1 tablet (4 mg total) by mouth every 8 (eight) hours as needed for nausea or vomiting. 20 tablet 0  . zolpidem (AMBIEN) 5 MG tablet Take 5 mg by mouth at bedtime as needed.   0  . HYDROcodone-acetaminophen (NORCO/VICODIN) 5-325 MG tablet Take 2 tablets by mouth every 6 (six) hours as needed. 16 tablet 0   No facility-administered medications prior to visit.     PAST MEDICAL HISTORY: Past Medical History:  Diagnosis Date  . A-fib ([place]) 2017  . Anxiety   .  Arthritis   . Chest pain, central 11/15/2010   Patient admitted to hospital for cardiac rule-out. No MI.    . Depression   . DEPRESSION 04/02/2007   Qualifier: Diagnosis of  By: Henderson Baltimore MD, Janett Billow    . Hyperlipidemia   . Hypertension   . Insomnia   . Obesity     PAST SURGICAL HISTORY: Past Surgical History:  Procedure Laterality Date  . CARPAL TUNNEL RELEASE Bilateral    bilateral  . CHOLECYSTECTOMY     removed before the age of 14  . RIGHT/LEFT HEART CATH AND CORONARY ANGIOGRAPHY N/A 07/30/2016   Procedure: Right/Left Heart Cath and Coronary Angiography;  Surgeon: Belva Crome, MD;  Location: [facility];  Service: Cardiovascular;  Laterality: N/A;    FAMILY HISTORY: Family History  Problem Relation Age of Onset  . Atrial fibrillation Mother   .  Diabetes Mother   . Heart failure Mother   . COPD Mother   . COPD Father   . Congestive Heart Failure Father   . Sudden Cardiac Death Father     SOCIAL HISTORY: Social History   Socioeconomic History  . Marital status: Married    Spouse name: Not on file  . Number of children: Not on file  . Years of education: Not on file  . Highest education level: Not on file  Occupational History  . Not on file  Social Needs  . Financial resource strain: Not on file  . Food insecurity:    Worry: Not on file    Inability: Not on file  . Transportation needs:    Medical: Not on file    Non-medical: Not on file  Tobacco Use  . Smoking status: Former Smoker    Packs/day: 1.00    Types: Cigarettes    Last attempt to quit: 12/09/2015    Years since quitting: 1.6  . Smokeless tobacco: Never Used  Substance and Sexual Activity  . Alcohol use: No    Alcohol/week: 0.0 oz  . Drug use: No  . Sexual activity: Not on file  Lifestyle  . Physical activity:    Days per week: Not on file    Minutes per session: Not on file  . Stress: Not on file  Relationships  . Social connections:    Talks on phone: Not on file    Gets  together: Not on file    Attends religious service: Not on file    Active member of club or organization: Not on file    Attends meetings of clubs or organizations: Not on file    Relationship status: Not on file  . Intimate partner violence:    Fear of current or ex partner: Not on file    Emotionally abused: Not on file    Physically abused: Not on file    Forced sexual activity: Not on file  Other Topics Concern  . Not on file  Social History Narrative  . Not on file     PHYSICAL EXAM  Vitals:   07/22/17 1252  BP: 104/68  Pulse: 60  Weight: 211 lb 3.2 oz (95.8 kg)  Height: 4\' 11"  (1.499 m)   Body mass index is 42.66 kg/m.  Generalized: Well developed, morbidly obese female in no acute distress  Head: normocephalic and atraumatic,. Oropharynx benign malopatti 4.  Neck: Supple, circumference 15  Musculoskeletal: No deformity   Neurological examination   Mentation: Alert oriented to time, place, history taking. Attention span and concentration appropriate. Recent and remote memory intact.  Follows all commands speech and language fluent.   Cranial nerve II-XII: Pupils were equal round reactive to light extraocular movements were full, visual field were full on confrontational test. Facial sensation and strength were normal. hearing was intact to finger rubbing bilaterally. Uvula tongue midline. head turning and shoulder shrug were normal and symmetric.Tongue protrusion into cheek strength was normal. Motor: normal bulk and tone, full strength in the BUE, BLE, Sensory: normal and symmetric to light touch,  Coordination: finger-nose-finger, heel-to-shin bilaterally, no dysmetria Gait and Station: Rising up from seated position without assistance, normal stance,  moderate stride, good arm swing, smooth turning, able to perform tiptoe, and heel walking without difficulty. Tandem gait is steady  DIAGNOSTIC DATA (LABS, IMAGING, TESTING) - I reviewed patient records, labs,  notes, testing and imaging myself where available.  Lab Results  Component Value Date  WBC 7.0 07/27/2016   HGB 13.7 07/27/2016   HCT 41.7 07/27/2016   MCV 93.3 07/27/2016   PLT 179 07/27/2016      Component Value Date/Time   NA 138 07/27/2016 0239   NA 145 (H) 07/26/2016 1620   K 3.7 07/27/2016 0239   CL 106 07/27/2016 0239   CO2 20 (L) 07/27/2016 0239   GLUCOSE 129 (H) 07/27/2016 0239   BUN 16 07/27/2016 0239   BUN 15 07/26/2016 1620   CREATININE 0.58 07/27/2016 0239   CREATININE 0.62 05/20/2012 0841   CALCIUM 9.4 07/27/2016 0239   PROT 6.3 05/20/2017 1606   ALBUMIN 4.4 05/20/2017 1606   AST 14 05/20/2017 1606   ALT 20 05/20/2017 1606   ALKPHOS 67 05/20/2017 1606   BILITOT 1.3 (H) 05/20/2017 1606   GFRNONAA >60 07/27/2016 0239   GFRAA >60 07/27/2016 0239   Lab Results  Component Value Date   CHOL 105 01/14/2016   HDL 34 (L) 01/14/2016   LDLCALC 57 01/14/2016   LDLDIRECT 149 (H) 02/27/2010   TRIG 72 01/14/2016   CHOLHDL 3.1 01/14/2016   Lab Results  Component Value Date   HGBA1C 5.3 06/04/2013   No results found for: [id number] Lab Results  Component Value Date   TSH 3.610 05/20/2017      ASSESSMENT AND PLAN  62 y.o. year old female  has a past medical history of A-fib ([place]) (2017), Anxiety, Arthritis, Chest pain, central (11/15/2010), Depression, DEPRESSION (04/02/2007), Hyperlipidemia, Hypertension, Insomnia, and Obesity. here to follow-up for obstructive sleep apnea with CPAP compliance. Data dated 06/22/2017-07/21/2017 shows compliance greater than 4 hours at 100%.  Average usage 9 hours 47 minutes.  Set pressure 12 cm.  EPR level 3 leak 95th percentile 164.3.  AHI of 24.3 ESS 1.  CPAP compliance 100% Patient has significant leak and needs a mask refitting Follow-up in 3 months for repeat compliance Dennie Bible, [facility], [facility], APRN  [facility] [address] [place], [place] [address] [phone]

## 2017-07-22 ENCOUNTER — Ambulatory Visit (INDEPENDENT_AMBULATORY_CARE_PROVIDER_SITE_OTHER): Payer: BLUE CROSS/BLUE SHIELD | Admitting: Nurse Practitioner

## 2017-07-22 ENCOUNTER — Encounter: Payer: Self-pay | Admitting: Nurse Practitioner

## 2017-07-22 DIAGNOSIS — Z9989 Dependence on other enabling machines and devices: Secondary | ICD-10-CM

## 2017-07-22 DIAGNOSIS — G4733 Obstructive sleep apnea (adult) (pediatric): Secondary | ICD-10-CM

## 2017-07-22 NOTE — Patient Instructions (Signed)
CPAP compliance 100% Patient has significant leak and needs a mask refitting Follow-up in 3 months for repeat compliance

## 2017-07-24 ENCOUNTER — Ambulatory Visit: Payer: Self-pay | Admitting: Podiatry

## 2017-07-27 NOTE — Progress Notes (Signed)
I agree with the assessment and plan as directed by NP .The patient is known to me .   Tanylah Schnoebelen, MD  

## 2017-07-30 ENCOUNTER — Other Ambulatory Visit: Payer: Self-pay | Admitting: Cardiovascular Disease

## 2017-08-05 ENCOUNTER — Encounter: Payer: Self-pay | Admitting: Podiatry

## 2017-08-05 ENCOUNTER — Other Ambulatory Visit: Payer: Self-pay | Admitting: *Deleted

## 2017-08-05 ENCOUNTER — Encounter

## 2017-08-05 ENCOUNTER — Ambulatory Visit: Payer: BLUE CROSS/BLUE SHIELD | Admitting: Podiatry

## 2017-08-05 DIAGNOSIS — M722 Plantar fascial fibromatosis: Secondary | ICD-10-CM

## 2017-08-05 MED ORDER — TRAMADOL HCL 50 MG PO TABS: 50.0000 mg | ORAL_TABLET | Freq: Three times a day (TID) | ORAL | 0 refills | Status: AC | PRN

## 2017-08-05 MED ORDER — APIXABAN 5 MG PO TABS: 5.0000 mg | ORAL_TABLET | Freq: Two times a day (BID) | ORAL | 3 refills | Status: DC

## 2017-08-05 NOTE — Telephone Encounter (Signed)
Pt is a 62 yr old female who last saw Dr. Johnsie Cancel on 07/08/17.  Weight on 07/22/17 was 95.8Kg. Serum creatine on 0.7 from 04/04/17 from [facility]. Will refill Eliquis 5mg  BID.

## 2017-08-05 NOTE — Progress Notes (Signed)
She presents today states that she is following of her bilateral heels.  She says the right one is some better but the left was absolutely killing me.  She states that seems to be going the opposite direction.  Injections really hurt it plantar fascial brace bothers her in the night boot there is absolutely kills me.  Objective: Vital signs are stable she is alert and oriented x3.  There is no erythema edema sialitis drainage or other severe pain on palpation medial calcaneal tubercles bilateral left greater than right.  There appears to be avoided in the plantar fashion along the medial band possibly a tear in the plantar fascia.  Assessment: Plantar fasciitis possible tear the plantar fascial left.  Plan: Requesting MRI of the left rear foot at this point time to evaluate for surgical intervention.

## 2017-08-06 ENCOUNTER — Telehealth: Payer: Self-pay | Admitting: *Deleted

## 2017-08-06 DIAGNOSIS — M79672 Pain in left foot: Secondary | ICD-10-CM

## 2017-08-06 NOTE — Telephone Encounter (Signed)
-----   Message from Echo sent at 08/05/2017  4:26 PM EDT ----- Regarding: MRI MRI rearfoot left - evaluate plantar fascial tear left

## 2017-08-14 ENCOUNTER — Ambulatory Visit
Admission: RE | Admit: 2017-08-14 | Discharge: 2017-08-14 | Disposition: A | Discharge status: Home / Self Care | Payer: BLUE CROSS/BLUE SHIELD | Source: Ambulatory Visit | Attending: Podiatry | Admitting: Podiatry

## 2017-08-14 ENCOUNTER — Other Ambulatory Visit: Payer: BLUE CROSS/BLUE SHIELD

## 2017-08-20 NOTE — Telephone Encounter (Signed)
Called patient and she doesn't know her schedule yet to schedule an appointment with Dr Milinda Pointer. Patient stated she would call us back when she knows a date she can come

## 2017-09-02 ENCOUNTER — Ambulatory Visit: Payer: BLUE CROSS/BLUE SHIELD | Admitting: Podiatry

## 2017-09-02 DIAGNOSIS — M722 Plantar fascial fibromatosis: Secondary | ICD-10-CM | POA: Diagnosis not present

## 2017-09-02 NOTE — Patient Instructions (Signed)
Pre-Operative Instructions  Congratulations, you have decided to take an important step towards improving your quality of life.  You can be assured that the doctors and staff at [facility] will be with you every step of the way.  Here are some important things you should know:  1. Plan to be at the surgery center/hospital at least 1 (one) hour prior to your scheduled time, unless otherwise directed by the surgical center/hospital staff.  You must have a responsible adult accompany you, remain during the surgery and drive you home.  Make sure you have directions to the surgical center/hospital to ensure you arrive on time. 2. If you are having surgery at [facility] or [facility], you will need a copy of your medical history and physical form from your family physician within one month prior to the date of surgery. We will give you a form for your primary physician to complete.  3. We make every effort to accommodate the date you request for surgery.  However, there are times where surgery dates or times have to be moved.  We will contact you as soon as possible if a change in schedule is required.   4. No aspirin/ibuprofen for one week before surgery.  If you are on aspirin, any non-steroidal anti-inflammatory medications (Mobic, Aleve, Ibuprofen) should not be taken seven (7) days prior to your surgery.  You make take Tylenol for pain prior to surgery.  5. Medications - If you are taking daily heart and blood pressure medications, seizure, reflux, allergy, asthma, anxiety, pain or diabetes medications, make sure you notify the surgery center/hospital before the day of surgery so they can tell you which medications you should take or avoid the day of surgery. 6. No food or drink after midnight the night before surgery unless directed otherwise by surgical center/hospital staff. 7. No alcoholic beverages 24-hours prior to surgery.  No smoking 24-hours prior or 24-hours after  surgery. 8. Wear loose pants or shorts. They should be loose enough to fit over bandages, boots, and casts. 9. Don't wear slip-on shoes. Sneakers are preferred. 10. Bring your boot with you to the surgery center/hospital.  Also bring crutches or a walker if your physician has prescribed it for you.  If you do not have this equipment, it will be provided for you after surgery. 11. If you have not been contacted by the surgery center/hospital by the day before your surgery, call to confirm the date and time of your surgery. 12. Leave-time from work may vary depending on the type of surgery you have.  Appropriate arrangements should be made prior to surgery with your employer. 13. Prescriptions will be provided immediately following surgery by your doctor.  Fill these as soon as possible after surgery and take the medication as directed. Pain medications will not be refilled on weekends and must be approved by the doctor. 14. Remove nail polish on the operative foot and avoid getting pedicures prior to surgery. 15. Wash the night before surgery.  The night before surgery wash the foot and leg well with water and the antibacterial soap provided. Be sure to pay special attention to beneath the toenails and in between the toes.  Wash for at least three (3) minutes. Rinse thoroughly with water and dry well with a towel.  Perform this wash unless told not to do so by your physician.  Enclosed: 1 Ice pack (please put in freezer the night before surgery)   1 Hibiclens skin cleaner     Pre-op instructions  If you have any questions regarding the instructions, please do not hesitate to call our office.  [place]: [address], [place], [place] [address] -- [phone]  [place]: [address] , [place] [address] -- [phone]  [place]: [address]  [place], [place] [address] -- [phone]  [place]: [address], [address], [place], [place] [address] -- [phone]  Website:  [email or web address] 

## 2017-09-02 NOTE — Progress Notes (Signed)
She presents today for follow-up of her MRI regarding her letter fasciitis to her left foot.  She states that is been hurting and has not let up at all.  Objective: Vital signs are stable alert and oriented x3.  Pulses are palpable.  Still has pain on palpation plantar aspect of the left foot.  MRI states that she has a tear to the medial band and the lateral band of the plantar fascia each 1 of these tears or partial tears central band is intact with surrounding fasciitis.  Assessment: Plantar fascial tears left foot.  Plan: Discussed surgical intervention today consented her for an endoscopic plantar fasciotomy total in nature left foot.  We did discuss the possible postop complications which may include but are not limited to postop pain bleeding swelling infection recurrence need further surgery overcorrection under correction loss of digit loss of limb loss of life.  She understands that it would be at least 2 weeks before should be able return back to work utilizing a Cam walker for at least those 2 weeks and possibly longer while she was at work.  She signed all 3 pages a consent form we will follow-up with her in the near future for surgical consideration.

## 2017-09-12 ENCOUNTER — Telehealth: Payer: Self-pay | Admitting: *Deleted

## 2017-09-12 NOTE — Telephone Encounter (Signed)
"  I'm scheduled for surgery with Dr. Milinda Pointer.  I don't know where I'm supposed to go.  I need to know the name of my procedure so I can know what my co-pay is going to be."  Your surgery is going to be done at [facility].  We gave you a brochure in your bag.  The address is on the back of the brochure.  I will ask Jocelyn Lamer in our insurance Department to give you a call with an estimate.  You are having a total Endoscopic Plantar Fasciotomy of the left foot.  "I don't have my bag with me I'll look at it when I get home."

## 2017-10-02 ENCOUNTER — Other Ambulatory Visit: Payer: Self-pay | Admitting: Podiatry

## 2017-10-02 ENCOUNTER — Telehealth: Payer: Self-pay | Admitting: *Deleted

## 2017-10-02 NOTE — Telephone Encounter (Signed)
Faxed rx for Tramadol to [facility] [phone]

## 2017-10-07 ENCOUNTER — Telehealth: Payer: Self-pay | Admitting: *Deleted

## 2017-10-07 NOTE — Telephone Encounter (Signed)
"  I need to reschedule my surgery.  I'm scheduled for surgery on August 2.  I'd like to reschedule to October."  Dr. Milinda Pointer can do any Friday except for October 25.  "Let's do it on October 18."  I called Caren Griffins at [facility] and rescheduled her surgery from 10/24/17 to 01/09/2018.

## 2017-10-14 IMAGING — DX DG CHEST 2V
2 series · 2 of 2 positions shown · non-contrast
Comparison: 06/04/2013 and prior radiograph

CLINICAL DATA: Chest pain and shortness of breath.

EXAM:
CHEST  2 VIEW

[chest pa]
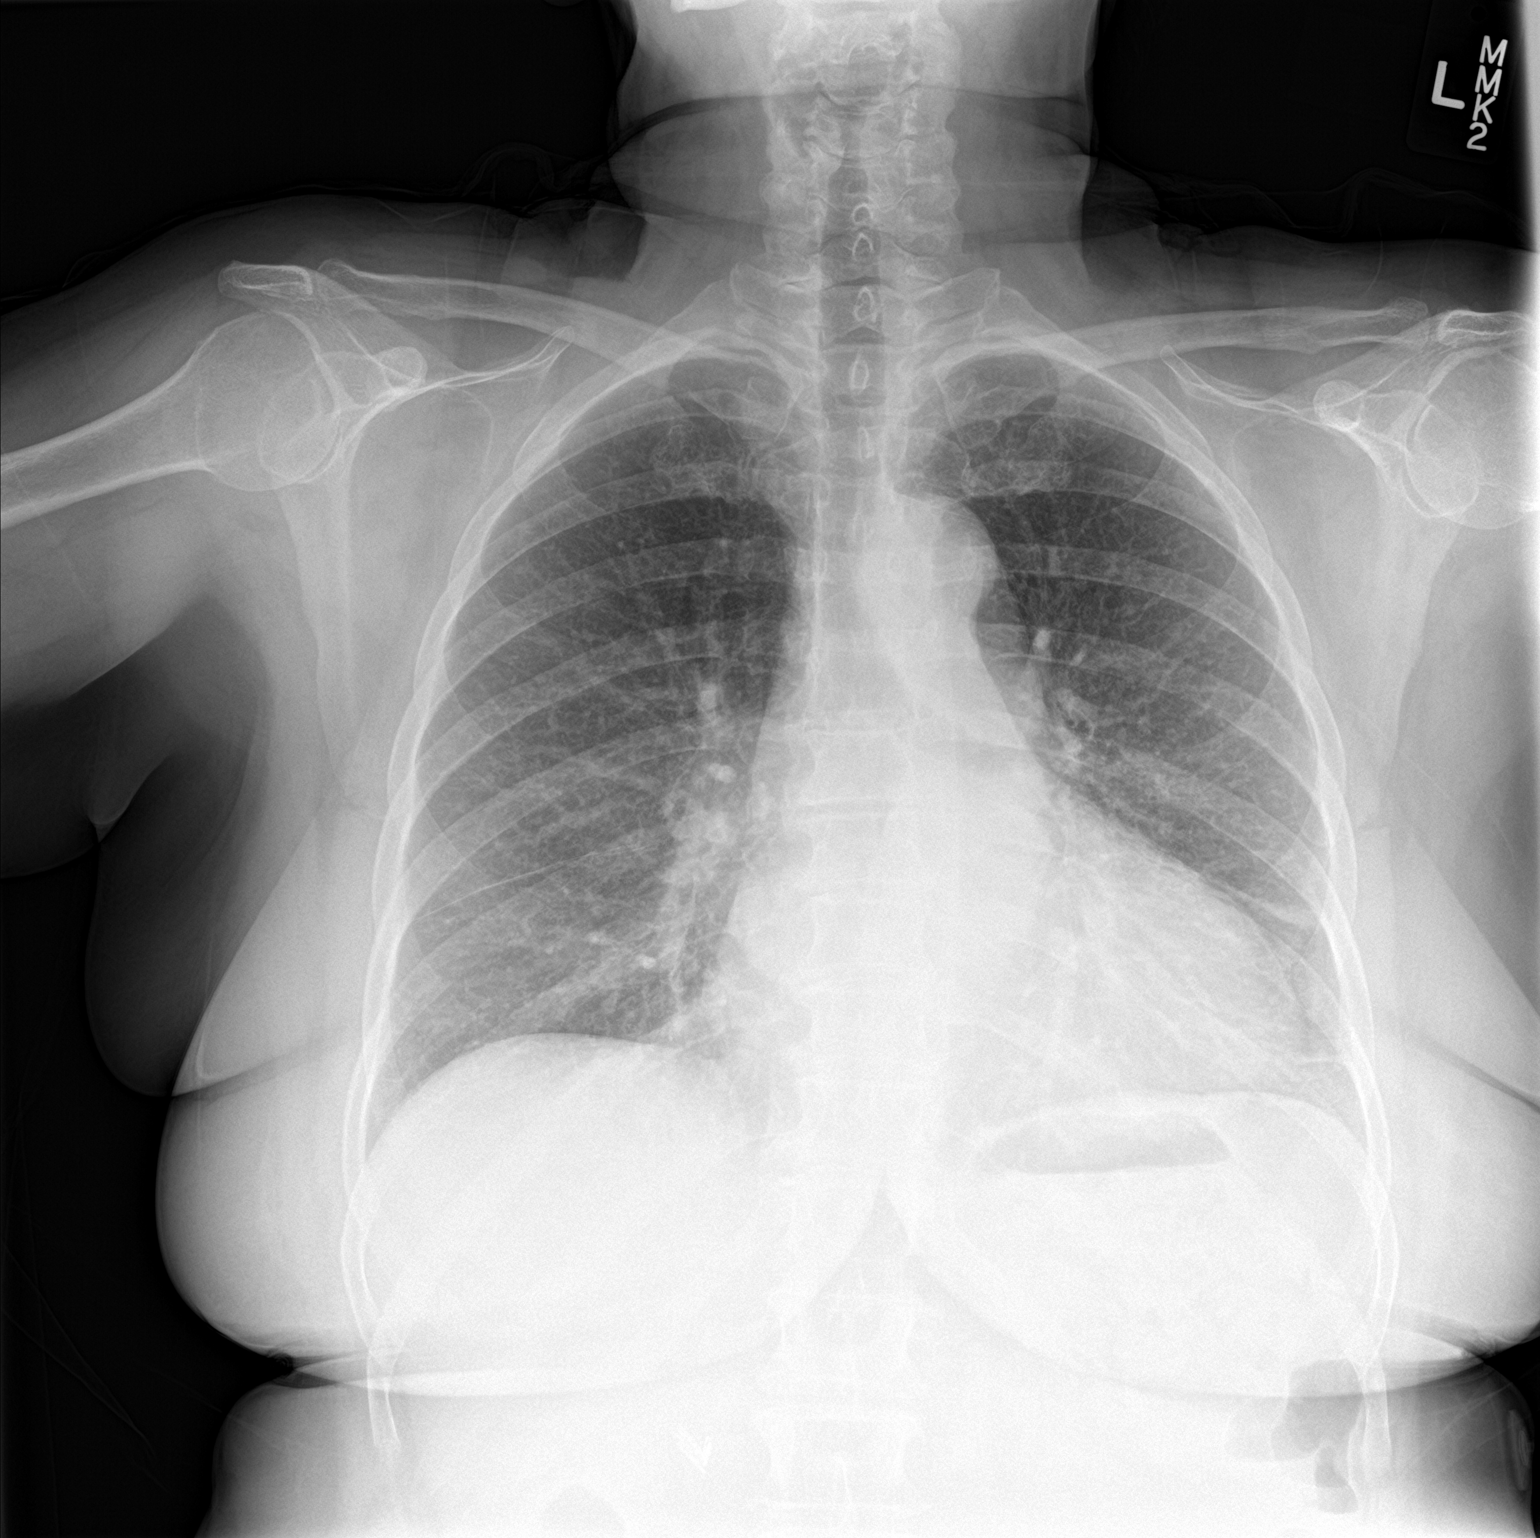

[chest lat]
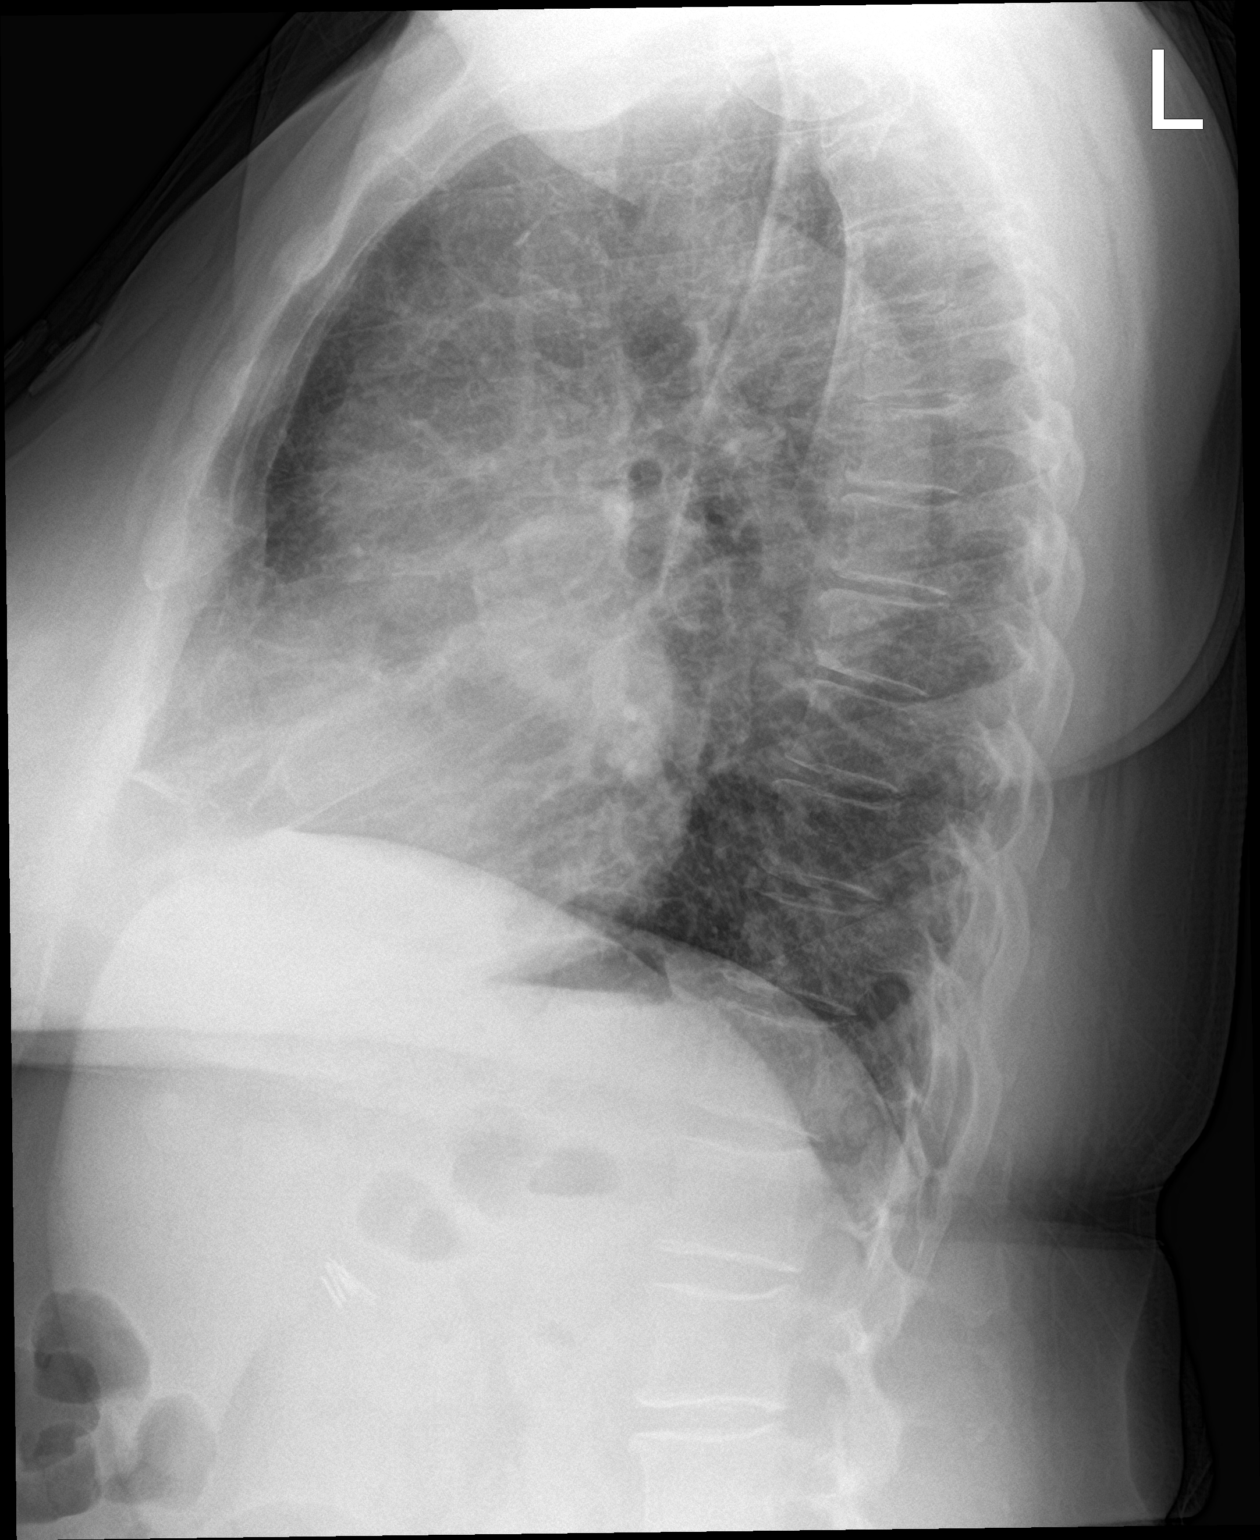

[2 of 2 positions shown; findings below may reference images not displayed]

FINDINGS: Cardiomegaly again noted.

Mild interstitial opacities bilaterally may represent mild
interstitial edema.

No pleural effusion, airspace disease or pneumothorax identified.

No acute bony abnormalities are present.
IMPRESSION: Cardiomegaly with mild interstitial opacities -question mild
interstitial edema.

## 2017-10-29 ENCOUNTER — Ambulatory Visit: Payer: BLUE CROSS/BLUE SHIELD | Admitting: Nurse Practitioner

## 2017-10-30 ENCOUNTER — Other Ambulatory Visit: Payer: BLUE CROSS/BLUE SHIELD

## 2017-12-29 ENCOUNTER — Telehealth: Payer: Self-pay | Admitting: *Deleted

## 2017-12-29 NOTE — Telephone Encounter (Signed)
"  I was scheduled for surgery this month.  I need to cancel it.  If I need anything else, I will be back in touch with you all.  Thank you and have a good day."

## 2017-12-30 NOTE — Telephone Encounter (Signed)
Patients postop appointments have been canceled. °

## 2018-01-15 ENCOUNTER — Other Ambulatory Visit: Payer: BLUE CROSS/BLUE SHIELD

## 2018-01-30 ENCOUNTER — Telehealth: Payer: Self-pay

## 2018-01-30 NOTE — Telephone Encounter (Signed)
The provider part of a [employer] pt asst application left on PA dept desk with a note attached stating to "please call the pt when ready to pick up".  I have completed the provider part of the application and placed it in Dr Kyla Balzarine mail bin awaiting his signature.

## 2018-02-02 NOTE — Telephone Encounter (Signed)
Dr Johnsie Cancel has signed the pts [employer]MS pt asst application. I have left the application in the front office for the pt to pick up. I have also left a message on the pts VM asking her to call me back.

## 2018-02-13 NOTE — Telephone Encounter (Signed)
**Note De-Identified Hildreth Robart Obfuscation** Letter received from [facility] [facility] fax stating that they have approved the pt for pt asst with Eliquis. Approval good from 02/10/18 until 02/11/19.

## 2018-05-08 ENCOUNTER — Ambulatory Visit (INDEPENDENT_AMBULATORY_CARE_PROVIDER_SITE_OTHER): Payer: Self-pay

## 2018-05-08 ENCOUNTER — Encounter: Payer: Self-pay | Admitting: Podiatry

## 2018-05-08 ENCOUNTER — Ambulatory Visit (INDEPENDENT_AMBULATORY_CARE_PROVIDER_SITE_OTHER): Payer: Self-pay | Admitting: Podiatry

## 2018-05-08 VITALS — BP 109/55 | HR 59 | Temp 99.1°F | Resp 14

## 2018-05-08 DIAGNOSIS — S82892A Other fracture of left lower leg, initial encounter for closed fracture: Secondary | ICD-10-CM

## 2018-05-08 DIAGNOSIS — M779 Enthesopathy, unspecified: Secondary | ICD-10-CM

## 2018-05-08 DIAGNOSIS — S99911A Unspecified injury of right ankle, initial encounter: Secondary | ICD-10-CM

## 2018-05-08 NOTE — Patient Instructions (Signed)
ICE INSTRUCTIONS  Apply ice or cold pack to the affected area at least 3 times a day for 10-15 minutes each time.  You should also use ice after prolonged activity or vigorous exercise.  Do not apply ice longer than 20 minutes at one time.  Always keep a cloth between your skin and the ice pack to prevent burns.  Being consistent and following these instructions will help control your symptoms.  We suggest you purchase a gel ice pack because they are reusable and do bit leak.  Some of them are designed to wrap around the area.  Use the method that works best for you.  Here are some other suggestions for icing.   Use a frozen bag of peas or corn-inexpensive and molds well to your body, usually stays frozen for 10 to 20 minutes.  Wet a towel with cold water and squeeze out the excess until it's damp.  Place in a bag in the freezer for 20 minutes. Then remove and use.  Walking Boot, Adult  A walking boot is a medical device that holds your foot or ankle in place after an injury or a medical procedure. This helps with healing and prevents further injury. A walking boot is a removable boot-shaped splint. It has a hard, rigid outer frame that limits movement and supports your foot and leg. The inner lining is a layer of padded material. Walking boots usually have adjustable straps to secure them over the foot and leg. Your health care provider may prescribe a walking boot if you can put weight (bear weight) on your injured foot. How much you can walk while wearing the boot will depend on the type and severity of your injury. Your health care provider will recommend the best boot for you based on your condition. How do I put on my walking boot? There are different types of walking boots. Each type of boot has specific instructions about how to wear it properly. Follow instructions from your health care provider about wearing your boot. In general:  Ask someone to help you put on the boot, if needed.  Sit  to put on your boot. Doing this is more comfortable and it helps to prevent falls.  Open up the boot fully. Place your foot into the boot so your heel rests against the back.  Your toes should be supported by the base of the boot. They should not hang over the front edge.  Adjust the straps so the boot fits securely but is not too tight.  Do not bend the hard frame of the boot to get a good fit. What are some tips for walking with a walking boot?  Do not try to walk without wearing the boot unless your health care provider has approved.  Use other assistive walking devices, including crutches and canes, as told by your health care provider.  On your uninjured foot, wear a shoe with a heel that is close to the height of the walking boot.  Be careful when walking on surfaces that are uneven or wet. How can I reduce swelling while using a walking boot?   Rest your injured foot or leg as much as possible.  If directed, apply ice to the injured area: ? Put ice in a plastic bag. ? Place a towel between your skin and the bag. ? Leave the ice on for 20 minutes, 2-3 times a day.  Keep your injured foot or leg raised (elevated) above the level of your  heart whenever able. Try to do this for at least 2?3 hours each day or as told by your health care provider.  If swelling gets worse, loosen the boot and rest and elevate your foot and leg. How should I take care of my skin and foot while using a walking boot?  Wear a long sock to protect your foot and leg from rubbing inside the boot.  Take off the boot one time each day to check the injured area. Look at your foot, surrounding skin, and leg to make sure there are no sores, rashes, swelling, or wounds. The skin should be a healthy color, not pale or blue.  Try to notice if your walking pattern (gait) in the boot is fairly normal and that you are not walking with a noticeable limp.  Follow instructions from your health care provider about  taking care of your incision or wound, if this applies.  Clean and wash the injured area as told by your health care provider.  Gently dry your foot and leg before putting the boot back on. Are there any activity restrictions? Activity restrictions depend on the type and severity of your injury. Follow instructions from your health care provider about limiting activities.  Bathe and shower as told by your health care provider.  Do not do any activities that could make your injury worse.  Do not drive if your affected foot is one that you usually use for driving. When can I remove my walking boot? Always follow specific directions from your health care provider for removing the walking boot. Generally, it is okay to remove your walking boot:  At the end of the day when you are resting or sleeping.  To clean your foot and leg. How should I keep my walking boot clean?  Do not put any part of the boot in a washing machine or dryer.  Do not use chemical cleaning products. These could irritate your skin, especially if you have a wound or an incision.  Do not soak the liner of the boot.  Use a washcloth with mild soap and water to clean the frame and the liner of the boot by hand.  Allow the boot to air-dry completely before you put it back on your foot. Contact a health care provider if:  The boot is cracked or damaged.  The boot does not fit properly.  Your foot or leg hurts.  You have a rash, sore, or open sore (ulcer) on your foot or leg.  The skin on your foot or leg is pale.  You have a wound or incision on the foot and it is getting worse.  Your skin becomes painful, red, or irritated.  Your swelling does not get better or it gets worse. Get help right away if:  You cannot feel your foot or leg (have numbness).  You cannot feel a pulse at the top of your foot, where your foot and ankle meet.  Your skin on the foot or leg is cold, blue, or gray. Summary  A  walking boot holds your foot or ankle in place after an injury or a medical procedure.  There are different types of walking boots. Follow the specific instructions about how to correctly wear the boot that you have.  Ask someone to help you put on the boot, if needed.  It is important to check your skin and foot every day. Call your health care provider if you notice a rash or sore on your  foot or leg. This information is not intended to replace advice given to you by your health care provider. Make sure you discuss any questions you have with your health care provider. Document Released: 07/26/2014 Document Revised: 04/18/2016 Document Reviewed: 04/18/2016 Elsevier Interactive Patient Education  2019 [employer].

## 2018-05-08 NOTE — Progress Notes (Signed)
Subjective:    Patient ID: Sharon Cain, female    DOB: 17-Oct-1955, 63 y.o.   MRN: [id number]  HPI 63 year old female presents the office today for concerns of right ankle pain after she fell on a slick spot on her porch yesterday morning.  She states that she had immediate pain to the ankle.  She is having difficulty walking.  She has had no recent treatment.  She denies any other injury at the time of the fall.  She has no other concerns.   Review of Systems  All other systems reviewed and are negative.  Past Medical History:  Diagnosis Date  . A-fib ([place]) 2017  . Anxiety   . Arthritis   . Chest pain, central 11/15/2010   Patient admitted to hospital for cardiac rule-out. No MI.    . Depression   . DEPRESSION 04/02/2007   Qualifier: Diagnosis of  By: Henderson Baltimore MD, Janett Billow    . Hyperlipidemia   . Hypertension   . Insomnia   . Obesity     Past Surgical History:  Procedure Laterality Date  . CARPAL TUNNEL RELEASE Bilateral    bilateral  . CHOLECYSTECTOMY     removed before the age of 74  . RIGHT/LEFT HEART CATH AND CORONARY ANGIOGRAPHY N/A 07/30/2016   Procedure: Right/Left Heart Cath and Coronary Angiography;  Surgeon: Belva Crome, MD;  Location: [facility];  Service: Cardiovascular;  Laterality: N/A;     Current Outpatient Medications:  .  escitalopram (LEXAPRO) 20 MG tablet, Take 20 mg by mouth daily., Disp: , Rfl:  .  Methocarbamol (ROBAXIN PO), Take by mouth., Disp: , Rfl:  .  apixaban (ELIQUIS) 5 MG TABS tablet, Take 1 tablet (5 mg total) by mouth 2 (two) times daily., Disp: 180 tablet, Rfl: 3 .  atorvastatin (LIPITOR) 80 MG tablet, , Disp: , Rfl: 8 .  busPIRone (BUSPAR) 10 MG tablet, Take 10 mg by mouth 3 (three) times daily as needed., Disp: , Rfl:  .  carvedilol (COREG) 6.25 MG tablet, TAKE 1 TABLET BY MOUTH TWICE DAILY WITH A MEAL, Disp: 180 tablet, Rfl: 3 .  fexofenadine (ALLEGRA) 180 MG tablet, Take 180 mg by mouth daily., Disp: , Rfl:  .   fluticasone (FLONASE) 50 MCG/ACT nasal spray, Place 1 spray into both nostrils daily., Disp: , Rfl: 0 .  furosemide (LASIX) 20 MG tablet, Take 20 mg by mouth daily., Disp: , Rfl:  .  isosorbide mononitrate (IMDUR) 30 MG 24 hr tablet, TAKE 1/2 (ONE-HALF) TABLET BY MOUTH ONCE DAILY, Disp: 45 tablet, Rfl: 3 .  ondansetron (ZOFRAN ODT) 4 MG disintegrating tablet, Take 1 tablet (4 mg total) by mouth every 8 (eight) hours as needed for nausea or vomiting., Disp: 20 tablet, Rfl: 0 .  traMADol (ULTRAM) 50 MG tablet, TAKE 1 TABLET BY MOUTH EVERY 8 HOURS AS NEEDED FOR UP TO 5 DAYS, Disp: 25 tablet, Rfl: 0 .  zolpidem (AMBIEN) 5 MG tablet, Take 5 mg by mouth at bedtime as needed. , Disp: , Rfl: 0  Allergies  Allergen Reactions  . Sulfa Antibiotics     Pancreatitis  . Amlodipine Swelling  . Lisinopril     Other reaction(s): Other  . Ramipril     Other reaction(s): Other         Objective:   Physical Exam General: AAO x3, NAD  Dermatological: Skin is warm, dry and supple bilateral. Nails x 10 are well manicured; remaining integument appears unremarkable at this time.  There are no open sores, no preulcerative lesions, no rash or signs of infection present.  Vascular: Dorsalis Pedis artery and Posterior Tibial artery pedal pulses are 2/4 bilateral with immedate capillary fill time. Pedal hair growth present. No varicosities and no lower extremity edema present bilateral. There is no pain with calf compression, swelling, warmth, erythema.   Neruologic: Grossly intact via light touch bilateral.   Musculoskeletal: There is tenderness palpation of the distal fibula on the right foot.  There is also discomfort in the course the peroneal tendons just posterior to the lateral malleolus.  Overall the peroneal tendon appears to be intact.  There is no gross ankle instability present.  No other areas of tenderness of the foot or ankle.  Achilles tendon appears to be intact.  No pain to the foot.  Muscular  strength 5/5 in all groups tested bilateral.  Gait: Unassisted, Nonantalgic.     Assessment & Plan:  63 year old female likely avulsion fracture right fibula, tendinitis -Treatment options discussed including all alternatives, risks, and complications -Etiology of symptoms were discussed -X-rays were obtained and reviewed with the patient.  There is an avulsion fracture present of the distal fibula which could be chronic but given she has direct pain in this area we will treat as an acute fracture.  Also getting out of peroneal tendon pain that she is having with immobilized in the cam boot.  This was dispensed today.  Encouraged ice elevation.  Limit activity.  Trula Slade DPM

## 2018-05-19 ENCOUNTER — Encounter: Payer: Self-pay | Admitting: Internal Medicine

## 2018-05-27 ENCOUNTER — Other Ambulatory Visit: Payer: Self-pay | Admitting: Internal Medicine

## 2018-05-27 DIAGNOSIS — R918 Other nonspecific abnormal finding of lung field: Secondary | ICD-10-CM

## 2018-05-29 ENCOUNTER — Ambulatory Visit: Payer: BLUE CROSS/BLUE SHIELD | Admitting: Podiatry

## 2018-06-23 ENCOUNTER — Ambulatory Visit: Payer: BLUE CROSS/BLUE SHIELD | Admitting: Internal Medicine

## 2018-06-29 ENCOUNTER — Other Ambulatory Visit: Payer: Self-pay | Admitting: Cardiovascular Disease

## 2018-07-02 ENCOUNTER — Telehealth: Payer: Self-pay

## 2018-07-02 NOTE — Telephone Encounter (Signed)
Virtual Visit Pre-Appointment Phone Call  Steps For Call:  1. Confirm consent - "In the setting of the current Covid19 crisis, you are scheduled for a (phone or video) visit with your provider on (date) at (time).  Just as we do with many in-office visits, in order for you to participate in this visit, we must obtain consent.  If you'd like, I can send this to your mychart (if signed up) or email for you to review.  Otherwise, I can obtain your verbal consent now.  All virtual visits are billed to your insurance company just like a normal visit would be.  By agreeing to a virtual visit, we'd like you to understand that the technology does not allow for your provider to perform an examination, and thus may limit your provider's ability to fully assess your condition.  Finally, though the technology is pretty good, we cannot assure that it will always work on either your or our end, and in the setting of a video visit, we may have to convert it to a phone-only visit.  In either situation, we cannot ensure that we have a secure connection.  Are you willing to proceed?"  2. Give patient instructions for WebEx download to smartphone as below if video visit  3. Advise patient to be prepared with any vital sign or heart rhythm information, their current medicines, and a piece of paper and pen handy for any instructions they may receive the day of their visit  4. Inform patient they will receive a phone call 15 minutes prior to their appointment time (may be from unknown caller ID) so they should be prepared to answer  5. Confirm that appointment type is correct in Epic appointment notes (video vs telephone)    TELEPHONE CALL NOTE  Sharon Cain has been deemed a candidate for a follow-up tele-health visit to limit community exposure during the Covid-19 pandemic. I spoke with the patient via phone to ensure availability of phone/video source, confirm preferred email & phone number, and discuss  instructions and expectations.  I reminded Sharon Cain to be prepared with any vital sign and/or heart rhythm information that could potentially be obtained via home monitoring, at the time of her visit. I reminded Sharon Cain to expect a phone call at the time of her visit if her visit.  Did the patient verbally acknowledge consent to treatment? Yes  Sharon Barter, RN 07/02/2018 3:39 PM   DOWNLOADING THE [employer], go to [employer] and type in WebEx in the search bar. [place] [employer], the blue/green circle. The app is free but as with any other app downloads, their phone may require them to verify saved payment information or Apple password. The patient does NOT have to create an account.  - If Android, ask patient to go to [employer] and type in WebEx in the search bar. [place] [employer], the blue/green circle. The app is free but as with any other app downloads, their phone may require them to verify saved payment information or Android password. The patient does NOT have to create an account.   CONSENT FOR TELE-HEALTH VISIT - PLEASE REVIEW  I hereby voluntarily request, consent and authorize [facility] and its employed or contracted physicians, physician assistants, nurse practitioners or other licensed health care professionals (the Practitioner), to provide me with telemedicine health care services (the "Services") as deemed necessary by the treating Practitioner.  I acknowledge and consent to receive the Services by the Practitioner via telemedicine. I understand that the telemedicine visit will involve communicating with the Practitioner through live audiovisual communication technology and the disclosure of certain medical information by electronic transmission. I acknowledge that I have been given the opportunity to request an in-person assessment or other available alternative prior to the telemedicine visit  and am voluntarily participating in the telemedicine visit.  I understand that I have the right to withhold or withdraw my consent to the use of telemedicine in the course of my care at any time, without affecting my right to future care or treatment, and that the Practitioner or I may terminate the telemedicine visit at any time. I understand that I have the right to inspect all information obtained and/or recorded in the course of the telemedicine visit and may receive copies of available information for a reasonable fee.  I understand that some of the potential risks of receiving the Services via telemedicine include:  Marland Kitchen Delay or interruption in medical evaluation due to technological equipment failure or disruption; . Information transmitted may not be sufficient (e.g. poor resolution of images) to allow for appropriate medical decision making by the Practitioner; and/or  . In rare instances, security protocols could fail, causing a breach of personal health information.  Furthermore, I acknowledge that it is my responsibility to provide information about my medical history, conditions and care that is complete and accurate to the best of my ability. I acknowledge that Practitioner's advice, recommendations, and/or decision may be based on factors not within their control, such as incomplete or inaccurate data provided by me or distortions of diagnostic images or specimens that may result from electronic transmissions. I understand that the practice of medicine is not an exact science and that Practitioner makes no warranties or guarantees regarding treatment outcomes. I acknowledge that I will receive a copy of this consent concurrently upon execution via email to the email address I last provided but may also request a printed copy by calling the office of [facility].    I understand that my insurance will be billed for this visit.   I have read or had this consent read to me. . I understand  the contents of this consent, which adequately explains the benefits and risks of the Services being provided via telemedicine.  . I have been provided ample opportunity to ask questions regarding this consent and the Services and have had my questions answered to my satisfaction. . I give my informed consent for the services to be provided through the use of telemedicine in my medical care  By participating in this telemedicine visit I agree to the above.  Patient will use [employer] for virtual visit

## 2018-07-07 NOTE — Progress Notes (Signed)
Virtual Visit via Video Note   This visit type was conducted due to national recommendations for restrictions regarding the COVID-19 Pandemic (e.g. social distancing) in an effort to limit this patient's exposure and mitigate transmission in our community.  Due to her co-morbid illnesses, this patient is at least at moderate risk for complications without adequate follow up.  This format is felt to be most appropriate for this patient at this time.  All issues noted in this document were discussed and addressed.  A limited physical exam was performed with this format.  Please refer to the patient's chart for her consent to telehealth for [facility]   Evaluation Performed:  Follow-up visit  Date:  07/09/2018   ID:  Sharon Cain, DOB [birth year], MRN [id number]  Patient Location: Home  Provider Location: Office  PCP:  Velna Hatchet, MD  Cardiologist:  Johnsie Cancel Electrophysiologist:  None   Chief Complaint:  DCM  History of Present Illness:    Sharon Cain is a 63 y.o. female who presents via audio/video conferencing for a telehealth visit today.    History of PAF, HTN, OSA on CPAP chronic LBBB Non ischemic DCM mild Normal cath 07/30/16 with EF 40-45%. Allergic to ACE and has leg pains with norvasc Maintained on beta blockers and eliquis Activity limited by chronic heel pain and recent right distal fibula avulsion fracture  No cardiac complaints Compliant with meds   The patient does not have symptoms concerning for COVID-19 infection (fever, chills, cough, or new shortness of breath).    Past Medical History:  Diagnosis Date  . A-fib ([place]) 2017  . Anxiety   . Arthritis   . Chest pain, central 11/15/2010   Patient admitted to hospital for cardiac rule-out. No MI.    . Depression   . DEPRESSION 04/02/2007   Qualifier: Diagnosis of  By: Henderson Baltimore MD, Janett Billow    . Hyperlipidemia   . Hypertension   . Insomnia   . Obesity    Past Surgical History:  Procedure Laterality  Date  . CARPAL TUNNEL RELEASE Bilateral    bilateral  . CHOLECYSTECTOMY     removed before the age of 22  . RIGHT/LEFT HEART CATH AND CORONARY ANGIOGRAPHY N/A 07/30/2016   Procedure: Right/Left Heart Cath and Coronary Angiography;  Surgeon: Belva Crome, MD;  Location: [place] [facility];  Service: Cardiovascular;  Laterality: N/A;     Current Meds  Medication Sig  . apixaban (ELIQUIS) 5 MG TABS tablet Take 1 tablet (5 mg total) by mouth 2 (two) times daily.  Marland Kitchen atorvastatin (LIPITOR) 80 MG tablet   . atorvastatin (LIPITOR) 80 MG tablet Take 40 mg by mouth daily.  . busPIRone (BUSPAR) 10 MG tablet Take 10 mg by mouth 3 (three) times daily as needed.  . carvedilol (COREG) 6.25 MG tablet TAKE 1 TABLET BY MOUTH TWICE DAILY WITH A MEAL  . escitalopram (LEXAPRO) 20 MG tablet Take 20 mg by mouth daily.  . fexofenadine (ALLEGRA) 180 MG tablet Take 180 mg by mouth daily.  . fluticasone (FLONASE) 50 MCG/ACT nasal spray Place 1 spray into both nostrils daily.  . furosemide (LASIX) 20 MG tablet Take 20 mg by mouth daily.  . isosorbide mononitrate (IMDUR) 30 MG 24 hr tablet Take 1/2 (one-half) tablet by mouth once daily  . meclizine (ANTIVERT) 12.5 MG tablet Take 1 tablet by mouth as needed for dizziness.  . Methocarbamol (ROBAXIN PO) Take 1 tablet by mouth as needed.   . ondansetron [facility]  ODT) 4 MG disintegrating tablet Take 1 tablet (4 mg total) by mouth every 8 (eight) hours as needed for nausea or vomiting.  Marland Kitchen zolpidem (AMBIEN) 5 MG tablet Take 5 mg by mouth at bedtime as needed.      Allergies:   Sulfa antibiotics; Amlodipine; Lisinopril; and Ramipril   Social History   Tobacco Use  . Smoking status: Former Smoker    Packs/day: 1.00    Types: Cigarettes    Last attempt to quit: 12/09/2015    Years since quitting: 2.5  . Smokeless tobacco: Never Used  Substance Use Topics  . Alcohol use: No    Alcohol/week: 0.0 standard drinks  . Drug use: No     Family Hx: The patient's family  history includes Atrial fibrillation in her mother; COPD in her father and mother; Congestive Heart Failure in her father; Diabetes in her mother; Heart failure in her mother; Sudden Cardiac Death in her father.  ROS:   Please see the history of present illness.     All other systems reviewed and are negative.   Prior CV studies:   The following studies were reviewed today:  Cath 07/30/16   Labs/Other Tests and Data Reviewed:    EKG:   Sinus rhythm rate 54 LBBB chornic 07/28/16  Recent Labs: No results found for requested labs within last 8760 hours.   Recent Lipid Panel Lab Results  Component Value Date/Time   CHOL 105 01/14/2016 09:08 AM   TRIG 72 01/14/2016 09:08 AM   HDL 34 (L) 01/14/2016 09:08 AM   CHOLHDL 3.1 01/14/2016 09:08 AM   LDLCALC 57 01/14/2016 09:08 AM   LDLDIRECT 149 (H) 02/27/2010 08:15 PM    Wt Readings from Last 3 Encounters:  07/09/18 89.4 kg  07/22/17 95.8 kg  07/08/17 97.1 kg     Objective:    Vital Signs:  Ht 4\' 11"  (1.499 m)   Wt 89.4 kg   BMI 39.79 kg/m    Overweight white female Poor dentition No JVP elevation No tachypnea No edema Skin warm and dry  ASSESSMENT & PLAN:    1. DCM:  Functional class one will update echo when COVID 19 restrictions finished continue lasix and coreg Allergic to ACE 2. LBBB chronic no high grade AV block yearly ECG 3. PAF:  Maintaining NSR continue eliquis and beta blocker 4. Ortho: chronic heal pain new right fibula avulsion fracture    COVID-19 Education: The signs and symptoms of COVID-19 were discussed with the patient and how to seek care for testing (follow up with PCP or arrange E-visit).  The importance of social distancing was discussed today.  Time:   Today, I have spent 30 minutes with the patient with telehealth technology discussing the above problems.     Medication Adjustments/Labs and Tests Ordered: Current medicines are reviewed at length with the patient today.  Concerns regarding  medicines are outlined above.   Tests Ordered:  Echo in 3 months when COVID 19 restrictions lifted   Medication Changes: No orders of the defined types were placed in this encounter.   Disposition:  Follow up in a  Year if echo ok   Signed, Jenkins Rouge, MD  07/09/2018 2:06 PM    [place]

## 2018-07-09 ENCOUNTER — Encounter: Payer: Self-pay | Admitting: Cardiovascular Disease

## 2018-07-09 ENCOUNTER — Other Ambulatory Visit: Payer: Self-pay

## 2018-07-09 ENCOUNTER — Telehealth (INDEPENDENT_AMBULATORY_CARE_PROVIDER_SITE_OTHER): Payer: PRIVATE HEALTH INSURANCE | Admitting: Cardiovascular Disease

## 2018-07-09 VITALS — Ht 59.0 in | Wt 197.0 lb

## 2018-07-09 DIAGNOSIS — I429 Cardiomyopathy, unspecified: Secondary | ICD-10-CM

## 2018-07-09 NOTE — Patient Instructions (Signed)
Medication Instructions:   If you need a refill on your cardiac medications before your next appointment, please call your pharmacy.   Lab work:  If you have labs (blood work) drawn today and your tests are completely normal, you will receive your results only by: Marland Kitchen MyChart Message (if you have MyChart) OR . A paper copy in the mail If you have any lab test that is abnormal or we need to change your treatment, we will call you to review the results.  Testing/Procedures: Your physician has requested that you have an echocardiogram in 3 months. Echocardiography is a painless test that uses sound waves to create images of your heart. It provides your doctor with information about the size and shape of your heart and how well your heart's chambers and valves are working. This procedure takes approximately one hour. There are no restrictions for this procedure.  Follow-Up: At [facility], you and your health needs are our priority.  As part of our continuing mission to provide you with exceptional heart care, we have created designated Provider Care Teams.  These Care Teams include your primary Cardiologist (physician) and Advanced Practice Providers (APPs -  Physician Assistants and Nurse Practitioners) who all work together to provide you with the care you need, when you need it. You will need a follow up appointment in 1 years.  Please call our office 2 months in advance to schedule this appointment.  You may see Dr. Johnsie Cancel or one of the following Advanced Practice Providers on your designated Care Team:   Truitt Merle, NP Cecilie Kicks, NP . Kathyrn Drown, NP

## 2018-10-08 ENCOUNTER — Other Ambulatory Visit: Payer: Self-pay

## 2018-10-08 ENCOUNTER — Ambulatory Visit (HOSPITAL_COMMUNITY): Payer: PRIVATE HEALTH INSURANCE | Attending: Cardiovascular Disease

## 2018-10-08 DIAGNOSIS — I429 Cardiomyopathy, unspecified: Secondary | ICD-10-CM | POA: Diagnosis present

## 2018-10-26 ENCOUNTER — Other Ambulatory Visit: Payer: Self-pay | Admitting: Cardiovascular Disease

## 2018-12-23 ENCOUNTER — Telehealth: Payer: Self-pay

## 2018-12-23 NOTE — Telephone Encounter (Signed)
**Note De-Identified Sharon Cain Obfuscation** The pt left the provider part of her [place] Pt Asst application for Eliquis. We have completed the application, Dr Johnsie Cancel has signed it and per the pts request we have left it at our screening table in the downstairs lobby at Dr Kyla Balzarine office in [place] for her to pick up.  I have left a detailed message on the pts VM stating that the providers part of her [employer] pt asst application is ready for her to pick up.

## 2019-01-18 ENCOUNTER — Other Ambulatory Visit: Payer: Self-pay

## 2019-01-18 MED ORDER — APIXABAN 5 MG PO TABS: 5.0000 mg | ORAL_TABLET | Freq: Two times a day (BID) | ORAL | 1 refills | Status: DC

## 2019-01-18 NOTE — Telephone Encounter (Signed)
Pt last saw Dr Johnsie Cancel 07/09/18 telemedicine Covid-19, last labs 04/06/18 Creat 0.7 at [facility]. Per KPN, age 63, weight 89.4kg, based on specified criteria pt is on appropriate dosage of Eliquis 5mg  BID.  Will refill rx.

## 2019-01-27 NOTE — Telephone Encounter (Signed)
**Note De-Identified Sharon Cain Obfuscation** Letter received from [employer] stating that they have approved the pt for pt asst with her Eliquis. Approval is good until 03/25/2019. RE: [id number]  The letter states that they have notified the pt of this approval as well.

## 2019-05-17 ENCOUNTER — Telehealth: Payer: Self-pay

## 2019-05-17 NOTE — Telephone Encounter (Signed)
**Note De-Identified Berenis Corter Obfuscation** The pt left the provider page of a [employer] pt asst app for her Eliquis at the office.  I completed the MD page and then called the pt to ask if she wants [place] to fax to BMS once Dr Mariana Arn signs it or if she wants to pick it up. She states that she will pick up and is aware that Dr Johnsie Cancel will be in the office on Wednesday 2/24 this week.  I have scanned and emailed the completed MD page to Dr Mariana Arn nurse to obtain Dr Mariana Arn signature, date and to call the pt when ready for pick up.

## 2019-05-19 NOTE — Telephone Encounter (Signed)
Called patient to let her know that paper has been signed and will be at front desk for her to pick up.

## 2019-06-07 ENCOUNTER — Telehealth: Payer: Self-pay | Admitting: Cardiovascular Disease

## 2019-06-07 NOTE — Telephone Encounter (Signed)
Called pt and pt stated that she has already faxed the paperwork to the pt assistant program, but has not heard anything from them. Jeani Hawking, LPN, could you please advise in this mater. Thanks

## 2019-06-07 NOTE — Telephone Encounter (Signed)
Patient calling the office for samples of medication: ° ° °1.  What medication and dosage are you requesting samples for? ° °apixaban (ELIQUIS) 5 MG TABS tablet ° °2.  Are you currently out of this medication?  yes ° ° °

## 2019-06-10 NOTE — Telephone Encounter (Signed)
No answer so I left a message on her VM advising her that I have no updates on her application and to contact [employer] Pt Asst program at [phone] to check the progress of her application.

## 2019-06-23 ENCOUNTER — Telehealth: Payer: Self-pay | Admitting: Cardiovascular Disease

## 2019-06-23 MED ORDER — APIXABAN 5 MG PO TABS: 5.0000 mg | ORAL_TABLET | Freq: Two times a day (BID) | ORAL | 1 refills | Status: DC

## 2019-06-23 NOTE — Telephone Encounter (Addendum)
Last OV 07/09/2018 64 years old 89kg Scr 0.7 on 04/20/19 per KPN Eliquis 5mg  BID sent to pharmacy

## 2019-06-23 NOTE — Telephone Encounter (Signed)
Called patient to let her know about the co pay card at $10/month. Patient will call her pharmacy and see it that went through. If she has any issues, patient will give our office a call back. Patient stated she took her last eliquis this morning and she would need samples or change medication if co pay card did not work

## 2019-06-23 NOTE — Telephone Encounter (Signed)
It will, I called it in and they confirmed 3 month supply is $30, or she can pick up 1 month for $10.

## 2019-06-23 NOTE — Telephone Encounter (Signed)
Patient calling backing stating the eliquis is too expensive and needs to be switched to something else.

## 2019-06-23 NOTE — Telephone Encounter (Signed)
New message  Pt c/o medication issue:  1. Name of Medication:apixaban (ELIQUIS) 5 MG TABS tablet  2. How are you currently taking this medication (dosage and times per day)?as written  3. Are you having a reaction (difficulty breathing--STAT)? No   4. What is your medication issue? Patient needs a new prescription for apixaban (ELIQUIS) 5 MG TABS tablet sent Sharon Cain [address], [place] [id number] Patient is out of medication.

## 2019-06-23 NOTE — Telephone Encounter (Addendum)
Per [id number] note, application for patient assistance through [employer] has already been faxed and pt was encouraged to contact them to follow up on the status of her application [id number].  It looks as though patient has Pharmacist, community so she can just use a $10/month copay card. I have activated a copay card and called this in to patient's pharmacy. They confirmed a 3 month supply will cost $30.  Left message for pt to make her aware.

## 2019-07-06 ENCOUNTER — Encounter: Payer: Self-pay | Admitting: Orthopaedic Surgery

## 2019-07-06 ENCOUNTER — Ambulatory Visit (INDEPENDENT_AMBULATORY_CARE_PROVIDER_SITE_OTHER): Payer: 59

## 2019-07-06 ENCOUNTER — Other Ambulatory Visit: Payer: Self-pay

## 2019-07-06 ENCOUNTER — Ambulatory Visit: Payer: Self-pay

## 2019-07-06 ENCOUNTER — Other Ambulatory Visit: Payer: Self-pay | Admitting: Physician Assistant

## 2019-07-06 ENCOUNTER — Ambulatory Visit (INDEPENDENT_AMBULATORY_CARE_PROVIDER_SITE_OTHER): Payer: 59 | Admitting: Orthopaedic Surgery

## 2019-07-06 DIAGNOSIS — M79641 Pain in right hand: Secondary | ICD-10-CM | POA: Diagnosis not present

## 2019-07-06 DIAGNOSIS — M79642 Pain in left hand: Secondary | ICD-10-CM

## 2019-07-06 MED ORDER — DICLOFENAC SODIUM 75 MG PO TBEC: 75.0000 mg | DELAYED_RELEASE_TABLET | Freq: Two times a day (BID) | ORAL | 2 refills | Status: DC | PRN

## 2019-07-06 MED ORDER — METHYLPREDNISOLONE ACETATE 40 MG/ML IJ SUSP
13.3300 mg | INTRAMUSCULAR | Status: AC | PRN
  Administered 2019-07-06: 13.33 mg

## 2019-07-06 MED ORDER — LIDOCAINE HCL 1 % IJ SOLN
3.0000 mL | INTRAMUSCULAR | Status: AC | PRN
  Administered 2019-07-06: 3 mL

## 2019-07-06 MED ORDER — BUPIVACAINE HCL 0.25 % IJ SOLN
0.3300 mL | INTRAMUSCULAR | Status: AC | PRN
  Administered 2019-07-06: .33 mL

## 2019-07-06 MED ORDER — DICLOFENAC SODIUM 1 % EX GEL: 2.0000 g | Freq: Four times a day (QID) | CUTANEOUS | 1 refills | Status: DC

## 2019-07-06 NOTE — Progress Notes (Signed)
Office Visit Note   Patient: Sharon Cain           Date of Birth: [birth year]           MRN: [id number] Visit Date: 07/06/2019              Requested by: Velna Hatchet, MD [address] [place],  [place] [address] PCP: Velna Hatchet, MD   Assessment & Plan: Visit Diagnoses:  1. Bilateral hand pain     Plan: Impression is right hand plantar process inflammatory process and left thumb CMC arthritis.  In regards to the right hand, have called in Voltaren gel to use as needed.  In regards to the left thumb, we have injected this with cortisone.  She will follow-up with [place] as needed.  Follow-Up Instructions: Return if symptoms worsen or fail to improve.   Orders:  Orders Placed This Encounter  Procedures  . XR Hand Complete Left  . XR Hand Complete Right   Meds ordered this encounter  Medications  . DISCONTD: diclofenac (VOLTAREN) 75 MG EC tablet    Sig: Take 1 tablet (75 mg total) by mouth 2 (two) times daily as needed.    Dispense:  60 tablet    Refill:  2      Procedures: Hand/UE Inj: L thumb CMC for osteoarthritis on 07/06/2019 11:24 AM Medications: 3 mL lidocaine 1 %; 0.33 mL bupivacaine 0.25 %; 13.33 mg methylPREDNISolone acetate 40 MG/ML      Clinical Data: No additional findings.   Subjective: Chief Complaint  Patient presents with  . Right Hand - Pain  . Left Thumb - Pain    HPI patient is a pleasant 64 year old right-hand-dominant female who comes in today with bilateral hand pain.  In regards to her right hand the pain she has is to the volar aspect between the fourth and fifth metacarpal carpal heads.  She notes that this is more of a burn worse when bagging items.  She does work in [employer] where she is frequently doing this.  She has never had a specific injury but notes that her pain is worsened over the past few months.  She does not take any medication for this.  Does have a history of bilateral carpal tunnel release.  No history  of neck pathology.  In regards to the left hand, the pain she has is to the thumb near the first [facility] joint.  She has had pain here for the past several weeks worse with gripping and opening jars.  No previous cortisone injection.  No weakness.  No numbness, tingling or burning.  Review of Systems as detailed in HPI.  All others reviewed and are negative.   Objective: Vital Signs: There were no vitals taken for this visit.  Physical Exam well-developed well-nourished female no acute distress.  Alert and oriented x3.  Ortho Exam examination of her right hand reveals no tenderness or nodules at the A1 pulleys of the fourth or fifth fingers.  She does have slight pain and burning sensation with abduction of the fourth and fifth fingers.  No interosseous atrophy.  No triggering.  Examination of her left hand reveals moderate tenderness throughout the first [facility] joint.  Minimally positive grind test.  No tenderness to the first dorsal compartment.  She is neurovascular intact distally.  Specialty Comments:  No specialty comments available.  Imaging: XR Hand Complete Left  Result Date: 07/06/2019 Mild degenerative changes the first [facility] joint  XR Hand Complete  Right  Result Date: 07/06/2019 No acute or structural abnormalities    PMFS History: Patient Active Problem List   Diagnosis Date Noted  . Obstructive sleep apnea treated with continuous positive airway pressure (CPAP) 07/22/2017  . Cyst of ovary 05/06/2016  . Chronic systolic CHF (congestive heart failure) ([place]) 01/15/2016  . Hyperglycemia 12/12/2015  . New onset atrial fibrillation ([place]) 12/10/2015  . Hypokalemia 12/10/2015  . Atrial fibrillation ([place]) 12/10/2015  . Chest pain 06/04/2013  . Back pain with left-sided sciatica 12/24/2012  . OBESITY 07/26/2009  . INSOMNIA-SLEEP DISORDER-UNSPEC 05/18/2007  . OSTEOARTHRITIS 04/02/2007  . HYPERLIPIDEMIA 03/30/2007  . Anxiety state 03/30/2007  . Tobacco abuse 03/30/2007   Past  Medical History:  Diagnosis Date  . A-fib ([place]) 2017  . Anxiety   . Arthritis   . Chest pain, central 11/15/2010   Patient admitted to hospital for cardiac rule-out. No MI.    . Depression   . DEPRESSION 04/02/2007   Qualifier: Diagnosis of  By: Henderson Baltimore MD, Janett Billow    . Hyperlipidemia   . Hypertension   . Insomnia   . Obesity     Family History  Problem Relation Age of Onset  . Atrial fibrillation Mother   . Diabetes Mother   . Heart failure Mother   . COPD Mother   . COPD Father   . Congestive Heart Failure Father   . Sudden Cardiac Death Father     Past Surgical History:  Procedure Laterality Date  . CARPAL TUNNEL RELEASE Bilateral    bilateral  . CHOLECYSTECTOMY     removed before the age of 43  . RIGHT/LEFT HEART CATH AND CORONARY ANGIOGRAPHY N/A 07/30/2016   Procedure: Right/Left Heart Cath and Coronary Angiography;  Surgeon: Belva Crome, MD;  Location: [place] [facility];  Service: Cardiovascular;  Laterality: N/A;   Social History   Occupational History  . Not on file  Tobacco Use  . Smoking status: Former Smoker    Packs/day: 1.00    Types: Cigarettes    Quit date: 12/09/2015    Years since quitting: 3.5  . Smokeless tobacco: Never Used  Substance and Sexual Activity  . Alcohol use: No    Alcohol/week: 0.0 standard drinks  . Drug use: No  . Sexual activity: Not on file

## 2019-08-08 ENCOUNTER — Other Ambulatory Visit: Payer: Self-pay | Admitting: Cardiovascular Disease

## 2019-08-25 NOTE — Progress Notes (Signed)
Evaluation Performed:  Follow-up visit  Date:  08/27/2019   ID:  Sharon Cain, DOB 02-26-1956, MRN [id number]  PCP:  Velna Hatchet, MD  Cardiologist:  Johnsie Cancel Electrophysiologist:  None   Chief Complaint:  DCM  History of Present Illness:    Sharon Cain is a 64 y.o. female with a  istory of PAF, HTN, OSA on CPAP chronic LBBB Non ischemic DCM mild Normal cath 07/30/16 with EF 40-45%. Allergic to ACE and has leg pains with norvasc Maintained on beta blockers and eliquis Activity limited by chronic heel pain and recent right distal fibula avulsion fracture  No cardiac complaints Compliant with meds  Last echo a year ago 10/08/18 EF improved to 60-65%   I take care of her mom and saw her husband in hospital last week He had renal failure With volume overload and is on dialysis now  She will be quitting her job at [employer] to care for them   The patient does not have symptoms concerning for COVID-19 infection (fever, chills, cough, or new shortness of breath).    Past Medical History:  Diagnosis Date  . A-fib ([place]) 2017  . Anxiety   . Arthritis   . Chest pain, central 11/15/2010   Patient admitted to hospital for cardiac rule-out. No MI.    . Depression   . DEPRESSION 04/02/2007   Qualifier: Diagnosis of  By: Henderson Baltimore MD, Janett Billow    . Hyperlipidemia   . Hypertension   . Insomnia   . Obesity    Past Surgical History:  Procedure Laterality Date  . CARPAL TUNNEL RELEASE Bilateral    bilateral  . CHOLECYSTECTOMY     removed before the age of 28  . RIGHT/LEFT HEART CATH AND CORONARY ANGIOGRAPHY N/A 07/30/2016   Procedure: Right/Left Heart Cath and Coronary Angiography;  Surgeon: Belva Crome, MD;  Location: [place] [facility];  Service: Cardiovascular;  Laterality: N/A;     Current Meds  Medication Sig  . apixaban (ELIQUIS) 5 MG TABS tablet Take 1 tablet (5 mg total) by mouth 2 (two) times daily.  Marland Kitchen atorvastatin (LIPITOR) 80 MG tablet   . busPIRone (BUSPAR) 10 MG  tablet Take 10 mg by mouth 3 (three) times daily as needed.  . carvedilol (COREG) 6.25 MG tablet Take 1 tablet (6.25 mg total) by mouth 2 (two) times daily with a meal. Please keep 08/27/2019 appointment for further refills  . cetirizine (ZYRTEC) 10 MG tablet Take 10 mg by mouth daily.  . diclofenac Sodium (VOLTAREN) 1 % GEL Apply 2 g topically 4 (four) times daily.  Marland Kitchen escitalopram (LEXAPRO) 20 MG tablet Take 20 mg by mouth daily.  . fexofenadine (ALLEGRA) 180 MG tablet Take 180 mg by mouth daily.  . fluticasone (FLONASE) 50 MCG/ACT nasal spray Place 1 spray into both nostrils daily.  . furosemide (LASIX) 20 MG tablet Take 20 mg by mouth daily.  . isosorbide mononitrate (IMDUR) 30 MG 24 hr tablet Take 0.5 tablets (15 mg total) by mouth daily. Please keep 08/27/2019 appointment for further refills  . meclizine (ANTIVERT) 12.5 MG tablet Take 1 tablet by mouth as needed for dizziness.  . Methocarbamol (ROBAXIN PO) Take 1 tablet by mouth as needed.   . ondansetron (ZOFRAN ODT) 4 MG disintegrating tablet Take 1 tablet (4 mg total) by mouth every 8 (eight) hours as needed for nausea or vomiting.  Marland Kitchen zolpidem (AMBIEN) 5 MG tablet Take 5 mg by mouth at bedtime as needed.   . [  DISCONTINUED] atorvastatin (LIPITOR) 80 MG tablet Take 40 mg by mouth daily.     Allergies:   Sulfa antibiotics, Amlodipine, Lisinopril, and Ramipril   Social History   Tobacco Use  . Smoking status: Former Smoker    Packs/day: 1.00    Types: Cigarettes    Quit date: 12/09/2015    Years since quitting: 3.7  . Smokeless tobacco: Never Used  Substance Use Topics  . Alcohol use: No    Alcohol/week: 0.0 standard drinks  . Drug use: No     Family Hx: [name] includes Atrial fibrillation in her mother; COPD in her father and mother; Congestive Heart Failure in her father; Diabetes in her mother; Heart failure in her mother; Sudden Cardiac Death in her father.  ROS:   Please see the history of present  illness.     All other systems reviewed and are negative.   Prior CV studies:   The following studies were reviewed today:  Cath 07/30/16  Echo 10/08/18  Labs/Other Tests and Data Reviewed:    EKG:   Sinus rhythm rate 54 LBBB chornic 07/28/16  08/27/19 SR rate 60 IVCD chronic T wave changes   Recent Labs: No results found for requested labs within last 8760 hours.   Recent Lipid Panel Lab Results  Component Value Date/Time   CHOL 105 01/14/2016 09:08 AM   TRIG 72 01/14/2016 09:08 AM   HDL 34 (L) 01/14/2016 09:08 AM   CHOLHDL 3.1 01/14/2016 09:08 AM   LDLCALC 57 01/14/2016 09:08 AM   LDLDIRECT 149 (H) 02/27/2010 08:15 PM    Wt Readings from Last 3 Encounters:  08/27/19 203 lb (92.1 kg)  07/09/18 197 lb (89.4 kg)  07/22/17 211 lb 3.2 oz (95.8 kg)     Objective:    Vital Signs:  BP 110/68   Pulse 60   Ht 4' 11.75" (1.518 m)   Wt 203 lb (92.1 kg)   BMI 39.98 kg/m    Affect appropriate Overweight white female  HEENT: poor dentition  Neck supple with no adenopathy JVP normal no bruits no thyromegaly Lungs clear with no wheezing and good diaphragmatic motion Heart:  S1/S2 no murmur, no rub, gallop or click PMI normal Abdomen: benighn, BS positve, no tenderness, no AAA no bruit.  No HSM or HJR Distal pulses intact with no bruits No edema Neuro non-focal Skin warm and dry No muscular weakness   ASSESSMENT & PLAN:    1. DCM:  Functional class one improved  EF 60-65% 10/08/18  2. LBBB chronic no high grade AV block yearly ECG 3. PAF:  Maintaining NSR continue eliquis and beta blocker Samples of eliquis given As she may have to wait till end of next month to afford it on medicare  4. Ortho: chronic heal pain new right fibula avulsion fracture    COVID-19 Education: The signs and symptoms of COVID-19 were discussed with the patient and how to seek care for testing (follow up with PCP or arrange E-visit).  The importance of social distancing was discussed today.      Medication Adjustments/Labs and Tests Ordered: Current medicines are reviewed at length with the patient today.  Concerns regarding medicines are outlined above.   Tests Ordered:  None   Medication Changes: No orders of the defined types were placed in this encounter.   Disposition:  Follow up in a  Year if echo ok   Signed, Jenkins Rouge, MD  08/27/2019 9:35 AM    [facility]

## 2019-08-27 ENCOUNTER — Other Ambulatory Visit: Payer: Self-pay

## 2019-08-27 ENCOUNTER — Ambulatory Visit (INDEPENDENT_AMBULATORY_CARE_PROVIDER_SITE_OTHER): Payer: 59 | Admitting: Cardiovascular Disease

## 2019-08-27 VITALS — BP 110/68 | HR 60 | Ht 59.75 in | Wt 203.0 lb

## 2019-08-27 DIAGNOSIS — I480 Paroxysmal atrial fibrillation: Secondary | ICD-10-CM

## 2019-08-27 DIAGNOSIS — I420 Dilated cardiomyopathy: Secondary | ICD-10-CM | POA: Diagnosis not present

## 2019-08-27 NOTE — Patient Instructions (Signed)

## 2019-09-16 ENCOUNTER — Encounter: Payer: Self-pay | Admitting: Family Medicine

## 2019-09-16 ENCOUNTER — Ambulatory Visit (INDEPENDENT_AMBULATORY_CARE_PROVIDER_SITE_OTHER): Payer: 59 | Admitting: Family Medicine

## 2019-09-16 ENCOUNTER — Other Ambulatory Visit: Payer: Self-pay

## 2019-09-16 VITALS — BP 110/78 | HR 52 | Ht 59.0 in | Wt 200.0 lb

## 2019-09-16 DIAGNOSIS — F519 Sleep disorder not due to a substance or known physiological condition, unspecified: Secondary | ICD-10-CM

## 2019-09-16 DIAGNOSIS — Z6841 Body Mass Index (BMI) 40.0 and over, adult: Secondary | ICD-10-CM

## 2019-09-16 DIAGNOSIS — G4733 Obstructive sleep apnea (adult) (pediatric): Secondary | ICD-10-CM | POA: Diagnosis not present

## 2019-09-16 DIAGNOSIS — M79641 Pain in right hand: Secondary | ICD-10-CM | POA: Diagnosis not present

## 2019-09-16 DIAGNOSIS — Z0000 Encounter for general adult medical examination without abnormal findings: Secondary | ICD-10-CM | POA: Diagnosis not present

## 2019-09-16 DIAGNOSIS — M79642 Pain in left hand: Secondary | ICD-10-CM

## 2019-09-16 DIAGNOSIS — R42 Dizziness and giddiness: Secondary | ICD-10-CM

## 2019-09-16 DIAGNOSIS — I5022 Chronic systolic (congestive) heart failure: Secondary | ICD-10-CM

## 2019-09-16 DIAGNOSIS — F411 Generalized anxiety disorder: Secondary | ICD-10-CM

## 2019-09-16 DIAGNOSIS — I4891 Unspecified atrial fibrillation: Secondary | ICD-10-CM

## 2019-09-16 DIAGNOSIS — R0981 Nasal congestion: Secondary | ICD-10-CM

## 2019-09-16 DIAGNOSIS — Z9989 Dependence on other enabling machines and devices: Secondary | ICD-10-CM

## 2019-09-16 DIAGNOSIS — R7401 Elevation of levels of liver transaminase levels: Secondary | ICD-10-CM

## 2019-09-16 LAB — POCT GLYCOSYLATED HEMOGLOBIN (HGB A1C): Hemoglobin A1C: 5.8 % — AB (ref 4.0–5.6)

## 2019-09-16 NOTE — Progress Notes (Signed)
ROI filled out for [facility].  Faxed and placed in to be scanned pile. Christen Bame, CMA

## 2019-09-16 NOTE — Patient Instructions (Signed)
It was nice to meet you today!   Please stop taking the diclefenac tablets and the methocarbamine tablets.   It would be best to try coming off of the Ambien as this can interact with your other medications and cause many side effects, such as memory loss, confusion, agitation, hallucinations, mental/mood/behavrioal changes. It is not meant to be a long term medication as you have been taking it. Please start cutting the pills in half and take 2.5 mg nightly. While you begin to stop this medication, please continue your bed time routine as this will be helpful to help cue your body for sleep time. Continue to take the melatonin as you have.   Please follow up in 2 weeks to check in on sleeping.

## 2019-09-16 NOTE — Progress Notes (Addendum)
New Patient Visit Subjective:  CC: Establish care    HPI Sharon Cain is a 64 y.o. female who presents today to establish care. We have reviewed her medications and medical history and updates and plans as below in A&P.   HISTORY Reviewed with patient and updated in EMR as appropriate.   Allergies  Allergen Reactions  . Sulfa Antibiotics     Pancreatitis  . Amlodipine Swelling  . Lisinopril     Other reaction(s): Other  . Ramipril     Other reaction(s): Other    Current Outpatient Medications:  .  apixaban (ELIQUIS) 5 MG TABS tablet, Take 1 tablet (5 mg total) by mouth 2 (two) times daily., Disp: 180 tablet, Rfl: 1 .  atorvastatin (LIPITOR) 80 MG tablet, Take 0.5 tablets (40 mg total) by mouth daily., Disp: 90 tablet, Rfl: 3 .  busPIRone (BUSPAR) 10 MG tablet, Take 10 mg by mouth 3 (three) times daily as needed., Disp: , Rfl:  .  carvedilol (COREG) 6.25 MG tablet, Take 1 tablet (6.25 mg total) by mouth 2 (two) times daily with a meal. Please keep 08/27/2019 appointment for further refills, Disp: 180 tablet, Rfl: 0 .  escitalopram (LEXAPRO) 20 MG tablet, Take 20 mg by mouth daily., Disp: , Rfl:  .  furosemide (LASIX) 20 MG tablet, Take 20 mg by mouth daily., Disp: , Rfl:  .  isosorbide mononitrate (IMDUR) 30 MG 24 hr tablet, Take 0.5 tablets (15 mg total) by mouth daily. Please keep 08/27/2019 appointment for further refills, Disp: 45 tablet, Rfl: 0 .  meclizine (ANTIVERT) 12.5 MG tablet, Take 1 tablet by mouth as needed for dizziness., Disp: , Rfl:  .  ondansetron (ZOFRAN ODT) 4 MG disintegrating tablet, Take 1 tablet (4 mg total) by mouth every 8 (eight) hours as needed for nausea or vomiting., Disp: 20 tablet, Rfl: 0 .  zolpidem (AMBIEN) 5 MG tablet, Take 5 mg by mouth at bedtime as needed. , Disp: , Rfl: 0  Past Medical History:  Past Medical History:  Diagnosis Date  . A-fib ([place]) 2017  . Anxiety   . Arthritis   . Chest pain, central 11/15/2010   Patient admitted to  hospital for cardiac rule-out. No MI.    . Depression   . DEPRESSION 04/02/2007   Qualifier: Diagnosis of  By: Henderson Baltimore MD, Janett Billow    . Hyperlipidemia   . Hypertension   . Insomnia   . Obesity   . Tobacco abuse 03/30/2007   Qualifier: Diagnosis of  By: Buelah Manis MD, Lonell Grandchild     Past Surgical History:  Procedure Laterality Date  . CARPAL TUNNEL RELEASE Bilateral    bilateral  . CHOLECYSTECTOMY     removed before the age of 89  . RIGHT/LEFT HEART CATH AND CORONARY ANGIOGRAPHY N/A 07/30/2016   Procedure: Right/Left Heart Cath and Coronary Angiography;  Surgeon: Belva Crome, MD;  Location: [facility];  Service: Cardiovascular;  Laterality: N/A;   OB/Gyn History:   G3 P3003  LMP: No LMP recorded. Patient is postmenopausal.   Health Maintenance:   Health Maintenance  Topic Date Due  . Pap Smear  08/22/2015  . Mammogram  05/30/2017  . DEXA scan (bone density measurement)  Never done  . Pneumonia vaccines (1 of 2 - PCV13) 09/14/2019  . Flu Shot  10/24/2019  . Tetanus Vaccine  08/22/2022  . Colon Cancer Screening  09/15/2029  . COVID-19 Vaccine  Completed  .  Hepatitis C: One time screening is  recommended by Center for Disease Control  (CDC) for  adults born from [birth year] through 1966.   Completed  . HIV Screening  Completed   Eye doctor: No  Dentist: No    Family History Jenya's family history includes Atrial fibrillation in her mother; COPD in her father and mother; Congestive Heart Failure in her father; Diabetes in her mother; Heart failure in her mother; Sudden Cardiac Death in her father.   Social History  Ledia's  reports that she quit smoking about 3 years ago. Her smoking use included cigarettes. She smoked 1.00 pack per day. She has never used smokeless tobacco. She reports that she does not drink alcohol and does not use drugs..  Social History   Social History Narrative   Patient does not work.  She recently stopped working to take care of her husband and mother.   She reports a great relief in anxiety since leaving her job.  She currently lives with her husband [name], age 3) and her mother [name], 1) and five dogs. She owns a car.  She does not have any religious beliefs that would affect her health care.  She does not have an advanced directive.  She reports that her husband Corley Kohls would make decisions for her if she was unable to do so.  She does not exercise regularly.  Patient had previous tobacco use but quit in 2017.  No recreational drug use.  No alcohol use.  Not at risk of having sexually transmitted disease and feels safe in her relationship.  For fun, patient notes and crochets.    Objective:  Physical Exam:  BP 110/78   Pulse (!) 52   Ht 4\' 11"  (1.499 m)   Wt 200 lb (90.7 kg)   SpO2 98%   BMI 40.40 kg/m   Gen: NAD, alert, non-toxic, pleasant, elevated BMI HEENT: Normocephaic, atraumatic. PERRLA, clear conjuctiva, no scleral icterus and injection. Normal EOM.  Hearing intact. Neck supple with no LAD, nodules, or gross abnormality.  Nares patent with no discharge.  Oropharynx without erythema and lesions.  Tonsils nonswollen and without exudate.   CV: Heart sounds distant and difficult to hear. Normal capillary refill bilaterally.  Radial pulses 1+ bilaterally. No bilateral lower extremity edema. Resp: Clear to auscultation bilaterally.  No wheezing, rales, rhonchi, or other abnormal lung sounds.  No increased work of breathing appreciated. Abd: Nontender and nondistended on palpation to all 4 quadrants.  Positive bowel sounds. Skin: No obvious rashes, lesions, or trauma.  Normal turgor.  MSK: Normal ROM. Normal strength and tone.  Neuro: Cranial nerves II through VI grossly intact. Gait normal.  Alert and oriented x4.  No obvious abnormal movements. Psych: Cooperative with exam.  Normal speech. Pleasant. Makes good eye contact. Genitourinary: deferred.   Assessment & Plan:  Obstructive sleep apnea treated with continuous  positive airway pressure (CPAP) Uses CPAP nightly  Chronic systolic CHF (congestive heart failure) (HCC) Denies any shortness of breath, dyspnea on exertion. She follows with Dr. Johnsie Cancel.  Current medications include Imdur 15 mg once daily, atorvastatin 40 mg once daily, furosemide 20 mg once daily, carvedilol 6.25 mg twice daily  Atrial fibrillation (HCC) Patient reports compliance with 5 mg Eliquis twice daily  Nonorganic sleep disorder Patient currently taking 10 mg melatonin and 5 mg zolpidem nightly.  Patient has been on zolpidem for several years now.  I have explained to patient that this medication is not ideal for long-term use and that we should start weaning this  medication as able.  She is understanding and our plan is to decrease to half a tablet daily for 2 weeks.  In the meantime, patient should continue working on behavioral changes for bedtime routine and sleep hygiene.  Bilateral hand pain Patient reports that now that she is not working, her hands do feel better.  She has been using diclofenac 75 mg, 1 tab twice daily, topical diclofenac and methocarbamol 500 mg.  She reports that she has cut back on the methocarbamol and diclofenac pill greatly since stopping working.  Discontinued methocarbamol and oral diclofenac  BMI 40.0-44.9, adult ([place]) Will obtain lipid panel, CMP, hemoglobin A1c  Anxiety state Patient takes buspirone 15 mg once daily and takes an extra tablet as needed.  She also takes escitalopram 10 mg once daily.  She reports that her anxiety has much decreased since she has stopped working.  She will continue to take these medications.  Chronic nasal congestion Patient reports that she has been using: Phentermine at 4 mg once daily for congestion, though this does not really help relieve her symptoms.  Vertigo Technically takes meclizine 12.5 mg, 1 tablet 3 times daily as needed for vertigo.  She denies any recent falls.   Pap smear: Patient reports that  she had a Pap smear in 2019.  Will obtain records for our chart.  Health Maintenance Due  Topic Date Due  . PAP SMEAR-Modifier  08/22/2015  . MAMMOGRAM  05/30/2017  . DEXA SCAN  Never done  . PNA vac Low Risk Adult (1 of 2 - PCV13) 09/14/2019    Health Maintenance discussed with patient and patient agrees to address when able.   Follow up:  Future Appointments  Date Time Provider [place]  10/01/2019  9:30 AM [name], MD [facility]    [name], MD 09/20/19, 4:34 PM

## 2019-09-17 LAB — COMPREHENSIVE METABOLIC PANEL
ALT: 63 IU/L — ABNORMAL HIGH (ref 0–32)
AST: 21 IU/L (ref 0–40)
Albumin/Globulin Ratio: 1.8 (ref 1.2–2.2)
Albumin: 4.2 g/dL (ref 3.8–4.8)
Alkaline Phosphatase: 85 IU/L (ref 48–121)
BUN/Creatinine Ratio: 16 (ref 12–28)
BUN: 11 mg/dL (ref 8–27)
Bilirubin Total: 1.2 mg/dL (ref 0.0–1.2)
CO2: 24 mmol/L (ref 20–29)
Calcium: 9.3 mg/dL (ref 8.7–10.3)
Chloride: 108 mmol/L — ABNORMAL HIGH (ref 96–106)
Creatinine, Ser: 0.69 mg/dL (ref 0.57–1.00)
GFR calc Af Amer: 106 mL/min/{1.73_m2} (ref >59)
GFR calc non Af Amer: 92 mL/min/{1.73_m2} (ref >59)
Globulin, Total: 2.3 g/dL (ref 1.5–4.5)
Glucose: 98 mg/dL (ref 65–99)
Potassium: 4.2 mmol/L (ref 3.5–5.2)
Sodium: 143 mmol/L (ref 134–144)
Total Protein: 6.5 g/dL (ref 6.0–8.5)

## 2019-09-17 LAB — CBC
Hematocrit: 41.8 % (ref 34.0–46.6)
Hemoglobin: 13.8 g/dL (ref 11.1–15.9)
MCH: 30.6 pg (ref 26.6–33.0)
MCHC: 33 g/dL (ref 31.5–35.7)
MCV: 93 fL (ref 79–97)
Platelets: 169 10*3/uL (ref 150–450)
RBC: 4.51 x10E6/uL (ref 3.77–5.28)
RDW: 13.4 % (ref 11.7–15.4)
WBC: 5.2 10*3/uL (ref 3.4–10.8)

## 2019-09-17 LAB — LIPID PANEL
Chol/HDL Ratio: 3.2 ratio (ref 0.0–4.4)
Cholesterol, Total: 107 mg/dL (ref 100–199)
HDL: 33 mg/dL — ABNORMAL LOW (ref >39)
LDL Chol Calc (NIH): 44 mg/dL (ref 0–99)
Triglycerides: 181 mg/dL — ABNORMAL HIGH (ref 0–149)
VLDL Cholesterol Cal: 30 mg/dL (ref 5–40)

## 2019-09-17 LAB — HEPATITIS C ANTIBODY: Hep C Virus Ab: <0.1 s/co ratio (ref 0.0–0.9)

## 2019-09-17 LAB — HIV ANTIBODY (ROUTINE TESTING W REFLEX): HIV Screen 4th Generation wRfx: NONREACTIVE

## 2019-09-20 ENCOUNTER — Encounter: Payer: Self-pay | Admitting: Family Medicine

## 2019-09-20 DIAGNOSIS — M79641 Pain in right hand: Secondary | ICD-10-CM | POA: Insufficient documentation

## 2019-09-20 DIAGNOSIS — Z6841 Body Mass Index (BMI) 40.0 and over, adult: Secondary | ICD-10-CM | POA: Insufficient documentation

## 2019-09-20 DIAGNOSIS — M79642 Pain in left hand: Secondary | ICD-10-CM | POA: Insufficient documentation

## 2019-09-20 DIAGNOSIS — R42 Dizziness and giddiness: Secondary | ICD-10-CM | POA: Insufficient documentation

## 2019-09-20 DIAGNOSIS — R0981 Nasal congestion: Secondary | ICD-10-CM | POA: Insufficient documentation

## 2019-09-20 MED ORDER — ATORVASTATIN CALCIUM 80 MG PO TABS: 40.0000 mg | ORAL_TABLET | Freq: Every day | ORAL | 3 refills | Status: AC

## 2019-09-20 MED ORDER — CARVEDILOL 6.25 MG PO TABS: 6.2500 mg | ORAL_TABLET | Freq: Two times a day (BID) | ORAL | 0 refills | Status: DC

## 2019-09-20 MED ORDER — ISOSORBIDE MONONITRATE ER 30 MG PO TB24: 15.0000 mg | ORAL_TABLET | Freq: Every day | ORAL | 0 refills | Status: DC

## 2019-09-20 NOTE — Assessment & Plan Note (Signed)
Technically takes meclizine 12.5 mg, 1 tablet 3 times daily as needed for vertigo.  She denies any recent falls.

## 2019-09-20 NOTE — Addendum Note (Signed)
Addended by: [name] on: 09/20/2019 04:32 PM   Modules accepted: Orders

## 2019-09-20 NOTE — Assessment & Plan Note (Signed)
Patient takes buspirone 15 mg once daily and takes an extra tablet as needed.  She also takes escitalopram 10 mg once daily.  She reports that her anxiety has much decreased since she has stopped working.  She will continue to take these medications.

## 2019-09-20 NOTE — Assessment & Plan Note (Signed)
Patient reports that she has been using: Phentermine at 4 mg once daily for congestion, though this does not really help relieve her symptoms.

## 2019-09-20 NOTE — Assessment & Plan Note (Signed)
Patient currently taking 10 mg melatonin and 5 mg zolpidem nightly.  Patient has been on zolpidem for several years now.  I have explained to patient that this medication is not ideal for long-term use and that we should start weaning this medication as able.  She is understanding and our plan is to decrease to half a tablet daily for 2 weeks.  In the meantime, patient should continue working on behavioral changes for bedtime routine and sleep hygiene.

## 2019-09-20 NOTE — Assessment & Plan Note (Addendum)
Denies any shortness of breath, dyspnea on exertion. She follows with Dr. Johnsie Cancel.  Current medications include Imdur 15 mg once daily, atorvastatin 40 mg once daily, furosemide 20 mg once daily, carvedilol 6.25 mg twice daily

## 2019-09-20 NOTE — Assessment & Plan Note (Signed)
Patient reports compliance with 5 mg Eliquis twice daily

## 2019-09-20 NOTE — Assessment & Plan Note (Addendum)
Patient reports that now that she is not working, her hands do feel better.  She has been using diclofenac 75 mg, 1 tab twice daily, topical diclofenac and methocarbamol 500 mg.  She reports that she has cut back on the methocarbamol and diclofenac pill greatly since stopping working.  Discontinued methocarbamol and oral diclofenac

## 2019-09-20 NOTE — Assessment & Plan Note (Signed)
Will obtain lipid panel, CMP, hemoglobin A1c

## 2019-09-20 NOTE — Assessment & Plan Note (Signed)
Uses CPAP nightly 

## 2019-09-20 NOTE — Addendum Note (Signed)
Addended by: Owens Shark, Grey Schlauch on: 09/20/2019 06:45 PM   Modules accepted: Level of Service

## 2019-09-21 NOTE — Addendum Note (Signed)
Addended by: Zettie Cooley E on: 09/21/2019 12:08 PM   Modules accepted: Orders

## 2019-09-29 ENCOUNTER — Other Ambulatory Visit: Payer: 59

## 2019-09-29 ENCOUNTER — Other Ambulatory Visit: Payer: Self-pay

## 2019-09-29 DIAGNOSIS — R7401 Elevation of levels of liver transaminase levels: Secondary | ICD-10-CM

## 2019-09-30 LAB — HEPATIC FUNCTION PANEL
ALT: 66 IU/L — ABNORMAL HIGH (ref 0–32)
AST: 27 IU/L (ref 0–40)
Albumin: 4.2 g/dL (ref 3.8–4.8)
Alkaline Phosphatase: 84 IU/L (ref 48–121)
Bilirubin Total: 1.8 mg/dL — ABNORMAL HIGH (ref 0.0–1.2)
Bilirubin, Direct: 0.35 mg/dL (ref 0.00–0.40)
Total Protein: 6.6 g/dL (ref 6.0–8.5)

## 2019-10-01 ENCOUNTER — Other Ambulatory Visit: Payer: Self-pay

## 2019-10-01 ENCOUNTER — Encounter: Payer: Self-pay | Admitting: Family Medicine

## 2019-10-01 ENCOUNTER — Ambulatory Visit (INDEPENDENT_AMBULATORY_CARE_PROVIDER_SITE_OTHER): Payer: 59 | Admitting: Family Medicine

## 2019-10-01 VITALS — BP 106/60 | HR 62 | Ht 59.0 in | Wt 200.8 lb

## 2019-10-01 DIAGNOSIS — R7401 Elevation of levels of liver transaminase levels: Secondary | ICD-10-CM

## 2019-10-01 DIAGNOSIS — E782 Mixed hyperlipidemia: Secondary | ICD-10-CM

## 2019-10-01 DIAGNOSIS — F519 Sleep disorder not due to a substance or known physiological condition, unspecified: Secondary | ICD-10-CM | POA: Diagnosis not present

## 2019-10-01 DIAGNOSIS — Z6841 Body Mass Index (BMI) 40.0 and over, adult: Secondary | ICD-10-CM

## 2019-10-01 MED ORDER — ZOLPIDEM TARTRATE 5 MG PO TABS: 5.0000 mg | ORAL_TABLET | Freq: Every evening | ORAL | 1 refills | Status: DC | PRN

## 2019-10-01 NOTE — Assessment & Plan Note (Signed)
Continue atorvastatin 80 mg 

## 2019-10-01 NOTE — Assessment & Plan Note (Signed)
We will continue Ambien 5 mg as patient has been taking this since 2019 and has not been able to sleep with her half dose. She reports that she has been very irritable. She has a lot of stressors at home right now including taking care of her elderly mother and her husband who is transitioning to dialysis. We will continue Ambien and discuss this again in about 2 months to see how everything is going and if it is a good time to start cutting back on the Ambien.

## 2019-10-01 NOTE — Assessment & Plan Note (Signed)
Patient has diagnosis of fatty liver in the past. I do not see any recent imaging of her liver. Patient is status post cholecystectomy. No abdominal pain or jaundice. Patient would like to monitor this number and recheck in about 3 months rather than continue work-up. If number continues to elevate, will obtain RUQ to check for other liver abnormalities.

## 2019-10-01 NOTE — Patient Instructions (Signed)
I have refilled your Ambien. Lets talk again in about 2 months to discuss how everything is going.  We will also recheck you liver numbers in three months.

## 2019-10-01 NOTE — Progress Notes (Signed)
    SUBJECTIVE:   CHIEF COMPLAINT / HPI:   Difficulty sleeping  Patient reports that she has had extreme difficulty sleeping since cutting back on her Ambien to 2-1/2 mg. She had increased her melatonin to 20 mg without any benefit. 2 nights ago, she went back to her 5 mg of Ambien and 10 mg of melatonin and slept well. She is currently dealing with stressors at home which include: Taking care of her elderly mother and taking care of her husband who is in transition for dialysis currently. She reports that difficulty sleeping right now is likely due to not having the medication that she is used to. She has been on Lexapro for several years which she took for mood swings. She otherwise has a stable mood but has been irritable without the Ambien and without being for sleep.  Elevated ALT  ALT elevated on CMP recheck. Patient has been told that she has fatty liver in the past. She is status post cholecystectomy and does report multiple episodes of pancreatitis secondary to medication allergy in the past. She denies any abdominal pain, yellowing of her skin or eyes.  PERTINENT  PMH / PSH: Hyperlipidemia, obesity, nonorganic sleep disorder, A. fib, OSA, chronic systolic CHF  OBJECTIVE:   BP 106/60   Pulse 62   Ht 4\' 11"  (1.499 m)   Wt 200 lb 12.8 oz (91.1 kg)   SpO2 97%   BMI 40.56 kg/m   Well-appearing female, no acute distress. Sclera nonicteric with no jaundice. No pain on palpation to abdomen.  ASSESSMENT/PLAN:   Nonorganic sleep disorder We will continue Ambien 5 mg as patient has been taking this since 2019 and has not been able to sleep with her half dose. She reports that she has been very irritable. She has a lot of stressors at home right now including taking care of her elderly mother and her husband who is transitioning to dialysis. We will continue Ambien and discuss this again in about 2 months to see how everything is going and if it is a good time to start cutting back on the  Ambien.  HYPERLIPIDEMIA Continue atorvastatin 80 mg  OBESITY Working on a new diet for her and her husband as he is transitioning to dialysis and is diabetic. I hope that this will help her triglyceride numbers. Continue medication no change.  Elevated ALT measurement Patient has diagnosis of fatty liver in the past. I do not see any recent imaging of her liver. Patient is status post cholecystectomy. No abdominal pain or jaundice. Patient would like to monitor this number and recheck in about 3 months rather than continue work-up. If number continues to elevate, will obtain RUQ to check for other liver abnormalities.    [name], MD [place]

## 2019-10-01 NOTE — Assessment & Plan Note (Signed)
Working on a new diet for her and her husband as he is transitioning to dialysis and is diabetic. I hope that this will help her triglyceride numbers. Continue medication no change.

## 2019-10-18 ENCOUNTER — Other Ambulatory Visit: Payer: Self-pay | Admitting: Family Medicine

## 2019-10-18 DIAGNOSIS — E2839 Other primary ovarian failure: Secondary | ICD-10-CM

## 2019-10-18 DIAGNOSIS — R5381 Other malaise: Secondary | ICD-10-CM

## 2019-11-25 ENCOUNTER — Other Ambulatory Visit: Payer: Self-pay

## 2019-11-26 ENCOUNTER — Other Ambulatory Visit: Payer: Self-pay

## 2019-11-26 ENCOUNTER — Other Ambulatory Visit: Payer: Self-pay | Admitting: Pharmacist

## 2019-11-26 MED ORDER — CARVEDILOL 6.25 MG PO TABS
6.2500 mg | ORAL_TABLET | Freq: Two times a day (BID) | ORAL | 2 refills | Status: DC
Start: 2019-11-26 — End: 2022-03-04

## 2019-11-26 MED ORDER — APIXABAN 5 MG PO TABS: 5.0000 mg | ORAL_TABLET | Freq: Two times a day (BID) | ORAL | 1 refills | Status: DC

## 2019-11-26 NOTE — Telephone Encounter (Signed)
64yo Female 91.1kg Scr = 0.69 Last OV 08/27/2019

## 2019-11-30 MED ORDER — ISOSORBIDE MONONITRATE ER 30 MG PO TB24: 15.0000 mg | ORAL_TABLET | Freq: Every day | ORAL | 3 refills | Status: DC

## 2019-11-30 NOTE — Telephone Encounter (Signed)
Lasix - patient reports taking for fluid on her legs. Has been on it since CHF exacerbation 3-4 years ago. She has just continued taking it.  Suggested compression stockings, though she does report that, she is not working all day anymore either. Will discontinue lasix and follow up in clinic at patient's earliest convenience.

## 2019-12-02 ENCOUNTER — Telehealth: Payer: Self-pay

## 2019-12-02 NOTE — Telephone Encounter (Signed)
Received fax from [facility]:  "Per manufacturer, ISOSORBIDE [place] ER TAB 30 mg tablet cannot be split, crushed or chewed as this may alter the rate of drug release."   Rx sent was for 0.5 tabs by mouth daily. Please advise. Ottis Stain, CMA

## 2019-12-08 NOTE — Telephone Encounter (Signed)
Forwarding this to Dr. Johnsie Cancel, patient's cardiologist who prescribes this medication.   [name], M.D.  12:35 AM 12/08/2019

## 2019-12-08 NOTE — Telephone Encounter (Signed)
Can stop nitrates

## 2019-12-29 ENCOUNTER — Telehealth: Payer: Self-pay | Admitting: *Deleted

## 2019-12-29 ENCOUNTER — Other Ambulatory Visit: Payer: Self-pay | Admitting: Gastroenterology

## 2019-12-29 DIAGNOSIS — Z8679 Personal history of other diseases of the circulatory system: Secondary | ICD-10-CM | POA: Diagnosis not present

## 2019-12-29 DIAGNOSIS — I480 Paroxysmal atrial fibrillation: Secondary | ICD-10-CM | POA: Diagnosis not present

## 2019-12-29 DIAGNOSIS — R945 Abnormal results of liver function studies: Secondary | ICD-10-CM | POA: Diagnosis not present

## 2019-12-29 DIAGNOSIS — R7989 Other specified abnormal findings of blood chemistry: Secondary | ICD-10-CM

## 2019-12-29 DIAGNOSIS — Z1211 Encounter for screening for malignant neoplasm of colon: Secondary | ICD-10-CM | POA: Diagnosis not present

## 2019-12-29 NOTE — Telephone Encounter (Signed)
Patient with diagnosis of NVAF on Eliquis for anticoagulation.    Procedure: Colonoscopy Date of procedure: 01/26/20  CHADS2-VASc score of  4 (CHF, HTN, AGE, female)  CrCl 137.53 Platelet count 169  Per office protocol, patient can hold apixaban for 2 days days prior to procedure.   Patient Sharon Cain not need bridging with Lovenox (enoxaparin) around procedure as patient has low risk CHA2DS2VASc & no hx of stroke or DVT.

## 2019-12-29 NOTE — Telephone Encounter (Signed)
   [facility] Pre-operative Risk Assessment    HEARTCARE STAFF: - Please ensure there is not already an duplicate clearance open for this procedure. - Under Visit Info/Reason for Call, type in Other and utilize the format Clearance MM/DD/YY or Clearance TBD. Do not use dashes or single digits. - If request is for dental extraction, please clarify the # of teeth to be extracted.  Request for surgical clearance:  1. What type of surgery is being performed? COLONOSCOPY   2. When is this surgery scheduled? 01/26/20   3. What type of clearance is required (medical clearance vs. Pharmacy clearance to hold med vs. Both)? BOTH  4. Are there any medications that need to be held prior to surgery and how long? ELIQUIS x 2 DAYS PRIOR TO PROCEDURE   5. Practice name and name of physician performing surgery? [facility]; DR. Alessandra Bevels   6. What is the office phone number? [phone]   7.   What is the office fax number? [phone]  8.   Anesthesia type (None, local, MAC, general) ? PROPOFOL   Julaine Hua 12/29/2019, 3:08 PM  _________________________________________________________________   (provider comments below)

## 2019-12-30 NOTE — Telephone Encounter (Signed)
   Primary Cardiologist: Jenkins Rouge, MD  Chart reviewed as part of pre-operative protocol coverage. Given past medical history and time since last visit, based on ACC/AHA guidelines, Sharon Cain would be at acceptable risk for the planned procedure without further cardiovascular testing.   Patient with diagnosis of NVAF on Eliquis for anticoagulation.    Procedure: Colonoscopy Date of procedure: 01/26/20  CHADS2-VASc score of  4 (CHF, HTN, AGE, female)  CrCl 137.53 Platelet count 169  Per office protocol, patient can hold apixaban for 2 days days prior to procedure.   Patient will not need bridging with Lovenox (enoxaparin) around procedure as patient has low risk CHA2DS2VASc & no hx of stroke or DVT.    Patient was advised that if she develops new symptoms prior to surgery to contact our office to arrange a follow-up appointment.  He verbalized understanding.  I will route this recommendation to the requesting party via Epic fax function and remove from pre-op pool.  Please call with questions.  Jossie Ng. Leeya Rusconi NP-C    12/30/2019, 10:14 AM [address] Office [phone] Fax [phone]

## 2020-01-07 ENCOUNTER — Other Ambulatory Visit: Payer: Self-pay | Admitting: Gastroenterology

## 2020-01-07 ENCOUNTER — Ambulatory Visit
Admission: RE | Admit: 2020-01-07 | Discharge: 2020-01-07 | Disposition: A | Discharge status: Home / Self Care | Payer: 59 | Source: Ambulatory Visit | Attending: Gastroenterology | Admitting: Gastroenterology

## 2020-01-07 DIAGNOSIS — R7989 Other specified abnormal findings of blood chemistry: Secondary | ICD-10-CM

## 2020-01-07 DIAGNOSIS — R945 Abnormal results of liver function studies: Secondary | ICD-10-CM

## 2020-01-12 ENCOUNTER — Ambulatory Visit: Payer: 59

## 2020-01-12 ENCOUNTER — Other Ambulatory Visit: Payer: Self-pay

## 2020-01-12 ENCOUNTER — Ambulatory Visit
Admission: RE | Admit: 2020-01-12 | Discharge: 2020-01-12 | Disposition: A | Discharge status: Home / Self Care | Payer: 59 | Source: Ambulatory Visit | Attending: Family Medicine | Admitting: Family Medicine

## 2020-01-12 ENCOUNTER — Other Ambulatory Visit: Payer: 59

## 2020-01-12 DIAGNOSIS — M8589 Other specified disorders of bone density and structure, multiple sites: Secondary | ICD-10-CM | POA: Diagnosis not present

## 2020-01-12 DIAGNOSIS — Z780 Asymptomatic menopausal state: Secondary | ICD-10-CM | POA: Diagnosis not present

## 2020-01-12 DIAGNOSIS — E2839 Other primary ovarian failure: Secondary | ICD-10-CM

## 2020-01-21 DIAGNOSIS — Z1159 Encounter for screening for other viral diseases: Secondary | ICD-10-CM | POA: Diagnosis not present

## 2020-01-26 DIAGNOSIS — K5730 Diverticulosis of large intestine without perforation or abscess without bleeding: Secondary | ICD-10-CM | POA: Diagnosis not present

## 2020-01-26 DIAGNOSIS — K6289 Other specified diseases of anus and rectum: Secondary | ICD-10-CM | POA: Diagnosis not present

## 2020-01-26 DIAGNOSIS — Z1211 Encounter for screening for malignant neoplasm of colon: Secondary | ICD-10-CM | POA: Diagnosis not present

## 2020-01-26 DIAGNOSIS — D122 Benign neoplasm of ascending colon: Secondary | ICD-10-CM | POA: Diagnosis not present

## 2020-01-26 DIAGNOSIS — K648 Other hemorrhoids: Secondary | ICD-10-CM | POA: Diagnosis not present

## 2020-01-28 DIAGNOSIS — D122 Benign neoplasm of ascending colon: Secondary | ICD-10-CM | POA: Diagnosis not present

## 2020-02-11 DIAGNOSIS — D689 Coagulation defect, unspecified: Secondary | ICD-10-CM | POA: Diagnosis not present

## 2020-02-11 DIAGNOSIS — Z23 Encounter for immunization: Secondary | ICD-10-CM | POA: Diagnosis not present

## 2020-02-11 DIAGNOSIS — Z1159 Encounter for screening for other viral diseases: Secondary | ICD-10-CM | POA: Diagnosis not present

## 2020-02-11 DIAGNOSIS — I10 Essential (primary) hypertension: Secondary | ICD-10-CM | POA: Diagnosis not present

## 2020-02-11 DIAGNOSIS — Z79899 Other long term (current) drug therapy: Secondary | ICD-10-CM | POA: Diagnosis not present

## 2020-02-21 ENCOUNTER — Ambulatory Visit
Admission: RE | Admit: 2020-02-21 | Discharge: 2020-02-21 | Disposition: A | Discharge status: Home / Self Care | Payer: 59 | Source: Ambulatory Visit | Attending: Family Medicine | Admitting: Family Medicine

## 2020-02-21 ENCOUNTER — Other Ambulatory Visit: Payer: Self-pay

## 2020-02-21 DIAGNOSIS — Z1231 Encounter for screening mammogram for malignant neoplasm of breast: Secondary | ICD-10-CM | POA: Diagnosis not present

## 2020-02-21 DIAGNOSIS — Z0000 Encounter for general adult medical examination without abnormal findings: Secondary | ICD-10-CM

## 2020-03-03 DIAGNOSIS — G4733 Obstructive sleep apnea (adult) (pediatric): Secondary | ICD-10-CM | POA: Diagnosis not present

## 2020-03-03 DIAGNOSIS — R7303 Prediabetes: Secondary | ICD-10-CM | POA: Diagnosis not present

## 2020-03-03 DIAGNOSIS — Z79899 Other long term (current) drug therapy: Secondary | ICD-10-CM | POA: Diagnosis not present

## 2020-03-03 DIAGNOSIS — E785 Hyperlipidemia, unspecified: Secondary | ICD-10-CM | POA: Diagnosis not present

## 2020-03-06 ENCOUNTER — Other Ambulatory Visit: Payer: Self-pay | Admitting: Student

## 2020-03-24 ENCOUNTER — Other Ambulatory Visit: Payer: Self-pay | Admitting: Cardiovascular Disease

## 2020-03-27 NOTE — Telephone Encounter (Signed)
Pt's age 65, wt 91.1 kg, SCr 0.69, CrCl 116.9, last ov w/ PN 08/27/19.

## 2020-10-19 NOTE — Progress Notes (Signed)
Evaluation Performed:  Follow-up visit  Date:  10/25/2020   ID:  CATON WOHLWEND, DOB [birth year], MRN [id number]  PCP:  Cipriano Mile, NP  Cardiologist:  Johnsie Cancel Electrophysiologist:  None   Chief Complaint:  DCM  History of Present Illness:    Sharon Cain is a 65 y.o. female with a  istory of PAF, HTN, OSA on CPAP chronic LBBB Non ischemic DCM mild Normal cath 07/30/16 with EF 40-45%. Allergic to ACE and has leg pains with norvasc Maintained on beta blockers and eliquis Activity limited by chronic heel pain and recent right distal fibula avulsion fracture  No cardiac complaints Compliant with meds  Last echo a year ago 10/08/18 EF improved to 60-65%   I take care of her mom and saw her husband in hospital with volume overload And on dialysis now   She use to work at [employer]  Has some insomnia and fatty liver with previous pancreatitis post GB removal With elevated ALT  65 and TB 1.8 [place] only showed hepatic steatosis   Has some wheezing with activity uses her husbands albuterol inhaler    Past Medical History:  Diagnosis Date   A-fib ([place]) 2017   Anxiety    Arthritis    Chest pain, central 11/15/2010   Patient admitted to hospital for cardiac rule-out. No MI.     Depression    DEPRESSION 04/02/2007   Qualifier: Diagnosis of  By: Henderson Baltimore MD, Jessica     Hyperlipidemia    Hypertension    Insomnia    Obesity    Tobacco abuse 03/30/2007   Qualifier: Diagnosis of  By: Buelah Manis MD, Lonell Grandchild     Past Surgical History:  Procedure Laterality Date   CARPAL TUNNEL RELEASE Bilateral    bilateral   CHOLECYSTECTOMY     removed before the age of 18   RIGHT/LEFT HEART CATH AND CORONARY ANGIOGRAPHY N/A 07/30/2016   Procedure: Right/Left Heart Cath and Coronary Angiography;  Surgeon: Belva Crome, MD;  Location: [facility];  Service: Cardiovascular;  Laterality: N/A;     Current Meds  Medication Sig   atorvastatin (LIPITOR) 80 MG tablet Take 0.5 tablets (40 mg total) by  mouth daily.   busPIRone (BUSPAR) 10 MG tablet Take 10 mg by mouth 3 (three) times daily as needed.   calcium carbonate (CALCIUM 600) 600 MG TABS tablet Take 600 mg by mouth daily.   carvedilol (COREG) 6.25 MG tablet Take 1 tablet (6.25 mg total) by mouth 2 (two) times daily with a meal. Please keep 08/27/2019 appointment for further refills   ELIQUIS 5 MG TABS tablet TAKE 1 TABLET BY MOUTH  TWICE DAILY   escitalopram (LEXAPRO) 20 MG tablet Take 20 mg by mouth daily.   levothyroxine (SYNTHROID) 25 MCG tablet Take 25 mcg by mouth daily.   meclizine (ANTIVERT) 12.5 MG tablet Take 1 tablet by mouth as needed for dizziness.   ondansetron (ZOFRAN ODT) 4 MG disintegrating tablet Take 1 tablet (4 mg total) by mouth every 8 (eight) hours as needed for nausea or vomiting.     Allergies:   Sulfa antibiotics, Amlodipine, Lisinopril, and Ramipril   Social History   Tobacco Use   Smoking status: Former    Packs/day: 1.00    Types: Cigarettes    Quit date: 12/09/2015    Years since quitting: 4.8   Smokeless tobacco: Never  Vaping Use   Vaping Use: Never used  Substance Use Topics   Alcohol use: No  Alcohol/week: 0.0 standard drinks   Drug use: No     Family Hx: [name] includes Atrial fibrillation in her mother; COPD in her father and mother; Congestive Heart Failure in her father; Diabetes in her mother; Heart failure in her mother; Sudden Cardiac Death in her father.  ROS:   Please see the history of present illness.     All other systems reviewed and are negative.   Prior CV studies:   The following studies were reviewed today:  Cath 07/30/16  Echo 10/08/18  Labs/Other Tests and Data Reviewed:    EKG:   Sinus rhythm rate 54 LBBB chornic 07/28/16  08/27/19 SR rate 60 IVCD chronic T wave changes   Recent Labs: No results found for requested labs within last 8760 hours.   Recent Lipid Panel Lab Results  Component Value Date/Time   CHOL 107 09/16/2019 01:34 PM    TRIG 181 (H) 09/16/2019 01:34 PM   HDL 33 (L) 09/16/2019 01:34 PM   CHOLHDL 3.2 09/16/2019 01:34 PM   CHOLHDL 3.1 01/14/2016 09:08 AM   LDLCALC 44 09/16/2019 01:34 PM   LDLDIRECT 149 (H) 02/27/2010 08:15 PM    Wt Readings from Last 3 Encounters:  10/25/20 98.9 kg  10/01/19 91.1 kg  09/16/19 90.7 kg     Objective:    Vital Signs:  BP 104/62   Pulse 66   Ht '4\' 11"'$  (1.499 m)   Wt 98.9 kg   SpO2 97%   BMI 44.03 kg/m    Affect appropriate Overweight white female  HEENT: poor dentition  Neck supple with no adenopathy JVP normal no bruits no thyromegaly Lungs clear with no wheezing and good diaphragmatic motion Heart:  S1/S2 no murmur, no rub, gallop or click PMI normal Abdomen: benighn, BS positve, no tenderness, no AAA no bruit.  No HSM or HJR Distal pulses intact with no bruits No edema Neuro non-focal Skin warm and dry No muscular weakness   ASSESSMENT & PLAN:    DCM:  Functional class one improved  EF 60-65% 10/08/18 will update Since its been 2 years  LBBB chronic no high grade AV block yearly ECG PAF:  Maintaining NSR continue eliquis and beta blocker   Ortho: chronic heal pain new right fibula avulsion fracture   Asthma:  ? Exercise induced Albuterol inhaler called in   COVID-19 Education: The signs and symptoms of COVID-19 were discussed with the patient and how to seek care for testing (follow up with PCP or arrange E-visit).  The importance of social distancing was discussed today.     Medication Adjustments/Labs and Tests Ordered: Current medicines are reviewed at length with the patient today.  Concerns regarding medicines are outlined above.   Tests Ordered:  TTE for DCM  Medication Changes: No orders of the defined types were placed in this encounter.  Albuterol inhaler   Disposition:  Follow up in a  Year if echo ok   Signed, Jenkins Rouge, MD  10/25/2020 9:18 AM    [place]

## 2020-10-25 ENCOUNTER — Encounter: Payer: Self-pay | Admitting: Cardiovascular Disease

## 2020-10-25 ENCOUNTER — Ambulatory Visit: Payer: 59 | Admitting: Cardiovascular Disease

## 2020-10-25 ENCOUNTER — Other Ambulatory Visit: Payer: Self-pay

## 2020-10-25 VITALS — BP 104/62 | HR 66 | Ht 59.0 in | Wt 218.0 lb

## 2020-10-25 DIAGNOSIS — I447 Left bundle-branch block, unspecified: Secondary | ICD-10-CM | POA: Diagnosis not present

## 2020-10-25 DIAGNOSIS — I420 Dilated cardiomyopathy: Secondary | ICD-10-CM

## 2020-10-25 DIAGNOSIS — I480 Paroxysmal atrial fibrillation: Secondary | ICD-10-CM

## 2020-10-25 MED ORDER — ALBUTEROL SULFATE HFA 108 (90 BASE) MCG/ACT IN AERS: 2.0000 | INHALATION_SPRAY | Freq: Four times a day (QID) | RESPIRATORY_TRACT | 2 refills | Status: DC | PRN

## 2020-10-25 NOTE — Patient Instructions (Addendum)
Medication Instructions:  Your physician has recommended you make the following change in your medication:  1-TAKE Albuterol as directed   *If you need a refill on your cardiac medications before your next appointment, please call your pharmacy*  Lab Work: If you have labs (blood work) drawn today and your tests are completely normal, you will receive your results only by: [place] (if you have MyChart) OR A paper copy in the mail If you have any lab test that is abnormal or we need to change your treatment, we will call you to review the results.  Testing/Procedures: Your physician has requested that you have an echocardiogram. Echocardiography is a painless test that uses sound waves to create images of your heart. It provides your doctor with information about the size and shape of your heart and how well your heart's chambers and valves are working. This procedure takes approximately one hour. There are no restrictions for this procedure.  Follow-Up: At [facility], you and your health needs are our priority.  As part of our continuing mission to provide you with exceptional heart care, we have created designated Provider Care Teams.  These Care Teams include your primary Cardiologist (physician) and Advanced Practice Providers (APPs -  Physician Assistants and Nurse Practitioners) who all work together to provide you with the care you need, when you need it.  We recommend signing up for the patient portal called "MyChart".  Sign up information is provided on this After Visit Summary.  MyChart is used to connect with patients for Virtual Visits (Telemedicine).  Patients are able to view lab/test results, encounter notes, upcoming appointments, etc.  Non-urgent messages can be sent to your provider as well.   To learn more about what you can do with MyChart, go to [email or web address].    Your next appointment:   12 month(s)  The format for your next appointment:   In  Person  Provider:   You may see Jenkins Rouge, MD or one of the following Advanced Practice Providers on your designated Care Team:   Cecilie Kicks, NP

## 2020-11-13 ENCOUNTER — Other Ambulatory Visit: Payer: Self-pay

## 2020-11-13 ENCOUNTER — Ambulatory Visit (HOSPITAL_COMMUNITY): Payer: Medicare (Managed Care) | Attending: Cardiovascular Disease

## 2020-11-13 DIAGNOSIS — I447 Left bundle-branch block, unspecified: Secondary | ICD-10-CM

## 2020-11-13 DIAGNOSIS — I480 Paroxysmal atrial fibrillation: Secondary | ICD-10-CM | POA: Diagnosis not present

## 2020-11-13 DIAGNOSIS — I420 Dilated cardiomyopathy: Secondary | ICD-10-CM | POA: Insufficient documentation

## 2020-11-13 LAB — ECHOCARDIOGRAM COMPLETE
Area-P 1/2: 2.79 cm2
S' Lateral: 3.7 cm

## 2020-11-22 ENCOUNTER — Other Ambulatory Visit: Payer: Self-pay | Admitting: Family Medicine

## 2021-03-21 ENCOUNTER — Telehealth: Payer: Self-pay | Admitting: Cardiovascular Disease

## 2021-03-21 NOTE — Telephone Encounter (Signed)
Pt c/o medication issue:  1. Name of Medication: ELIQUIS 5 MG TABS tablet  2. How are you currently taking this medication (dosage and times per day)?   3. Are you having a reaction (difficulty breathing--STAT)? NO  4. What is your medication issue?  PT SAID SHE WOULD LIKE TO BE SWITCHED TO A DIFFERENT MEDICINE DUE TO ELIQUIS BEING SO EXPENSIVE $433.00 FOR A 30 DAY SUPPLY

## 2021-03-22 NOTE — Telephone Encounter (Signed)
Called patient about her message. Patient stated Eliquis is too expensive for her. Patient stated she has about 2 week supply. Informed patient about patient assistance program. Patient stated she is familiar with it and has some paper work. Will send message to our pre-auth nurse to help and call patient to guide her. Requested patient to talk to her insurance company as well to see what alternative medications they cover.

## 2021-03-22 NOTE — Telephone Encounter (Signed)
**Note De-Identified Johntay Doolen Obfuscation** No answer so I left a message on the pts VM asking her to call Jeani Hawking at Dr Kyla Balzarine office at [facility] at [phone].

## 2021-03-22 NOTE — Telephone Encounter (Signed)
**Note De-Identified Sharon Cain Obfuscation** I advised the pt that the only generic anticoagulant currently on the market is Warfarin which is the generic for Coumadin and that all others are name brand and will cost the same as Eliquis if not more.  She is aware of Warfarin and all that is involved while taking it as she states that her mother takes it.  She states that she is contacting [employer] to see if she is eligible for approval in their Eliquis asst program and if so she will request a application be mailed to her. If not, she is aware to call us back to discuss switching to Warfarin.  She thanked me for taking her call.

## 2021-03-27 ENCOUNTER — Telehealth: Payer: Self-pay

## 2021-03-27 NOTE — Telephone Encounter (Signed)
**Note De-Identified Zarin Knupp Obfuscation** The pt left her [employer] application for Eliquis at the office with documents. I have completed the providers page and have emailed all to Dr Kyla Balzarine nurse so she can obtain his signature, date it, and to fax all to [employer] at the fax number written on the cover letter included or to place in the to be faxed basket in medical records to be faxed.

## 2021-03-28 NOTE — Telephone Encounter (Signed)
Received forms, Dr. Johnsie Cancel signed, and faxed today.

## 2021-04-09 NOTE — Telephone Encounter (Signed)
**Note De-Identified Lavina Resor Obfuscation** Letter received from [facility] stating that they have approved the pt for Eliquis assistance until 03/24/2022. Pt ID: [id number]  The letter states that they have notified the pt of this approval as well.

## 2021-05-15 ENCOUNTER — Encounter (INDEPENDENT_AMBULATORY_CARE_PROVIDER_SITE_OTHER): Payer: Self-pay

## 2021-05-23 ENCOUNTER — Ambulatory Visit (INDEPENDENT_AMBULATORY_CARE_PROVIDER_SITE_OTHER): Payer: Medicare (Managed Care) | Admitting: Family Medicine

## 2021-06-06 ENCOUNTER — Ambulatory Visit (INDEPENDENT_AMBULATORY_CARE_PROVIDER_SITE_OTHER): Payer: Self-pay | Admitting: Family Medicine

## 2021-08-09 ENCOUNTER — Telehealth: Payer: Self-pay

## 2021-08-09 ENCOUNTER — Telehealth: Payer: Self-pay | Admitting: Cardiovascular Disease

## 2021-08-09 NOTE — Telephone Encounter (Signed)
   Pre-operative Risk Assessment    Patient Name: Sharon Cain  DOB: 12/28/1955 MRN: [id number]     Request for Surgical Clearance    Procedure:  Dental Extraction - Amount of Teeth to be Pulled:  10 with perioscaling at a later date  Date of Surgery:  Clearance TBD                                 Surgeon:  Not listed Surgeon's Group or Practice Name:  [facility] Phone number:  [phone] Fax number:  [phone]   Type of Clearance Requested:   - Medical  - Pharmacy:  Hold Apixaban (Eliquis)     Type of Anesthesia:   IV Sedation   Additional requests/questions:  Does this patient need antibiotics?  Signed, Kermitt Harjo   08/09/2021, 11:38 AM

## 2021-08-09 NOTE — Telephone Encounter (Signed)
Patient with diagnosis of afib on Eliquis for anticoagulation.    Procedure: 10 dental extractions Date of procedure: TBD  CHA2DS2-VASc Score = 4  This indicates a 4.8% annual risk of stroke. The patient's score is based upon: CHF History: 1 HTN History: 1 Diabetes History: 0 Stroke History: 0 Vascular Disease History: 0 Age Score: 1 Gender Score: 1   Has not had BMET or CBC checked since 08/2019. Requires updated labs.  Patient does not require pre-op antibiotics for dental procedure.  Assuming f/u labs are stable, patient will be able to hold Eliquis for 2 days prior to procedure.

## 2021-08-09 NOTE — Telephone Encounter (Signed)
Patient states she dropped off paperwork to the office yesterday and got a call yesterday evening. She states she is not sure if it was to tell her the paperwork is ready.

## 2021-08-09 NOTE — Telephone Encounter (Signed)
Form given to pre-op.

## 2021-08-10 NOTE — Telephone Encounter (Signed)
    Name: Sharon Cain  DOB: 1955-06-18  MRN: [id number]  Primary Cardiologist: Jenkins Rouge, MD - last [place] 10/2020   Preoperative team, please let pt know she needs CBC/BMET updated so we can adequately review how long to hold blood thinner. If you can get her to commit to a specific date she is coming in for labs, would go ahead and set up a phone call appointment for further preoperative risk assessment to occur 1-2 days after the labs. Please obtain consent and complete medication review. Thank you for your help.   Charlie Pitter, PA-C 08/10/2021, 10:21 AM [facility] [address] [place], [place] [address]

## 2021-08-13 ENCOUNTER — Telehealth: Payer: Self-pay | Admitting: *Deleted

## 2021-08-13 DIAGNOSIS — I480 Paroxysmal atrial fibrillation: Secondary | ICD-10-CM

## 2021-08-13 DIAGNOSIS — Z01818 Encounter for other preprocedural examination: Secondary | ICD-10-CM

## 2021-08-13 NOTE — Telephone Encounter (Signed)
Pt has been scheduled for lab work to be done 08/15/21, bmet/cbc. Pt then has been scheduled for a tele pre op appt 08/17/21 @ 11 am. Med rec and consent are done.

## 2021-08-13 NOTE — Telephone Encounter (Signed)
Pt has been scheduled for lab work to be done 08/15/21, bmet/cbc. Pt then has been scheduled for a tele pre op appt 08/17/21 @ 11 am. Med rec and consent are done.    Patient Consent for Virtual Visit        [name] has provided verbal consent on 08/13/2021 for a virtual visit (video or telephone).   CONSENT FOR VIRTUAL VISIT FOR:  Sharon Cain  By participating in this virtual visit I agree to the following:  I hereby voluntarily request, consent and authorize [facility] and its employed or contracted physicians, physician assistants, nurse practitioners or other licensed health care professionals (the Practitioner), to provide me with telemedicine health care services (the "Services") as deemed necessary by the treating Practitioner. I acknowledge and consent to receive the Services by the Practitioner via telemedicine. I understand that the telemedicine visit will involve communicating with the Practitioner through live audiovisual communication technology and the disclosure of certain medical information by electronic transmission. I acknowledge that I have been given the opportunity to request an in-person assessment or other available alternative prior to the telemedicine visit and am voluntarily participating in the telemedicine visit.  I understand that I have the right to withhold or withdraw my consent to the use of telemedicine in the course of my care at any time, without affecting my right to future care or treatment, and that the Practitioner or I may terminate the telemedicine visit at any time. I understand that I have the right to inspect all information obtained and/or recorded in the course of the telemedicine visit and may receive copies of available information for a reasonable fee.  I understand that some of the potential risks of receiving the Services via telemedicine include:  Delay or interruption in medical evaluation due to technological equipment failure or  disruption; Information transmitted may not be sufficient (e.g. poor resolution of images) to allow for appropriate medical decision making by the Practitioner; and/or  In rare instances, security protocols could fail, causing a breach of personal health information.  Furthermore, I acknowledge that it is my responsibility to provide information about my medical history, conditions and care that is complete and accurate to the best of my ability. I acknowledge that Practitioner's advice, recommendations, and/or decision may be based on factors not within their control, such as incomplete or inaccurate data provided by me or distortions of diagnostic images or specimens that may result from electronic transmissions. I understand that the practice of medicine is not an exact science and that Practitioner makes no warranties or guarantees regarding treatment outcomes. I acknowledge that a copy of this consent can be made available to me via my patient portal ([facility]), or I can request a printed copy by calling the office of [facility].    I understand that my insurance will be billed for this visit.   I have read or had this consent read to me. I understand the contents of this consent, which adequately explains the benefits and risks of the Services being provided via telemedicine.  I have been provided ample opportunity to ask questions regarding this consent and the Services and have had my questions answered to my satisfaction. I give my informed consent for the services to be provided through the use of telemedicine in my medical care

## 2021-08-15 ENCOUNTER — Other Ambulatory Visit: Payer: Medicare (Managed Care) | Admitting: *Deleted

## 2021-08-15 DIAGNOSIS — Z01818 Encounter for other preprocedural examination: Secondary | ICD-10-CM

## 2021-08-15 DIAGNOSIS — I480 Paroxysmal atrial fibrillation: Secondary | ICD-10-CM

## 2021-08-15 LAB — CBC
Hematocrit: 41.7 % (ref 34.0–46.6)
Hemoglobin: 14.2 g/dL (ref 11.1–15.9)
MCH: 31.3 pg (ref 26.6–33.0)
MCHC: 34.1 g/dL (ref 31.5–35.7)
MCV: 92 fL (ref 79–97)
Platelets: 154 10*3/uL (ref 150–450)
RBC: 4.53 x10E6/uL (ref 3.77–5.28)
RDW: 12.7 % (ref 11.7–15.4)
WBC: 4.3 10*3/uL (ref 3.4–10.8)

## 2021-08-15 LAB — BASIC METABOLIC PANEL
BUN/Creatinine Ratio: 15 (ref 12–28)
BUN: 9 mg/dL (ref 8–27)
CO2: 24 mmol/L (ref 20–29)
Calcium: 9.5 mg/dL (ref 8.7–10.3)
Chloride: 106 mmol/L (ref 96–106)
Creatinine, Ser: 0.61 mg/dL (ref 0.57–1.00)
Glucose: 138 mg/dL — ABNORMAL HIGH (ref 70–99)
Potassium: 4 mmol/L (ref 3.5–5.2)
Sodium: 142 mmol/L (ref 134–144)
eGFR: 99 mL/min/{1.73_m2} (ref >59)

## 2021-08-17 ENCOUNTER — Ambulatory Visit (INDEPENDENT_AMBULATORY_CARE_PROVIDER_SITE_OTHER): Payer: Medicare (Managed Care) | Admitting: General Practice

## 2021-08-17 DIAGNOSIS — Z01810 Encounter for preprocedural cardiovascular examination: Secondary | ICD-10-CM

## 2021-08-17 NOTE — Progress Notes (Signed)
Virtual Visit via Telephone Note   Because of Sharon Cain's co-morbid illnesses, she is at least at moderate risk for complications without adequate follow up.  This format is felt to be most appropriate for this patient at this time.  The patient did not have access to video technology/had technical difficulties with video requiring transitioning to audio format only (telephone).  All issues noted in this document were discussed and addressed.  No physical exam could be performed with this format.  Please refer to the patient's chart for her consent to telehealth for [facility].  Evaluation Performed:  Preoperative cardiovascular risk assessment _____________   Date:  08/17/2021   Patient ID:  Sharon Cain, DOB 1955-05-14, MRN [id number] Patient Location:  Home Provider location:   Office  Primary Care Provider:  Cipriano Mile, NP Primary Cardiologist:  Jenkins Rouge, MD  Chief Complaint / Patient Profile   66 y.o. y/o female with a h/o dilated cardiomyopathy, LBBB, paroxysmal atrial fibrillation who is pending dental extraction of 10 teeth and presents today for telephonic preoperative cardiovascular risk assessment.  Past Medical History    Past Medical History:  Diagnosis Date   A-fib [facility]) 2017   Anxiety    Arthritis    Chest pain, central 11/15/2010   Patient admitted to hospital for cardiac rule-out. No MI.     Depression    DEPRESSION 04/02/2007   Qualifier: Diagnosis of  By: Henderson Baltimore MD, Jessica     Hyperlipidemia    Hypertension    Insomnia    Obesity    Tobacco abuse 03/30/2007   Qualifier: Diagnosis of  By: Buelah Manis MD, Lonell Grandchild     Past Surgical History:  Procedure Laterality Date   CARPAL TUNNEL RELEASE Bilateral    bilateral   CHOLECYSTECTOMY     removed before the age of 89   RIGHT/LEFT HEART CATH AND CORONARY ANGIOGRAPHY N/A 07/30/2016   Procedure: Right/Left Heart Cath and Coronary Angiography;  Surgeon: Belva Crome, MD;  Location: [place]  [facility];  Service: Cardiovascular;  Laterality: N/A;    Allergies  Allergies  Allergen Reactions   Sulfa Antibiotics     Pancreatitis   Amlodipine Swelling   Lisinopril     Other reaction(s): Other   Ramipril     Other reaction(s): Other    History of Present Illness    Sharon Cain is a 66 y.o. female who presents via audio/video conferencing for a telehealth visit today.  Pt was last seen in cardiology clinic on 10/25/2020 by Dr. Johnsie Cancel.  At that time Sharon Cain was doing well .  The patient is now pending procedure as outlined above. Since her last visit, she remained stable from a cardiac standpoint.  Today she denies chest pain, shortness of breath, lower extremity edema, fatigue, palpitations, melena, hematuria, hemoptysis, diaphoresis, weakness, presyncope, syncope, orthopnea, and PND.  Home Medications    Prior to Admission medications   Medication Sig Start Date End Date Taking? Authorizing Provider  albuterol (VENTOLIN HFA) 108 (90 Base) MCG/ACT inhaler Inhale 2 puffs into the lungs every 6 (six) hours as needed for wheezing or shortness of breath. 10/25/20   Josue Hector, MD  atorvastatin (LIPITOR) 80 MG tablet Take 0.5 tablets (40 mg total) by mouth daily. 09/20/19   [name], MD  busPIRone (BUSPAR) 10 MG tablet Take 10 mg by mouth 3 (three) times daily as needed.    [provider]  calcium carbonate (OS-CAL) 600 MG  TABS tablet Take 600 mg by mouth daily.    [provider]  carvedilol (COREG) 6.25 MG tablet Take 1 tablet (6.25 mg total) by mouth 2 (two) times daily with a meal. Please keep 08/27/2019 appointment for further refills 11/26/19   Josue Hector, MD  ELIQUIS 5 MG TABS tablet TAKE 1 TABLET BY MOUTH  TWICE DAILY 03/27/20   Josue Hector, MD  escitalopram (LEXAPRO) 20 MG tablet Take 20 mg by mouth daily.    [provider]  levothyroxine (SYNTHROID) 25 MCG tablet Take 25 mcg by mouth daily. 09/07/20   [provider]   meclizine (ANTIVERT) 12.5 MG tablet Take 1 tablet by mouth as needed for dizziness. 06/30/18   [provider]  ondansetron (ZOFRAN ODT) 4 MG disintegrating tablet Take 1 tablet (4 mg total) by mouth every 8 (eight) hours as needed for nausea or vomiting. 07/27/16   Ward, Delice Bison, DO    Physical Exam    Vital Signs:  Sharon Cain does not have vital signs available for review today.  Given telephonic nature of communication, physical exam is limited. AAOx3. NAD. Normal affect.  Speech and respirations are unlabored.  Accessory Clinical Findings    None  Assessment & Plan    1.  Preoperative Cardiovascular Risk Assessment:     Primary Cardiologist: Jenkins Rouge, MD  Chart reviewed as part of pre-operative protocol coverage. Given past medical history and time since last visit, based on ACC/AHA guidelines, Sharon Cain would be at acceptable risk for the planned procedure without further cardiovascular testing.   Patient was advised that if she develops new symptoms prior to surgery to contact our office to arrange a follow-up appointment.  He verbalized understanding.   Patient with diagnosis of afib on Eliquis for anticoagulation.     Procedure: 10 dental extractions Date of procedure: TBD   CHA2DS2-VASc Score = 4  This indicates a 4.8% annual risk of stroke. The patient's score is based upon: CHF History: 1 HTN History: 1 Diabetes History: 0 Stroke History: 0 Vascular Disease History: 0 Age Score: 1 Gender Score: 1     Patient does not require pre-op antibiotics for dental procedure.   She may hold Eliquis for 2 days prior to procedure.  Please resume as soon as hemostasis is achieved.    A copy of this note will be routed to requesting surgeon.  Time:   Today, I have spent 7 minutes with the patient with telehealth technology discussing medical history, symptoms, and management plan.  Prior to her phone evaluation I spent greater than 10  minutes reviewing her medications and past medical history.   Deberah Pelton, NP  08/17/2021, 7:31 AM

## 2021-08-21 ENCOUNTER — Telehealth: Payer: Self-pay | Admitting: General Practice

## 2021-08-21 NOTE — Telephone Encounter (Signed)
Routed to [employer] (covering for J. Cleaver NP) who patient saw on 5/26

## 2021-08-21 NOTE — Telephone Encounter (Signed)
Patient called stating she had left a form here on Friday when she was seen to be signed.  She called [facility] and they still haven't received it.

## 2021-08-22 NOTE — Telephone Encounter (Signed)
This was already faxed via EPIC fax function 08-17-2021. Sent again via EPIC fax function Pt informed of the above, verbalized understanding. She states that she will call to verify receipt/make appointment.

## 2021-10-31 ENCOUNTER — Encounter (INDEPENDENT_AMBULATORY_CARE_PROVIDER_SITE_OTHER): Payer: Self-pay

## 2021-11-28 NOTE — Progress Notes (Signed)
Synopsis: Referred for COPD by Cipriano Mile, NP  Subjective:   PATIENT ID: Sharon Cain GENDER: female DOB: May 24, 1955, MRN: [id number]  Chief Complaint  Patient presents with   Pulmonary Consult    Referred by Cipriano Mile, NP. Pt c/o DOE over the past 2 years, progressively getting worse. She gets winded walking and carrying things, walking up an incline. She has some occ cough- prod with clear sputum.    24yF with history of AF, anxiety, MDD, HTN, smoking referred for COPD  She says that she's had worsening DOE with even light activity over last couple of years. She has minor cough, occasionally productive. Does have sinus congestion and postnasal drainage. Has never had PFTs. Has been off of breo for several months, didn't find it helpful. She can't remember if a recent course of prednisone this year was helpful for her dypsnea. She doesn't take singulair. She uses flonase on and off. She has gained about 20 lb this year. No orthopnea. No LE swelling.   She has never had allergy testing.   She has OSA and uses CPAP.  Otherwise pertinent review of systems is negative.  Her mother has COPD, Father with emphysema  She worked in [employer], Scientist, research (medical). No concerning dust/solvent/particulate exposures. Does have 5 dogs at home. Smoked off and on for 30-40 years. 1ppd. Quit 7ya.   Past Medical History:  Diagnosis Date   A-fib [facility]) 2017   Anxiety    Arthritis    Chest pain, central 11/15/2010   Patient admitted to hospital for cardiac rule-out. No MI.     Depression    DEPRESSION 04/02/2007   Qualifier: Diagnosis of  By: Henderson Baltimore MD, Jessica     Hyperlipidemia    Hypertension    Insomnia    Obesity    Tobacco abuse 03/30/2007   Qualifier: Diagnosis of  By: Buelah Manis MD, Lonell Grandchild       Family History  Problem Relation Age of Onset   Atrial fibrillation Mother    Diabetes Mother    Heart failure Mother    COPD Mother    COPD Father    Congestive Heart Failure Father     Sudden Cardiac Death Father      Past Surgical History:  Procedure Laterality Date   CARPAL TUNNEL RELEASE Bilateral    bilateral   CHOLECYSTECTOMY     removed before the age of 82   RIGHT/LEFT HEART CATH AND CORONARY ANGIOGRAPHY N/A 07/30/2016   Procedure: Right/Left Heart Cath and Coronary Angiography;  Surgeon: Belva Crome, MD;  Location: [facility];  Service: Cardiovascular;  Laterality: N/A;    Social History   Socioeconomic History   Marital status: Married    Spouse name: Not on file   Number of children: Not on file   Years of education: Not on file   Highest education level: Not on file  Occupational History   Not on file  Tobacco Use   Smoking status: Former    Packs/day: 1.00    Years: 40.00    Total pack years: 40.00    Types: Cigarettes    Quit date: 12/09/2015    Years since quitting: 5.9    Passive exposure: Past   Smokeless tobacco: Never  Vaping Use   Vaping Use: Never used  Substance and Sexual Activity   Alcohol use: No    Alcohol/week: 0.0 standard drinks of alcohol   Drug use: No   Sexual activity: Not on file  Other Topics Concern   Not on file  Social History Narrative   Patient does not work.  She recently stopped working to take care of her husband and mother.  She reports a great relief in anxiety since leaving her job.  She currently lives with her husband [name], age 52) and her mother [name], 92) and five dogs. She owns a car.  She does not have any religious beliefs that would affect her health care.  She does not have an advanced directive.  She reports that her husband Judy Pollman would make decisions for her if she was unable to do so.  She does not exercise regularly.  Patient had previous tobacco use but quit in 2017.  No recreational drug use.  No alcohol use.  Not at risk of having sexually transmitted disease and feels safe in her relationship.  For fun, patient notes and crochets.   Social Determinants of Health    Financial Resource Strain: Not on file  Food Insecurity: Not on file  Transportation Needs: Not on file  Physical Activity: Not on file  Stress: Not on file  Social Connections: Not on file  Intimate Partner Violence: Not on file     Allergies  Allergen Reactions   Sulfa Antibiotics     Pancreatitis   Amlodipine Swelling   Lisinopril     Other reaction(s): Other   Ramipril     Other reaction(s): Other     Outpatient Medications Prior to Visit  Medication Sig Dispense Refill   atorvastatin (LIPITOR) 80 MG tablet Take 0.5 tablets (40 mg total) by mouth daily. 90 tablet 3   Black Cohosh 200 MG CAPS Take 1 capsule by mouth daily.     busPIRone (BUSPAR) 10 MG tablet Take 10 mg by mouth 3 (three) times daily as needed.     calcium carbonate (OS-CAL) 600 MG TABS tablet Take 600 mg by mouth daily.     carvedilol (COREG) 6.25 MG tablet Take 1 tablet (6.25 mg total) by mouth 2 (two) times daily with a meal. Please keep 08/27/2019 appointment for further refills 180 tablet 2   ELIQUIS 5 MG TABS tablet TAKE 1 TABLET BY MOUTH  TWICE DAILY 180 tablet 1   escitalopram (LEXAPRO) 20 MG tablet Take 20 mg by mouth daily.     levothyroxine (SYNTHROID) 25 MCG tablet Take 25 mcg by mouth daily.     meclizine (ANTIVERT) 12.5 MG tablet Take 1 tablet by mouth as needed for dizziness.     Melatonin 10 MG TABS Take 20 mg by mouth at bedtime as needed.     montelukast (SINGULAIR) 10 MG tablet Take 10 mg by mouth at bedtime.     ondansetron (ZOFRAN ODT) 4 MG disintegrating tablet Take 1 tablet (4 mg total) by mouth every 8 (eight) hours as needed for nausea or vomiting. 20 tablet 0   oxybutynin (DITROPAN) 5 MG tablet Take 5 mg by mouth daily.     albuterol (VENTOLIN HFA) 108 (90 Base) MCG/ACT inhaler Inhale 2 puffs into the lungs every 6 (six) hours as needed for wheezing or shortness of breath. 8 g 2   No facility-administered medications prior to visit.       Objective:   Physical  Exam:  General appearance: 66 y.o., female, NAD, conversant  Eyes: anicteric sclerae; PERRL, tracking appropriately HENT: NCAT; MMM Neck: Trachea midline; no lymphadenopathy, no JVD Lungs: CTAB, no crackles, no wheeze, with normal respiratory effort CV: RRR, no murmur  Abdomen:  Soft, non-tender; non-distended, BS present  Extremities: No peripheral edema, warm Skin: Normal turgor and texture; no rash Psych: Appropriate affect Neuro: Alert and oriented to person and place, no focal deficit     Vitals:   11/30/21 1401  BP: 112/68  Pulse: 60  Temp: 98.8 F (37.1 C)  TempSrc: Oral  SpO2: 96%  Weight: 218 lb 9.6 oz (99.2 kg)  Height: 4' 11.5" (1.511 m)   96% on RA BMI Readings from Last 3 Encounters:  11/30/21 43.41 kg/m  10/25/20 44.03 kg/m  10/01/19 40.56 kg/m   Wt Readings from Last 3 Encounters:  11/30/21 218 lb 9.6 oz (99.2 kg)  10/25/20 218 lb (98.9 kg)  10/01/19 200 lb 12.8 oz (91.1 kg)     CBC    Component Value Date/Time   WBC 4.3 08/15/2021 1145   WBC 7.0 07/27/2016 0239   RBC 4.53 08/15/2021 1145   RBC 4.47 07/27/2016 0239   HGB 14.2 08/15/2021 1145   HCT 41.7 08/15/2021 1145   PLT 154 08/15/2021 1145   MCV 92 08/15/2021 1145   MCH 31.3 08/15/2021 1145   MCH 30.6 07/27/2016 0239   MCHC 34.1 08/15/2021 1145   MCHC 32.9 07/27/2016 0239   RDW 12.7 08/15/2021 1145   LYMPHSABS 2.1 07/26/2016 1620   MONOABS 0.6 05/24/2010 1804   EOSABS 0.2 07/26/2016 1620   BASOSABS 0.0 07/26/2016 1620     Chest Imaging: CT Chest 2019 reviewed by me with centrilobular and paraseptal emphysema, stable nodules  Pulmonary Functions Testing Results:    Latest Ref Rng & Units 06/17/2017   10:57 AM  PFT Results  FVC-Pre L 2.21   FVC-Predicted Pre % 80   FVC-Post L 2.14   FVC-Predicted Post % 77   Pre FEV1/FVC % % 83   Post FEV1/FCV % % 84   FEV1-Pre L 1.83   FEV1-Predicted Pre % 86   FEV1-Post L 1.81   DLCO uncorrected ml/min/mmHg 16.50   DLCO UNC% % 87    DLVA Predicted % 108   TLC L 3.72   TLC % Predicted % 83   RV % Predicted % 73    PFT 2019 reviewed by me with borderline obstruction by FEV1/SVC, mild, otherwise normal   Echocardiogram:   TTE 10/2020:  1. Frequent PVCs during study.   2. Apical window is somewhat foreshortened Basal inferior hypokinesis  with mild aneurysmal dilitation. NO significant change from echo of  10/07/18.Marland Kitchen Left ventricular ejection fraction, by estimation, is 55 to 60%.  The left ventricle has normal function.  The left ventricular internal cavity size was moderately dilated. Left  ventricular diastolic parameters are consistent with Grade II diastolic  dysfunction (pseudonormalization). Elevated left atrial pressure.   3. Right ventricular systolic function is normal. The right ventricular  size is normal. There is normal pulmonary artery systolic pressure.   4. Left atrial size was mild to moderately dilated.   5. The pericardial effusion is circumferential.   6. Mild mitral valve regurgitation.   7. The aortic valve is normal in structure. Aortic valve regurgitation is  not visualized.   8. The inferior vena cava is normal in size with greater than 50%  respiratory variability, suggesting right atrial pressure of 3 mmHg.      Assessment & Plan:   # DOE # Emphysema # Suspected COPD gold B Not sure if her increased DOE is due to worsening lung function, deconditioning, diastolic dysfunction (she does have G2DD and weight is up this  year though she doesn't look extravascularly volume overloaded).  # OSA on CPAP  # Chronic rhinosinusitis  # History of smoking Does LDCT screening, last was last week at [place]: - Please request copy of disc with CT chest on it from [facility] to bring to next clinic visit - Start stiolto 2 puffs daily - ideally stop this for a few days before your PFT and would instead lean on albuterol inhaler for those few days. Just can't use albuterol on day of PFTs.  -  Take shower, clear nose of crusting and then flonase 1 spray each nostril - See you in a few months or sooner if need be!     Sharon Hurter, MD [facility] 11/30/2021 2:14 PM

## 2021-11-30 ENCOUNTER — Ambulatory Visit (INDEPENDENT_AMBULATORY_CARE_PROVIDER_SITE_OTHER): Payer: Medicare (Managed Care) | Admitting: Student

## 2021-11-30 ENCOUNTER — Encounter: Payer: Self-pay | Admitting: Student

## 2021-11-30 ENCOUNTER — Telehealth: Payer: Self-pay | Admitting: Student

## 2021-11-30 VITALS — BP 112/68 | HR 60 | Temp 98.8°F | Ht 59.5 in | Wt 218.6 lb

## 2021-11-30 DIAGNOSIS — G4733 Obstructive sleep apnea (adult) (pediatric): Secondary | ICD-10-CM

## 2021-11-30 DIAGNOSIS — J449 Chronic obstructive pulmonary disease, unspecified: Secondary | ICD-10-CM | POA: Diagnosis not present

## 2021-11-30 DIAGNOSIS — Z9989 Dependence on other enabling machines and devices: Secondary | ICD-10-CM | POA: Diagnosis not present

## 2021-11-30 MED ORDER — STIOLTO RESPIMAT 2.5-2.5 MCG/ACT IN AERS: 2.0000 | INHALATION_SPRAY | Freq: Every day | RESPIRATORY_TRACT | 0 refills | Status: DC

## 2021-11-30 NOTE — Patient Instructions (Signed)
-   Please request copy of disc with CT chest on it from [facility] to bring to next clinic visit - Start stiolto 2 puffs daily - ideally stop this for a few days before your PFT and would instead lean on albuterol inhaler for those few days. Just can't use albuterol on day of PFTs.  - Take shower, clear nose of crusting and then flonase 1 spray each nostril - See you in a few months or sooner if need be!

## 2021-11-30 NOTE — Telephone Encounter (Signed)
What is least expensvei laba/lama for her?  Thanks!

## 2021-12-03 ENCOUNTER — Other Ambulatory Visit (HOSPITAL_COMMUNITY): Payer: Self-pay

## 2021-12-03 ENCOUNTER — Ambulatory Visit: Payer: Medicare (Managed Care) | Admitting: Physician Assistant

## 2021-12-03 MED ORDER — ANORO ELLIPTA 62.5-25 MCG/ACT IN AEPB: 1.0000 | INHALATION_SPRAY | Freq: Every day | RESPIRATORY_TRACT | 11 refills | Status: DC

## 2021-12-03 NOTE — Telephone Encounter (Signed)
Anoro preferred, [facility] for anoro 1 puff once daily

## 2021-12-03 NOTE — Progress Notes (Deleted)
Cardiology Office Note:    Date:  12/03/2021   ID:  [name], DOB 05/24/1955, MRN [id number]  PCP:  Cipriano Mile, NP  [facility] Cardiologist:  Jenkins Rouge, MD  [place] Electrophysiologist:  None   Chief Complaint: yearly follow up   History of Present Illness:    ALMA MOHIUDDIN is a 66 y.o. female with a hx of  NICM with improved LVEF, LBBB, paroxysmal atrial fibrillation, HTN, OSA on CPAP seen for follow up.   Hx of NICM. Normal cath in 07/2016 was normal. Improved EF to 60-65% in 09/2018.   Last echo 10/2020: LVEF 55-60%, grade II DD, and mild MR. Frequent PVCS noted during study  Here today for follow up.   Past Medical History:  Diagnosis Date   A-fib [facility]) 2017   Anxiety    Arthritis    Chest pain, central 11/15/2010   Patient admitted to hospital for cardiac rule-out. No MI.     Depression    DEPRESSION 04/02/2007   Qualifier: Diagnosis of  By: Henderson Baltimore MD, Jessica     Hyperlipidemia    Hypertension    Insomnia    Obesity    Tobacco abuse 03/30/2007   Qualifier: Diagnosis of  By: Buelah Manis MD, Lonell Grandchild      Past Surgical History:  Procedure Laterality Date   CARPAL TUNNEL RELEASE Bilateral    bilateral   CHOLECYSTECTOMY     removed before the age of 22   RIGHT/LEFT HEART CATH AND CORONARY ANGIOGRAPHY N/A 07/30/2016   Procedure: Right/Left Heart Cath and Coronary Angiography;  Surgeon: Belva Crome, MD;  Location: [facility];  Service: Cardiovascular;  Laterality: N/A;    Current Medications: No outpatient medications have been marked as taking for the 12/03/21 encounter (Appointment) with Leanor Kail, [place].     Allergies:   Sulfa antibiotics, Amlodipine, Lisinopril, and Ramipril   Social History   Socioeconomic History   Marital status: Married    Spouse name: Not on file   Number of children: Not on file   Years of education: Not on file   Highest education level: Not on file  Occupational History   Not on file  Tobacco  Use   Smoking status: Former    Packs/day: 1.00    Years: 40.00    Total pack years: 40.00    Types: Cigarettes    Quit date: 12/09/2015    Years since quitting: 5.9    Passive exposure: Past   Smokeless tobacco: Never  Vaping Use   Vaping Use: Never used  Substance and Sexual Activity   Alcohol use: No    Alcohol/week: 0.0 standard drinks of alcohol   Drug use: No   Sexual activity: Not on file  Other Topics Concern   Not on file  Social History Narrative   Patient does not work.  She recently stopped working to take care of her husband and mother.  She reports a great relief in anxiety since leaving her job.  She currently lives with her husband [name], age 70) and her mother [name], 14) and five dogs. She owns a car.  She does not have any religious beliefs that would affect her health care.  She does not have an advanced directive.  She reports that her husband Reynalda Canny would make decisions for her if she was unable to do so.  She does not exercise regularly.  Patient had previous tobacco use but quit in 2017.  No recreational  drug use.  No alcohol use.  Not at risk of having sexually transmitted disease and feels safe in her relationship.  For fun, patient notes and crochets.   Social Determinants of Health   Financial Resource Strain: Not on file  Food Insecurity: Not on file  Transportation Needs: Not on file  Physical Activity: Not on file  Stress: Not on file  Social Connections: Not on file     Family History: [name] includes Atrial fibrillation in her mother; COPD in her father and mother; Congestive Heart Failure in her father; Diabetes in her mother; Heart failure in her mother; Sudden Cardiac Death in her father.  ***  ROS:   Please see the history of present illness.    All other systems reviewed and are negative. ***  EKGs/Labs/Other Studies Reviewed:    The following studies were reviewed today:  Echo 10/2020  1. Frequent  PVCs during study.   2. Apical window is somewhat foreshortened Basal inferior hypokinesis  with mild aneurysmal dilitation. NO significant change from echo of  10/07/18.Marland Kitchen Left ventricular ejection fraction, by estimation, is 55 to 60%.  The left ventricle has normal function.  The left ventricular internal cavity size was moderately dilated. Left  ventricular diastolic parameters are consistent with Grade II diastolic  dysfunction (pseudonormalization). Elevated left atrial pressure.   3. Right ventricular systolic function is normal. The right ventricular  size is normal. There is normal pulmonary artery systolic pressure.   4. Left atrial size was mild to moderately dilated.   5. The pericardial effusion is circumferential.   6. Mild mitral valve regurgitation.   7. The aortic valve is normal in structure. Aortic valve regurgitation is  not visualized.   8. The inferior vena cava is normal in size with greater than 50%  respiratory variability, suggesting right atrial pressure of 3 mmHg.   EKG:  EKG is *** ordered today.  The ekg ordered today demonstrates ***  Recent Labs: 08/15/2021: BUN 9; Creatinine, Ser 0.61; Hemoglobin 14.2; Platelets 154; Potassium 4.0; Sodium 142  Recent Lipid Panel    Component Value Date/Time   CHOL 107 09/16/2019 1334   TRIG 181 (H) 09/16/2019 1334   HDL 33 (L) 09/16/2019 1334   CHOLHDL 3.2 09/16/2019 1334   CHOLHDL 3.1 01/14/2016 0908   VLDL 14 01/14/2016 0908   LDLCALC 44 09/16/2019 1334   LDLDIRECT 149 (H) 02/27/2010 2015     Risk Assessment/Calculations:    CHA2DS2-VASc Score = 4  {Confirm score is correct.  If not, click here to update score.  REFRESH note.  :1} This indicates a 4.8% annual risk of stroke. The patient's score is based upon: CHF History: 1 HTN History: 1 Diabetes History: 0 Stroke History: 0 Vascular Disease History: 0 Age Score: 1 Gender Score: 1   {This patient has a significant risk of stroke if diagnosed with  atrial fibrillation.  Please consider VKA or DOAC agent for anticoagulation if the bleeding risk is acceptable.   You can also use the Smart[employer] .[place] for documentation.   :[id number]}   Physical Exam:    VS:  There were no vitals taken for this visit.    Wt Readings from Last 3 Encounters:  11/30/21 218 lb 9.6 oz (99.2 kg)  10/25/20 218 lb (98.9 kg)  10/01/19 200 lb 12.8 oz (91.1 kg)     GEN: *** Well nourished, well developed in no acute distress HEENT: Normal NECK: No JVD; No carotid bruits LYMPHATICS: No lymphadenopathy  CARDIAC: ***RRR, no murmurs, rubs, gallops RESPIRATORY:  Clear to auscultation without rales, wheezing or rhonchi  ABDOMEN: Soft, non-tender, non-distended MUSCULOSKELETAL:  No edema; No deformity  SKIN: Warm and dry NEUROLOGIC:  Alert and oriented x 3 PSYCHIATRIC:  Normal affect   ASSESSMENT AND PLAN:    NICM EF improved to 60-65% in 09/2018. EF was 55-60% in 10/2020.   2. PAF  3. HTN  Medication Adjustments/Labs and Tests Ordered: Current medicines are reviewed at length with the patient today.  Concerns regarding medicines are outlined above.  No orders of the defined types were placed in this encounter.  No orders of the defined types were placed in this encounter.   There are no Patient Instructions on file for this visit.   Jarrett Soho, [place]  12/03/2021 8:50 AM    [facility]

## 2021-12-17 ENCOUNTER — Encounter: Payer: Self-pay | Admitting: Physician Assistant

## 2021-12-17 ENCOUNTER — Ambulatory Visit: Payer: Medicare (Managed Care) | Attending: Physician Assistant | Admitting: Physician Assistant

## 2021-12-17 VITALS — BP 128/82 | HR 50 | Ht 59.0 in | Wt 218.2 lb

## 2021-12-17 DIAGNOSIS — I10 Essential (primary) hypertension: Secondary | ICD-10-CM

## 2021-12-17 DIAGNOSIS — I480 Paroxysmal atrial fibrillation: Secondary | ICD-10-CM

## 2021-12-17 DIAGNOSIS — Z9989 Dependence on other enabling machines and devices: Secondary | ICD-10-CM

## 2021-12-17 DIAGNOSIS — G4733 Obstructive sleep apnea (adult) (pediatric): Secondary | ICD-10-CM | POA: Diagnosis not present

## 2021-12-17 DIAGNOSIS — I420 Dilated cardiomyopathy: Secondary | ICD-10-CM

## 2021-12-17 DIAGNOSIS — Z6841 Body Mass Index (BMI) 40.0 and over, adult: Secondary | ICD-10-CM

## 2021-12-17 NOTE — Progress Notes (Signed)
Cardiology Office Note:    Date:  12/17/2021   ID:  Sharon Cain, DOB April 10, 1955, MRN [id number]  PCP:  Cipriano Mile, NP  [facility] Cardiologist:  Jenkins Rouge, MD  [place] Electrophysiologist:  None   Chief Complaint: 12 month follow up   History of Present Illness:    Sharon Cain is a 66 y.o. female with a hx of PAF, LBBB, dilated cardiomyopathy COPD, HTN, HLD and OSA on CPAP seen for follow up.   Normal cath in 07/2016 with EF 40-45%. EF improved to 60-65% in 09/2018.   Last echo 10/2020 with LVEF of 55-60% and grade II DD. Mild MR.   Last seem by Dr. Johnsie Cancel 10/2020.   Patient is here for follow-up.  She reports chronic dyspnea on exertion with extreme activity and incline walking.  This is chronic but worsened slightly lately.  She takes care of of demented mother and husband who is going to dialysis.  She denies orthopnea, PND, syncope, lower extremity edema or melena.  Compliant with medication.  Past Medical History:  Diagnosis Date   A-fib [facility]) 2017   Anxiety    Arthritis    Chest pain, central 11/15/2010   Patient admitted to hospital for cardiac rule-out. No MI.     Depression    DEPRESSION 04/02/2007   Qualifier: Diagnosis of  By: Henderson Baltimore MD, Jessica     Hyperlipidemia    Hypertension    Insomnia    Obesity    Tobacco abuse 03/30/2007   Qualifier: Diagnosis of  By: Buelah Manis MD, Lonell Grandchild      Past Surgical History:  Procedure Laterality Date   CARPAL TUNNEL RELEASE Bilateral    bilateral   CHOLECYSTECTOMY     removed before the age of 72   RIGHT/LEFT HEART CATH AND CORONARY ANGIOGRAPHY N/A 07/30/2016   Procedure: Right/Left Heart Cath and Coronary Angiography;  Surgeon: Belva Crome, MD;  Location: [place] [facility];  Service: Cardiovascular;  Laterality: N/A;    Current Medications: Current Meds  Medication Sig   atorvastatin (LIPITOR) 80 MG tablet Take 0.5 tablets (40 mg total) by mouth daily.   Black Cohosh 200 MG CAPS Take 1 capsule by  mouth daily.   busPIRone (BUSPAR) 10 MG tablet Take 10 mg by mouth 3 (three) times daily as needed.   calcium carbonate (OS-CAL) 600 MG TABS tablet Take 600 mg by mouth daily.   carvedilol (COREG) 6.25 MG tablet Take 1 tablet (6.25 mg total) by mouth 2 (two) times daily with a meal. Please keep 08/27/2019 appointment for further refills   ELIQUIS 5 MG TABS tablet TAKE 1 TABLET BY MOUTH  TWICE DAILY   escitalopram (LEXAPRO) 20 MG tablet Take 20 mg by mouth daily.   levothyroxine (SYNTHROID) 25 MCG tablet Take 25 mcg by mouth daily.   meclizine (ANTIVERT) 12.5 MG tablet Take 1 tablet by mouth as needed for dizziness.   Melatonin 10 MG TABS Take 20 mg by mouth at bedtime as needed.   montelukast (SINGULAIR) 10 MG tablet Take 10 mg by mouth at bedtime.   ondansetron (ZOFRAN ODT) 4 MG disintegrating tablet Take 1 tablet (4 mg total) by mouth every 8 (eight) hours as needed for nausea or vomiting.   oxybutynin (DITROPAN) 5 MG tablet Take 5 mg by mouth daily.   Tiotropium Bromide-Olodaterol (STIOLTO RESPIMAT) 2.5-2.5 MCG/ACT AERS Inhale 2 puffs into the lungs daily.   umeclidinium-vilanterol (ANORO ELLIPTA) 62.5-25 MCG/ACT AEPB Inhale 1 puff into the lungs  daily.     Allergies:   Sulfa antibiotics, Amlodipine, Lisinopril, and Ramipril   Social History   Socioeconomic History   Marital status: Married    Spouse name: Not on file   Number of children: Not on file   Years of education: Not on file   Highest education level: Not on file  Occupational History   Not on file  Tobacco Use   Smoking status: Former    Packs/day: 1.00    Years: 40.00    Total pack years: 40.00    Types: Cigarettes    Quit date: 12/09/2015    Years since quitting: 6.0    Passive exposure: Past   Smokeless tobacco: Never  Vaping Use   Vaping Use: Never used  Substance and Sexual Activity   Alcohol use: No    Alcohol/week: 0.0 standard drinks of alcohol   Drug use: No   Sexual activity: Not on file  Other Topics  Concern   Not on file  Social History Narrative   Patient does not work.  She recently stopped working to take care of her husband and mother.  She reports a great relief in anxiety since leaving her job.  She currently lives with her husband Sharon Cain, age 45) and her mother Sharon Cain, 70) and five dogs. She owns a car.  She does not have any religious beliefs that would affect her health care.  She does not have an advanced directive.  She reports that her husband Sharon Cain would make decisions for her if she was unable to do so.  She does not exercise regularly.  Patient had previous tobacco use but quit in 2017.  No recreational drug use.  No alcohol use.  Not at risk of having sexually transmitted disease and feels safe in her relationship.  For fun, patient notes and crochets.   Social Determinants of Health   Financial Resource Strain: Not on file  Food Insecurity: Not on file  Transportation Needs: Not on file  Physical Activity: Not on file  Stress: Not on file  Social Connections: Not on file     Family History: [name] includes Atrial fibrillation in her mother; COPD in her father and mother; Congestive Heart Failure in her father; Diabetes in her mother; Heart failure in her mother; Sudden Cardiac Death in her father.    ROS:   Please see the history of present illness.    All other systems reviewed and are negative.   EKGs/Labs/Other Studies Reviewed:    The following studies were reviewed today:  Echo 10/2020  1. Frequent PVCs during study.   2. Apical window is somewhat foreshortened Basal inferior hypokinesis  with mild aneurysmal dilitation. NO significant change from echo of  10/07/18.Marland Kitchen Left ventricular ejection fraction, by estimation, is 55 to 60%.  The left ventricle has normal function.  The left ventricular internal cavity size was moderately dilated. Left  ventricular diastolic parameters are consistent with Grade II diastolic   dysfunction (pseudonormalization). Elevated left atrial pressure.   3. Right ventricular systolic function is normal. The right ventricular  size is normal. There is normal pulmonary artery systolic pressure.   4. Left atrial size was mild to moderately dilated.   5. The pericardial effusion is circumferential.   6. Mild mitral valve regurgitation.   7. The aortic valve is normal in structure. Aortic valve regurgitation is  not visualized.   8. The inferior vena cava is normal in size with greater than  50%  respiratory variability, suggesting right atrial pressure of 3 mmHg.   Right/Left Heart Cath and Coronary Angiography 07/2016   Conclusion  Normal coronary arteries. Left dominant coronary anatomy. Normal left ventricular systolic function. Segmental mid anterior wall mild hypokinesis. Estimated ejection fraction 50-55%. Normal pulmonary artery pressures, including the pulmonary capillary wench.    EKG:  EKG is ordered today.  The ekg ordered today demonstrates sinus bradycardia, chronic T wave inversion in inferior lateral leads  Recent Labs: 08/15/2021: BUN 9; Creatinine, Ser 0.61; Hemoglobin 14.2; Platelets 154; Potassium 4.0; Sodium 142  Recent Lipid Panel    Component Value Date/Time   CHOL 107 09/16/2019 1334   TRIG 181 (H) 09/16/2019 1334   HDL 33 (L) 09/16/2019 1334   CHOLHDL 3.2 09/16/2019 1334   CHOLHDL 3.1 01/14/2016 0908   VLDL 14 01/14/2016 0908   LDLCALC 44 09/16/2019 1334   LDLDIRECT 149 (H) 02/27/2010 2015     Risk Assessment/Calculations:    CHA2DS2-VASc Score = 4   This indicates a 4.8% annual risk of stroke. The patient's score is based upon: CHF History: 1 HTN History: 1 Diabetes History: 0 Stroke History: 0 Vascular Disease History: 0 Age Score: 1 Gender Score: 1      Physical Exam:    VS:  BP 128/82   Pulse (!) 50   Ht '4\' 11"'$  (1.499 m)   Wt 218 lb 3.2 oz (99 kg)   SpO2 96%   BMI 44.07 kg/m     Wt Readings from Last 3 Encounters:   12/17/21 218 lb 3.2 oz (99 kg)  11/30/21 218 lb 9.6 oz (99.2 kg)  10/25/20 218 lb (98.9 kg)     GEN:  Well nourished, well developed in no acute distress HEENT: Normal NECK: No JVD; No carotid bruits LYMPHATICS: No lymphadenopathy CARDIAC: RRR, no murmurs, rubs, gallops RESPIRATORY:  Clear to auscultation without rales, wheezing or rhonchi  ABDOMEN: Soft, non-tender, non-distended MUSCULOSKELETAL:  No edema; No deformity  SKIN: Warm and dry NEUROLOGIC:  Alert and oriented x 3 PSYCHIATRIC:  Normal affect   ASSESSMENT AND PLAN:    Dilated cardiomyopathy Cardiac cath in 2018 with normal coronaries.  EF has recovered to normal.  2.  Dyspnea on exertion She has chronic dyspnea on exertion without chest pain or pressure.  This has worsened lately.  Prior work-up reassuring as above.  She has chronic T wave inversion on EKG.  Also being followed by pulmonologist.  Seems her symptoms are multifactorial from deconditioning, morbid obesity, COPD and diastolic dysfunction.  Mutual agreement to defer repeat cardiac evaluation until next office visit.  Discussed trial of diuretic but declined.  I have recommended weight loss, diet control and work on exercise.  3.  COPD -No active wheezing  4.  Hypertension -Blood pressure controlled on current medication.  Medication Adjustments/Labs and Tests Ordered: Current medicines are reviewed at length with the patient today.  Concerns regarding medicines are outlined above.  Orders Placed This Encounter  Procedures   EKG 12-Lead   No orders of the defined types were placed in this encounter.   Patient Instructions  Medication Instructions:  Your physician recommends that you continue on your current medications as directed. Please refer to the Current Medication list given to you today.  *If you need a refill on your cardiac medications before your next appointment, please call your pharmacy*   Lab Work: None ordered  If you have labs  (blood work) drawn today and your tests are completely normal,  you will receive your results only by: [place] (if you have MyChart) OR A paper copy in the mail If you have any lab test that is abnormal or we need to change your treatment, we will call you to review the results.   Testing/Procedures: None ordered   Follow-Up: At [facility], you and your health needs are our priority.  As part of our continuing mission to provide you with exceptional heart care, we have created designated Provider Care Teams.  These Care Teams include your primary Cardiologist (physician) and Advanced Practice Providers (APPs -  Physician Assistants and Nurse Practitioners) who all work together to provide you with the care you need, when you need it.  We recommend signing up for the patient portal called "MyChart".  Sign up information is provided on this After Visit Summary.  MyChart is used to connect with patients for Virtual Visits (Telemedicine).  Patients are able to view lab/test results, encounter notes, upcoming appointments, etc.  Non-urgent messages can be sent to your provider as well.   To learn more about what you can do with MyChart, go to [email or web address].    Your next appointment:   6 month(s)  The format for your next appointment:   In Person  Provider:   Jenkins Rouge, MD     Other Instructions   Important Information About [place]         Signed, Leanor Kail, [place]  12/17/2021 8:20 AM    [place]

## 2021-12-17 NOTE — Patient Instructions (Signed)
Medication Instructions:  Your physician recommends that you continue on your current medications as directed. Please refer to the Current Medication list given to you today.  *If you need a refill on your cardiac medications before your next appointment, please call your pharmacy*   Lab Work: None ordered  If you have labs (blood work) drawn today and your tests are completely normal, you will receive your results only by: [place] (if you have MyChart) OR A paper copy in the mail If you have any lab test that is abnormal or we need to change your treatment, we will call you to review the results.   Testing/Procedures: None ordered   Follow-Up: At [facility], you and your health needs are our priority.  As part of our continuing mission to provide you with exceptional heart care, we have created designated Provider Care Teams.  These Care Teams include your primary Cardiologist (physician) and Advanced Practice Providers (APPs -  Physician Assistants and Nurse Practitioners) who all work together to provide you with the care you need, when you need it.  We recommend signing up for the patient portal called "MyChart".  Sign up information is provided on this After Visit Summary.  MyChart is used to connect with patients for Virtual Visits (Telemedicine).  Patients are able to view lab/test results, encounter notes, upcoming appointments, etc.  Non-urgent messages can be sent to your provider as well.   To learn more about what you can do with MyChart, go to [email or web address].    Your next appointment:   6 month(s)  The format for your next appointment:   In Person  Provider:   Jenkins Rouge, MD     Other Instructions   Important Information About [place]

## 2022-01-15 ENCOUNTER — Telehealth: Payer: Self-pay | Admitting: Student

## 2022-01-15 NOTE — Telephone Encounter (Signed)
Called and left voicemail for patient to call office back to go over inhaler she needed sent in. Pended the order until she calls back.

## 2022-01-28 ENCOUNTER — Other Ambulatory Visit: Payer: Self-pay | Admitting: *Deleted

## 2022-01-28 MED ORDER — STIOLTO RESPIMAT 2.5-2.5 MCG/ACT IN AERS: 2.0000 | INHALATION_SPRAY | Freq: Every day | RESPIRATORY_TRACT | 5 refills | Status: DC

## 2022-01-30 ENCOUNTER — Telehealth: Payer: Self-pay

## 2022-01-30 NOTE — Telephone Encounter (Signed)
PA request received via CMM from [place] for Stiolto Respimat 2.5-2.5MCG/ACT aerosol.  PA has been completed and submitted, awaiting determination.  Key: [id number]

## 2022-01-30 NOTE — Telephone Encounter (Signed)
PA has been APPROVED from 01/30/2022-01/30/2023.

## 2022-02-04 NOTE — Progress Notes (Unsigned)
Office Visit    Patient Name: Sharon Cain Date of Encounter: 02/04/2022  Primary Care Provider:  Cipriano Mile, NP Primary Cardiologist:  Jenkins Rouge, MD Primary Electrophysiologist: None  Chief Complaint    Sharon Cain is a 66 y.o. female with PMH of PAF, LBBB, dilated cardiomyopathy COPD, HTN, HLD and OSA on CPAP who presents today for complaint of increased edema and elevated heart rate.  Past Medical History    Past Medical History:  Diagnosis Date   A-fib [facility]) 2017   Anxiety    Arthritis    Chest pain, central 11/15/2010   Patient admitted to hospital for cardiac rule-out. No MI.     Depression    DEPRESSION 04/02/2007   Qualifier: Diagnosis of  By: Henderson Baltimore MD, Jessica     Hyperlipidemia    Hypertension    Insomnia    Obesity    Tobacco abuse 03/30/2007   Qualifier: Diagnosis of  By: Buelah Manis MD, Lonell Grandchild     Past Surgical History:  Procedure Laterality Date   CARPAL TUNNEL RELEASE Bilateral    bilateral   CHOLECYSTECTOMY     removed before the age of 30   RIGHT/LEFT HEART CATH AND CORONARY ANGIOGRAPHY N/A 07/30/2016   Procedure: Right/Left Heart Cath and Coronary Angiography;  Surgeon: Belva Crome, MD;  Location: [place] [facility];  Service: Cardiovascular;  Laterality: N/A;    Allergies  Allergies  Allergen Reactions   Sulfa Antibiotics     Pancreatitis   Amlodipine Swelling   Lisinopril     Other reaction(s): Other   Ramipril     Other reaction(s): Other    History of Present Illness    Sharon Cain  is a 66 year old female with the above mention past medical history who presents today for complaint of elevated heart rate and increased edema.  She presented to the ED in 2017 with complaint of chest pain and irregular heartbeat.  She was found to have new onset paroxysmal AF and underwent stress test that was negative for ischemia.  2D echo was completed showing EF of 35% and diffuse hypokinesis she was seen in follow-up by Dr.Nishan was  sent for [facility] due to decreased LV function that revealed normal coronaries and right heart pressures.  She wore an event monitor for palpitations sinus with isolated PVCs and no significant arrhythmia.  PAF was managed with amiodarone and Eliquis for anticoagulation.  She was transitioned to beta-blocker from amiodarone and was seen and follow-up with no new cardiac complaints.  Repeat 2D echo 09/2018 with improved EF of 60-65% and LBBB stable with no high-grade block.  She was last seen by Dr. Johnsie Cancel 10/2020 for follow-up.  2D echo was repeated and revealed EF of 55-60% and grade 2 DD with mild MR.  She was seen on 12/17/2021 by Robbie Lis, PA for follow-up and was found to have dyspnea on exertion with no chest pain complaints.  She was offered trial of diuretics but declined and decision was made to defer repeat cardiac evaluation until follow-up visit.   Since last being seen in the office patient reports***.  Patient denies chest pain, palpitations, dyspnea, PND, orthopnea, nausea, vomiting, dizziness, syncope, edema, weight gain, or early satiety.     ***Notes: -Patient will need follow-up cardiac evaluation Home Medications    Current Outpatient Medications  Medication Sig Dispense Refill   atorvastatin (LIPITOR) 80 MG tablet Take 0.5 tablets (40 mg total) by mouth daily. 90 tablet  3   Black Cohosh 200 MG CAPS Take 1 capsule by mouth daily.     busPIRone (BUSPAR) 10 MG tablet Take 10 mg by mouth 3 (three) times daily as needed.     calcium carbonate (OS-CAL) 600 MG TABS tablet Take 600 mg by mouth daily.     carvedilol (COREG) 6.25 MG tablet Take 1 tablet (6.25 mg total) by mouth 2 (two) times daily with a meal. Please keep 08/27/2019 appointment for further refills 180 tablet 2   ELIQUIS 5 MG TABS tablet TAKE 1 TABLET BY MOUTH  TWICE DAILY 180 tablet 1   escitalopram (LEXAPRO) 20 MG tablet Take 20 mg by mouth daily.     levothyroxine (SYNTHROID) 25 MCG tablet Take 25 mcg by mouth daily.      meclizine (ANTIVERT) 12.5 MG tablet Take 1 tablet by mouth as needed for dizziness.     Melatonin 10 MG TABS Take 20 mg by mouth at bedtime as needed.     montelukast (SINGULAIR) 10 MG tablet Take 10 mg by mouth at bedtime.     ondansetron (ZOFRAN ODT) 4 MG disintegrating tablet Take 1 tablet (4 mg total) by mouth every 8 (eight) hours as needed for nausea or vomiting. 20 tablet 0   oxybutynin (DITROPAN) 5 MG tablet Take 5 mg by mouth daily.     Tiotropium Bromide-Olodaterol (STIOLTO RESPIMAT) 2.5-2.5 MCG/ACT AERS Inhale 2 puffs into the lungs daily. 4 g 5   umeclidinium-vilanterol (ANORO ELLIPTA) 62.5-25 MCG/ACT AEPB Inhale 1 puff into the lungs daily. 60 each 11   No current facility-administered medications for this visit.     Review of Systems  Please see the history of present illness.    (+)*** (+)***  All other systems reviewed and are otherwise negative except as noted above.  Physical Exam    Wt Readings from Last 3 Encounters:  12/17/21 218 lb 3.2 oz (99 kg)  11/30/21 218 lb 9.6 oz (99.2 kg)  10/25/20 218 lb (98.9 kg)   WJ:XBJYN were no vitals filed for this visit.,There is no height or weight on file to calculate BMI.  Constitutional:      Appearance: Healthy appearance. Not in distress.  Neck:     Vascular: JVD normal.  Pulmonary:     Effort: Pulmonary effort is normal.     Breath sounds: No wheezing. No rales. Diminished in the bases Cardiovascular:     Normal rate. Regular rhythm. Normal S1. Normal S2.      Murmurs: There is no murmur.  Edema:    Peripheral edema absent.  Abdominal:     Palpations: Abdomen is soft non tender. There is no hepatomegaly.  Skin:    General: Skin is warm and dry.  Neurological:     General: No focal deficit present.     Mental Status: Alert and oriented to person, place and time.     Cranial Nerves: Cranial nerves are intact.  EKG/LABS/Other Studies Reviewed    ECG personally reviewed by me today - ***  Risk  Assessment/Calculations:   {Does this patient have ATRIAL FIBRILLATION?[phone]}        Lab Results  Component Value Date   WBC 4.3 08/15/2021   HGB 14.2 08/15/2021   HCT 41.7 08/15/2021   MCV 92 08/15/2021   PLT 154 08/15/2021   Lab Results  Component Value Date   CREATININE 0.61 08/15/2021   BUN 9 08/15/2021   NA 142 08/15/2021   K 4.0 08/15/2021   CL 106  08/15/2021   CO2 24 08/15/2021   Lab Results  Component Value Date   ALT 66 (H) 09/29/2019   AST 27 09/29/2019   ALKPHOS 84 09/29/2019   BILITOT 1.8 (H) 09/29/2019   Lab Results  Component Value Date   CHOL 107 09/16/2019   HDL 33 (L) 09/16/2019   LDLCALC 44 09/16/2019   LDLDIRECT 149 (H) 02/27/2010   TRIG 181 (H) 09/16/2019   CHOLHDL 3.2 09/16/2019    Lab Results  Component Value Date   HGBA1C 5.8 (A) 09/16/2019    Assessment & Plan    1.HFimEF: -EF in 2017 was 35% and has improved to 55-60% based on recent echo completed 10/2020 -She has chronic dyspnea exertion and reports*** -  2.  History of PAF: -Patient is currently rate controlled with carvedilol 6.25 mg and reports no bleeding with Eliquis 5 mg twice daily  3.  Essential hypertension: -Patient's blood pressure today was***  4.  History of COPD:  5.      Disposition: Follow-up with Jenkins Rouge, MD or APP in *** months {Are you ordering a CV Procedure (e.g. stress test, cath, DCCV, TEE, etc)?   Press F2        :[phone]}   Medication Adjustments/Labs and Tests Ordered: Current medicines are reviewed at length with the patient today.  Concerns regarding medicines are outlined above.   Signed, Mable Fill, Marissa Nestle, NP 02/04/2022, 7:30 AM [facility]  Note:  This document was prepared using Dragon voice recognition software and may include unintentional dictation errors.

## 2022-02-05 ENCOUNTER — Ambulatory Visit: Payer: Medicare (Managed Care) | Attending: Nurse Practitioner | Admitting: Nurse Practitioner

## 2022-02-05 ENCOUNTER — Encounter: Payer: Self-pay | Admitting: Nurse Practitioner

## 2022-02-05 ENCOUNTER — Ambulatory Visit (INDEPENDENT_AMBULATORY_CARE_PROVIDER_SITE_OTHER): Payer: Medicare (Managed Care)

## 2022-02-05 VITALS — BP 140/80 | HR 56 | Ht 59.0 in | Wt 213.8 lb

## 2022-02-05 DIAGNOSIS — R001 Bradycardia, unspecified: Secondary | ICD-10-CM | POA: Diagnosis not present

## 2022-02-05 DIAGNOSIS — I2089 Other forms of angina pectoris: Secondary | ICD-10-CM

## 2022-02-05 DIAGNOSIS — I5022 Chronic systolic (congestive) heart failure: Secondary | ICD-10-CM

## 2022-02-05 DIAGNOSIS — I4891 Unspecified atrial fibrillation: Secondary | ICD-10-CM | POA: Diagnosis not present

## 2022-02-05 DIAGNOSIS — J439 Emphysema, unspecified: Secondary | ICD-10-CM

## 2022-02-05 MED ORDER — POTASSIUM CHLORIDE ER 10 MEQ PO TBCR: 10.0000 meq | EXTENDED_RELEASE_TABLET | Freq: Every day | ORAL | 0 refills | Status: DC

## 2022-02-05 NOTE — Patient Instructions (Addendum)
Medication Instructions:  INCREASE Lasix to '20mg'$  1 tablet twice a day for 3 days then take '20mg'$  daily  *If you need a refill on your cardiac medications before your next appointment, please call your pharmacy*   Lab Work: TODAY-BMET & BNP 1 WEEK BMET & MAG If you have labs (blood work) drawn today and your tests are completely normal, you will receive your results only by: [place] (if you have MyChart) OR A paper copy in the mail If you have any lab test that is abnormal or we need to change your treatment, we will call you to review the results.   Testing/Procedures: Your physician has requested that you have a lexiscan myoview. For further information please visit [email or web address]. Please follow instruction sheet, as given.  ZIO XT- Long Term Monitor Instructions  Your physician has requested you wear a ZIO patch monitor for 14 days.  This is a single patch monitor. [employer] supplies one patch monitor per enrollment. Additional stickers are not available. Please do not apply patch if you will be having a Nuclear Stress Test,  Echocardiogram, Cardiac CT, MRI, or Chest Xray during the period you would be wearing the  monitor. The patch cannot be worn during these tests. You cannot remove and re-apply the  ZIO XT patch monitor.  Your ZIO patch monitor will be mailed 3 day USPS to your address on file. It may take 3-5 days  to receive your monitor after you have been enrolled.  Once you have received your monitor, please review the enclosed instructions. Your monitor  has already been registered assigning a specific monitor serial # to you.  Billing and Patient Assistance Program Information  We have supplied [employer] with any of your insurance information on file for billing purposes. [employer] offers a sliding scale Patient Assistance Program for patients that do not have  insurance, or whose insurance does not completely cover the cost of the ZIO monitor.  You must apply  for the Patient Assistance Program to qualify for this discounted rate.  To apply, please call [employer] at [phone], select option 4, select option 2, ask to apply for  Patient Assistance Program. Theodore Demark will ask your household income, and how many people  are in your household. They will quote your out-of-pocket cost based on that information.  [employer] will also be able to set up a 38-month interest-free payment plan if needed.  Applying the monitor   Shave hair from upper left chest.  Hold abrader disc by orange tab. Rub abrader in 40 strokes over the upper left chest as  indicated in your monitor instructions.  Clean area with 4 enclosed alcohol pads. Let dry.  Apply patch as indicated in monitor instructions. Patch will be placed under collarbone on left  side of chest with arrow pointing upward.  Rub patch adhesive wings for 2 minutes. Remove white label marked "1". Remove the white  label marked "2". Rub patch adhesive wings for 2 additional minutes.  While looking in a mirror, press and release button in center of patch. A small green light will  flash 3-4 times. This will be your only indicator that the monitor has been turned on.  Do not shower for the first 24 hours. You may shower after the first 24 hours.  Press the button if you feel a symptom. You will hear a small click. Record Date, Time and  Symptom in the Patient Logbook.  When you are ready to remove the patch, follow instructions on  the last 2 pages of Patient  Logbook. Stick patch monitor onto the last page of Patient Logbook.  Place Patient Logbook in the blue and white box. Use locking tab on box and tape box closed  securely. The blue and white box has prepaid postage on it. Please place it in the mailbox as  soon as possible. Your physician should have your test results approximately 7 days after the  monitor has been mailed back to [facility].  Call [place] at [phone] if you  have questions regarding  your ZIO XT patch monitor. Call them immediately if you see an orange light blinking on your  monitor.  If your monitor falls off in less than 4 days, contact our Monitor department at [phone].  If your monitor becomes loose or falls off after 4 days call [employer] at [phone] for  suggestions on securing your monitor   Follow-Up: At [facility], you and your health needs are our priority.  As part of our continuing mission to provide you with exceptional heart care, we have created designated Provider Care Teams.  These Care Teams include your primary Cardiologist (physician) and Advanced Practice Providers (APPs -  Physician Assistants and Nurse Practitioners) who all work together to provide you with the care you need, when you need it.  We recommend signing up for the patient portal called "MyChart".  Sign up information is provided on this After Visit Summary.  MyChart is used to connect with patients for Virtual Visits (Telemedicine).  Patients are able to view lab/test results, encounter notes, upcoming appointments, etc.  Non-urgent messages can be sent to your provider as well.   To learn more about what you can do with MyChart, go to [email or web address].    Your next appointment:   1 month(s)  The format for your next appointment:   In Person  Provider:   Ambrose Pancoast, NP       Other Instructions CHECK YOUR WEIGHT DAILY LOW SODIUM DIET CHECK YOUR BLOOD PRESSURE DAILY FOR ONE WEEK THEN CONTACT THE OFFICE WITH YOUR READINGS (ITS BEST TO CHECK YOUR BLOOD PRESSURE 2-3 HOURS AFTER TAKING YOUR BLOOD PRESSURE MEDICATION.)  Important Information About Sugar

## 2022-02-05 NOTE — Progress Notes (Unsigned)
Enrolled for [employer] to mail a ZIO XT long term holter monitor to the patients address on file.   Dr. Nishan to read. 

## 2022-02-06 ENCOUNTER — Telehealth (HOSPITAL_COMMUNITY): Payer: Self-pay | Admitting: *Deleted

## 2022-02-06 ENCOUNTER — Encounter (HOSPITAL_COMMUNITY): Payer: Self-pay | Admitting: *Deleted

## 2022-02-06 NOTE — Telephone Encounter (Signed)
Called patient with instructions for upcoming stress test. She requested instructions to be sent via my chart.  Instructions sent.  Sharon Cain

## 2022-02-07 ENCOUNTER — Telehealth: Payer: Self-pay | Admitting: Student

## 2022-02-07 ENCOUNTER — Telehealth: Payer: Self-pay | Admitting: Cardiovascular Disease

## 2022-02-07 DIAGNOSIS — R001 Bradycardia, unspecified: Secondary | ICD-10-CM | POA: Diagnosis not present

## 2022-02-07 DIAGNOSIS — I4891 Unspecified atrial fibrillation: Secondary | ICD-10-CM | POA: Diagnosis not present

## 2022-02-07 DIAGNOSIS — I2089 Other forms of angina pectoris: Secondary | ICD-10-CM

## 2022-02-07 MED ORDER — FUROSEMIDE 20 MG PO TABS: 20.0000 mg | ORAL_TABLET | Freq: Every day | ORAL | 3 refills | Status: DC

## 2022-02-07 MED ORDER — ANORO ELLIPTA 62.5-25 MCG/ACT IN AEPB: 1.0000 | INHALATION_SPRAY | Freq: Every day | RESPIRATORY_TRACT | 11 refills | Status: DC

## 2022-02-07 NOTE — Telephone Encounter (Signed)
Pt c/o medication issue:  1. Name of Medication:   furosemide (LASIX) 20 MG tablet   2. How are you currently taking this medication (dosage and times per day)? Patient stated she has not been taking since August  3. Are you having a reaction (difficulty breathing--STAT)? N/A  4. What is your medication issue?   Patient stated she was supposed to have been put back on this medication but no prescription was called in.

## 2022-02-07 NOTE — Telephone Encounter (Signed)
Called patient and she states that she was needing refills on her inhaler. Verified that she states the Stiolto is too expensive and her insurance is not covering it. Pt states she wanted refills on her Anoro inhaler. Verified pharmacy. Nothing further needed

## 2022-02-07 NOTE — Telephone Encounter (Signed)
Sent in Lasix as prescribed by Ambrose Pancoast NP on 02/05/22. Called patient to make sure we sent it to the right pharmacy. Patient stated to send to [facility]. Order has been placed.

## 2022-02-07 NOTE — Telephone Encounter (Signed)
Patient called to request a refill for her mono inhaler.  She stated that she uses the [place] at [facility].  Please advise.

## 2022-02-12 NOTE — Addendum Note (Signed)
Addended by: Tempie Donning on: 02/12/2022 08:51 AM   Modules accepted: Orders

## 2022-02-13 ENCOUNTER — Ambulatory Visit: Payer: Medicare (Managed Care) | Attending: Nurse Practitioner

## 2022-02-13 ENCOUNTER — Ambulatory Visit (HOSPITAL_BASED_OUTPATIENT_CLINIC_OR_DEPARTMENT_OTHER): Payer: Medicare (Managed Care)

## 2022-02-13 DIAGNOSIS — I2089 Other forms of angina pectoris: Secondary | ICD-10-CM | POA: Insufficient documentation

## 2022-02-13 DIAGNOSIS — R001 Bradycardia, unspecified: Secondary | ICD-10-CM | POA: Diagnosis present

## 2022-02-13 DIAGNOSIS — I4891 Unspecified atrial fibrillation: Secondary | ICD-10-CM | POA: Diagnosis present

## 2022-02-13 LAB — MYOCARDIAL PERFUSION IMAGING
LV dias vol: 111 mL (ref 46–106)
LV sys vol: 65 mL
Nuc Stress EF: 42 %
Peak HR: 67 {beats}/min
Rest HR: 59 {beats}/min
Rest Nuclear Isotope Dose: 10.2 mCi
SDS: 0
SRS: 2
SSS: 2
ST Depression (mm): 0 mm
Stress Nuclear Isotope Dose: 30.8 mCi
TID: 1.15

## 2022-02-13 MED ORDER — REGADENOSON 0.4 MG/5ML IV SOLN
0.4000 mg | Freq: Once | INTRAVENOUS | Status: AC
  Administered 2022-02-13: 0.4 mg via INTRAVENOUS

## 2022-02-13 MED ORDER — TECHNETIUM TC 99M TETROFOSMIN IV KIT
30.8000 | PACK | Freq: Once | INTRAVENOUS | Status: AC | PRN
  Administered 2022-02-13: 30.8 via INTRAVENOUS

## 2022-02-13 MED ORDER — TECHNETIUM TC 99M TETROFOSMIN IV KIT
10.2000 | PACK | Freq: Once | INTRAVENOUS | Status: AC | PRN
  Administered 2022-02-13: 10.2 via INTRAVENOUS

## 2022-02-14 LAB — BASIC METABOLIC PANEL
BUN/Creatinine Ratio: 18 (ref 12–28)
BUN: 12 mg/dL (ref 8–27)
CO2: 23 mmol/L (ref 20–29)
Calcium: 9.5 mg/dL (ref 8.7–10.3)
Chloride: 106 mmol/L (ref 96–106)
Creatinine, Ser: 0.67 mg/dL (ref 0.57–1.00)
Glucose: 336 mg/dL — ABNORMAL HIGH (ref 70–99)
Potassium: 4.3 mmol/L (ref 3.5–5.2)
Sodium: 143 mmol/L (ref 134–144)
eGFR: 96 mL/min/{1.73_m2} (ref >59)

## 2022-02-14 LAB — MAGNESIUM: Magnesium: 2 mg/dL (ref 1.6–2.3)

## 2022-03-03 NOTE — Progress Notes (Unsigned)
Synopsis: Referred for COPD by Cipriano Mile, NP  Subjective:   PATIENT ID: Sharon Cain GENDER: female DOB: 05-Nov-1955, MRN: [id number]  No chief complaint on file.  75yF with history of AF, anxiety, MDD, HTN, smoking referred for COPD  She says that she's had worsening DOE with even light activity over last couple of years. She has minor cough, occasionally productive. Does have sinus congestion and postnasal drainage. Has never had PFTs. Has been off of breo for several months, didn't find it helpful. She can't remember if a recent course of prednisone this year was helpful for her dypsnea. She doesn't take singulair. She uses flonase on and off. She has gained about 20 lb this year. No orthopnea. No LE swelling.   She has never had allergy testing.   She has OSA and uses CPAP.  Her mother has COPD, Father with emphysema  She worked in [employer], Scientist, research (medical). No concerning dust/solvent/particulate exposures. Does have 5 dogs at home. Smoked off and on for 30-40 years. 1ppd. Quit 7ya.   Interval HPI Last visit started on flonase, stiolto but too expensive, switched to anoro.   PFTs  Otherwise pertinent review of systems is negative.    Past Medical History:  Diagnosis Date   A-fib [facility]) 2017   Anxiety    Arthritis    Chest pain, central 11/15/2010   Patient admitted to hospital for cardiac rule-out. No MI.     Depression    DEPRESSION 04/02/2007   Qualifier: Diagnosis of  By: Henderson Baltimore MD, Jessica     Hyperlipidemia    Hypertension    Insomnia    Obesity    Tobacco abuse 03/30/2007   Qualifier: Diagnosis of  By: Buelah Manis MD, Lonell Grandchild       Family History  Problem Relation Age of Onset   Atrial fibrillation Mother    Diabetes Mother    Heart failure Mother    COPD Mother    COPD Father    Congestive Heart Failure Father    Sudden Cardiac Death Father      Past Surgical History:  Procedure Laterality Date   CARPAL TUNNEL RELEASE Bilateral    bilateral    CHOLECYSTECTOMY     removed before the age of 79   RIGHT/LEFT HEART CATH AND CORONARY ANGIOGRAPHY N/A 07/30/2016   Procedure: Right/Left Heart Cath and Coronary Angiography;  Surgeon: Belva Crome, MD;  Location: [place] [facility];  Service: Cardiovascular;  Laterality: N/A;    Social History   Socioeconomic History   Marital status: Married    Spouse name: Not on file   Number of children: Not on file   Years of education: Not on file   Highest education level: Not on file  Occupational History   Not on file  Tobacco Use   Smoking status: Former    Packs/day: 1.00    Years: 40.00    Total pack years: 40.00    Types: Cigarettes    Quit date: 12/09/2015    Years since quitting: 6.2    Passive exposure: Past   Smokeless tobacco: Never  Vaping Use   Vaping Use: Never used  Substance and Sexual Activity   Alcohol use: No    Alcohol/week: 0.0 standard drinks of alcohol   Drug use: No   Sexual activity: Not on file  Other Topics Concern   Not on file  Social History Narrative   Patient does not work.  She recently stopped working to take  care of her husband and mother.  She reports a great relief in anxiety since leaving her job.  She currently lives with her husband Sharon Cain, age 106) and her mother Sharon Cain, 58) and five dogs. She owns a car.  She does not have any religious beliefs that would affect her health care.  She does not have an advanced directive.  She reports that her husband Sharon Cain would make decisions for her if she was unable to do so.  She does not exercise regularly.  Patient had previous tobacco use but quit in 2017.  No recreational drug use.  No alcohol use.  Not at risk of having sexually transmitted disease and feels safe in her relationship.  For fun, patient notes and crochets.   Social Determinants of Health   Financial Resource Strain: Not on file  Food Insecurity: Not on file  Transportation Needs: Not on file  Physical Activity: Not on file   Stress: Not on file  Social Connections: Not on file  Intimate Partner Violence: Not on file     Allergies  Allergen Reactions   Sulfa Antibiotics     Pancreatitis   Amlodipine Swelling   Lisinopril     Other reaction(s): Other   Ramipril     Other reaction(s): Other     Outpatient Medications Prior to Visit  Medication Sig Dispense Refill   atorvastatin (LIPITOR) 80 MG tablet Take 0.5 tablets (40 mg total) by mouth daily. 90 tablet 3   Black Cohosh 200 MG CAPS Take 1 capsule by mouth daily.     busPIRone (BUSPAR) 10 MG tablet Take 10 mg by mouth 3 (three) times daily as needed.     calcium carbonate (OS-CAL) 600 MG TABS tablet Take 600 mg by mouth daily.     carvedilol (COREG) 6.25 MG tablet Take 1 tablet (6.25 mg total) by mouth 2 (two) times daily with a meal. Please keep 08/27/2019 appointment for further refills 180 tablet 2   ELIQUIS 5 MG TABS tablet TAKE 1 TABLET BY MOUTH  TWICE DAILY 180 tablet 1   escitalopram (LEXAPRO) 20 MG tablet Take 20 mg by mouth daily.     furosemide (LASIX) 20 MG tablet Take 1 tablet (20 mg total) by mouth daily. Take one tablet by mouth two times daily for 3 days then take one tablet by mouth daily. 93 tablet 3   levothyroxine (SYNTHROID) 25 MCG tablet Take 25 mcg by mouth daily.     meclizine (ANTIVERT) 12.5 MG tablet Take 1 tablet by mouth as needed for dizziness.     Melatonin 10 MG TABS Take 20 mg by mouth at bedtime as needed.     montelukast (SINGULAIR) 10 MG tablet Take 10 mg by mouth at bedtime.     ondansetron (ZOFRAN ODT) 4 MG disintegrating tablet Take 1 tablet (4 mg total) by mouth every 8 (eight) hours as needed for nausea or vomiting. 20 tablet 0   oxybutynin (DITROPAN) 5 MG tablet Take 5 mg by mouth daily.     potassium chloride (KLOR-CON) 10 MEQ tablet Take 1 tablet (10 mEq total) by mouth daily. 90 tablet 0   umeclidinium-vilanterol (ANORO ELLIPTA) 62.5-25 MCG/ACT AEPB Inhale 1 puff into the lungs daily. 60 each 11   No  facility-administered medications prior to visit.       Objective:   Physical Exam:  General appearance: 66 y.o., female, NAD, conversant  Eyes: anicteric sclerae; PERRL, tracking appropriately HENT: NCAT; MMM Neck: Trachea midline; no  lymphadenopathy, no JVD Lungs: CTAB, no crackles, no wheeze, with normal respiratory effort CV: RRR, no murmur  Abdomen: Soft, non-tender; non-distended, BS present  Extremities: No peripheral edema, warm Skin: Normal turgor and texture; no rash Psych: Appropriate affect Neuro: Alert and oriented to person and place, no focal deficit     There were no vitals filed for this visit.    on RA BMI Readings from Last 3 Encounters:  02/13/22 43.02 kg/m  02/05/22 43.18 kg/m  12/17/21 44.07 kg/m   Wt Readings from Last 3 Encounters:  02/13/22 213 lb (96.6 kg)  02/05/22 213 lb 12.8 oz (97 kg)  12/17/21 218 lb 3.2 oz (99 kg)     CBC    Component Value Date/Time   WBC 4.3 08/15/2021 1145   WBC 7.0 07/27/2016 0239   RBC 4.53 08/15/2021 1145   RBC 4.47 07/27/2016 0239   HGB 14.2 08/15/2021 1145   HCT 41.7 08/15/2021 1145   PLT 154 08/15/2021 1145   MCV 92 08/15/2021 1145   MCH 31.3 08/15/2021 1145   MCH 30.6 07/27/2016 0239   MCHC 34.1 08/15/2021 1145   MCHC 32.9 07/27/2016 0239   RDW 12.7 08/15/2021 1145   LYMPHSABS 2.1 07/26/2016 1620   MONOABS 0.6 05/24/2010 1804   EOSABS 0.2 07/26/2016 1620   BASOSABS 0.0 07/26/2016 1620     Chest Imaging: CT Chest 2019 reviewed by me with centrilobular and paraseptal emphysema, stable nodules  Pulmonary Functions Testing Results:    Latest Ref Rng & Units 06/17/2017   10:57 AM  PFT Results  FVC-Pre L 2.21   FVC-Predicted Pre % 80   FVC-Post L 2.14   FVC-Predicted Post % 77   Pre FEV1/FVC % % 83   Post FEV1/FCV % % 84   FEV1-Pre L 1.83   FEV1-Predicted Pre % 86   FEV1-Post L 1.81   DLCO uncorrected ml/min/mmHg 16.50   DLCO UNC% % 87   DLVA Predicted % 108   TLC L 3.72   TLC %  Predicted % 83   RV % Predicted % 73    PFT 2019 reviewed by me with borderline obstruction by FEV1/SVC, mild, otherwise normal   Echocardiogram:   TTE 10/2020:  1. Frequent PVCs during study.   2. Apical window is somewhat foreshortened Basal inferior hypokinesis  with mild aneurysmal dilitation. NO significant change from echo of  10/07/18.Marland Kitchen Left ventricular ejection fraction, by estimation, is 55 to 60%.  The left ventricle has normal function.  The left ventricular internal cavity size was moderately dilated. Left  ventricular diastolic parameters are consistent with Grade II diastolic  dysfunction (pseudonormalization). Elevated left atrial pressure.   3. Right ventricular systolic function is normal. The right ventricular  size is normal. There is normal pulmonary artery systolic pressure.   4. Left atrial size was mild to moderately dilated.   5. The pericardial effusion is circumferential.   6. Mild mitral valve regurgitation.   7. The aortic valve is normal in structure. Aortic valve regurgitation is  not visualized.   8. The inferior vena cava is normal in size with greater than 50%  respiratory variability, suggesting right atrial pressure of 3 mmHg.  Nuke stress 02/13/22:   Findings are consistent with normal perfusion. The study is intermediate risk due to mild LV systolic dysfunction.   Occasional PVCs noted throughout study.   There is a small defect with mild reduction in uptake present in the apex location(s). There is normal wall motion in  the defect area. Consistent with artifact caused by diaphragmatic attenuation and apical thinning.   Left ventricular function is abnormal. Global function is mildly reduced. Nuclear stress EF: 42 %. The left ventricular ejection fraction is moderately decreased (30-44%). End diastolic cavity size is normal. End systolic cavity size is normal.   Prior study available for comparison from 12/11/2015. Compared to prior nuclear stress  test, the EF has improved on current study (was previously 35%).    Assessment & Plan:   # DOE # Emphysema # Suspected COPD gold B Not sure if her increased DOE is due to worsening lung function, deconditioning, diastolic dysfunction/CHF (she does have G2DD, stress EF of 42% and weight is up this year though she doesn't look extravascularly volume overloaded).  # OSA on CPAP  # Chronic rhinosinusitis  # History of smoking Does LDCT screening, last was last week at [facility]: - Please request copy of disc with CT chest on it from [facility] to bring to next clinic visit - Start stiolto 2 puffs daily - ideally stop this for a few days before your PFT and would instead lean on albuterol inhaler for those few days. Just can't use albuterol on day of PFTs.  - Take shower, clear nose of crusting and then flonase 1 spray each nostril - See you in a few months or sooner if need be!     Maryjane Hurter, MD [facility] 03/03/2022 4:52 PM

## 2022-03-04 ENCOUNTER — Telehealth: Payer: Self-pay | Admitting: Cardiovascular Disease

## 2022-03-04 MED ORDER — CARVEDILOL 12.5 MG PO TABS: 12.5000 mg | ORAL_TABLET | Freq: Two times a day (BID) | ORAL | 3 refills | Status: DC

## 2022-03-04 NOTE — Telephone Encounter (Signed)
The patient has been notified of the result and verbalized understanding.  All questions (if any) were answered. Precious Gilding, RN 03/04/2022 1:42 PM   Updated medication list.

## 2022-03-04 NOTE — Telephone Encounter (Signed)
Patient was returning call for results. Please advise °

## 2022-03-05 ENCOUNTER — Ambulatory Visit (INDEPENDENT_AMBULATORY_CARE_PROVIDER_SITE_OTHER): Payer: Medicare (Managed Care) | Admitting: Student

## 2022-03-05 ENCOUNTER — Encounter: Payer: Self-pay | Admitting: Student

## 2022-03-05 VITALS — BP 118/80 | HR 55 | Temp 98.3°F | Ht 59.0 in | Wt 209.4 lb

## 2022-03-05 DIAGNOSIS — J449 Chronic obstructive pulmonary disease, unspecified: Secondary | ICD-10-CM | POA: Diagnosis not present

## 2022-03-05 MED ORDER — ALBUTEROL SULFATE HFA 108 (90 BASE) MCG/ACT IN AERS: 2.0000 | INHALATION_SPRAY | Freq: Four times a day (QID) | RESPIRATORY_TRACT | 6 refills | Status: AC | PRN

## 2022-03-05 NOTE — Progress Notes (Signed)
Spirometry pre and post done today. 

## 2022-03-05 NOTE — Patient Instructions (Addendum)
-   Please request copy of disc with CT chest on it from [facility]  - continue anoro 1 puff once daily - Take shower, clear nose of crusting and then flonase 1 spray each nostril - See you in a year or sooner if need be!

## 2022-03-06 ENCOUNTER — Telehealth: Payer: Self-pay | Admitting: Student

## 2022-03-06 NOTE — Telephone Encounter (Signed)
CD received and will be uploaded to canopy. I will place CD in Dr. Glenetta Hew box.

## 2022-03-06 NOTE — Telephone Encounter (Signed)
Noted by clinic staff. Nothing further needed

## 2022-03-06 NOTE — Telephone Encounter (Signed)
Images uploaded and available through canopy pacs for viewing. CD placed with Dr. Verlee Monte.

## 2022-03-12 NOTE — Progress Notes (Unsigned)
Office Visit    Patient Name: Sharon Cain Date of Encounter: 03/12/2022  Primary Care Provider:  Cipriano Mile, NP Primary Cardiologist:  Jenkins Rouge, MD Primary Electrophysiologist: None  Chief Complaint    Sharon Cain is a 66 y.o. female with PMH of PAF, LBBB, dilated cardiomyopathy COPD, HTN, HLD and OSA on CPAP who presents today for 1 month follow-up of elevated heart rate.  Past Medical History    Past Medical History:  Diagnosis Date   A-fib [facility]) 2017   Anxiety    Arthritis    Chest pain, central 11/15/2010   Patient admitted to hospital for cardiac rule-out. No MI.     Depression    DEPRESSION 04/02/2007   Qualifier: Diagnosis of  By: Henderson Baltimore MD, Jessica     Hyperlipidemia    Hypertension    Insomnia    Obesity    Tobacco abuse 03/30/2007   Qualifier: Diagnosis of  By: Buelah Manis MD, Lonell Grandchild     Past Surgical History:  Procedure Laterality Date   CARPAL TUNNEL RELEASE Bilateral    bilateral   CHOLECYSTECTOMY     removed before the age of 56   RIGHT/LEFT HEART CATH AND CORONARY ANGIOGRAPHY N/A 07/30/2016   Procedure: Right/Left Heart Cath and Coronary Angiography;  Surgeon: Belva Crome, MD;  Location: [place] [facility];  Service: Cardiovascular;  Laterality: N/A;    Allergies  Allergies  Allergen Reactions   Sulfa Antibiotics     Pancreatitis   Amlodipine Swelling   Lisinopril     Other reaction(s): Other   Ramipril     Other reaction(s): Other    History of Present Illness    Sharon Cain  is a 66 year old female with the above mention past medical history who presents today for complaint of elevated heart rate and increased edema.  She presented to the ED in 2017 with complaint of chest pain and irregular heartbeat.  She was found to have new onset paroxysmal AF and underwent stress test that was negative for ischemia.  2D echo was completed showing EF of 35% and diffuse hypokinesis she was seen in follow-up by Dr.Nishan was sent for  [facility] due to decreased LV function that revealed normal coronaries and right heart pressures.  She wore an event monitor for palpitations sinus with isolated PVCs and no significant arrhythmia.  PAF was managed with amiodarone and Eliquis for anticoagulation.  She was transitioned to beta-blocker from amiodarone and was seen and follow-up with no new cardiac complaints.  Repeat 2D echo 09/2018 with improved EF of 60-65% and LBBB stable with no high-grade block.  She was last seen by Dr. Johnsie Cancel 10/2020 for follow-up.  2D echo was repeated and revealed EF of 55-60% and grade 2 DD with mild MR. She was seen on 12/17/2021 by Robbie Lis, PA for follow-up and was found to have dyspnea on exertion with no chest pain complaints.  She was offered trial of diuretics but declined and decision was made to defer repeat cardiac evaluation until follow-up visit.  She was seen in follow-up 02/05/2022 for complaint of elevated heart rate.  She also noted increased swelling in her hands her smart watch showed decreased heart rate of 48.  Lasix was started at 20 mg x 3 days and then 20 mg daily.  Lexiscan Myoview was also repeated due to complaint of chest pain and showed no ischemia and normal heart pumping function with small decreased blood flow that may  be consistent with diaphragmatic artifact.  The monitor showed 4% burden of atrial fibrillation while was increased to 12.5 mg twice daily.   Since last being seen in the office patient reports***.  Patient denies chest pain, palpitations, dyspnea, PND, orthopnea, nausea, vomiting, dizziness, syncope, edema, weight gain, or early satiety.     ***Notes:  Home Medications    Current Outpatient Medications  Medication Sig Dispense Refill   albuterol (VENTOLIN HFA) 108 (90 Base) MCG/ACT inhaler Inhale 2 puffs into the lungs every 6 (six) hours as needed for wheezing or shortness of breath. 8 g 6   atorvastatin (LIPITOR) 80 MG tablet Take 0.5 tablets (40 mg total) by mouth  daily. 90 tablet 3   Black Cohosh 200 MG CAPS Take 1 capsule by mouth daily.     busPIRone (BUSPAR) 10 MG tablet Take 10 mg by mouth 3 (three) times daily as needed.     calcium carbonate (OS-CAL) 600 MG TABS tablet Take 600 mg by mouth daily.     carvedilol (COREG) 12.5 MG tablet Take 1 tablet (12.5 mg total) by mouth 2 (two) times daily. 180 tablet 3   ELIQUIS 5 MG TABS tablet TAKE 1 TABLET BY MOUTH  TWICE DAILY 180 tablet 1   escitalopram (LEXAPRO) 20 MG tablet Take 20 mg by mouth daily.     furosemide (LASIX) 20 MG tablet Take 1 tablet (20 mg total) by mouth daily. Take one tablet by mouth two times daily for 3 days then take one tablet by mouth daily. 93 tablet 3   levothyroxine (SYNTHROID) 25 MCG tablet Take 25 mcg by mouth daily.     meclizine (ANTIVERT) 12.5 MG tablet Take 1 tablet by mouth as needed for dizziness.     Melatonin 10 MG TABS Take 20 mg by mouth at bedtime as needed.     montelukast (SINGULAIR) 10 MG tablet Take 10 mg by mouth at bedtime.     ondansetron (ZOFRAN ODT) 4 MG disintegrating tablet Take 1 tablet (4 mg total) by mouth every 8 (eight) hours as needed for nausea or vomiting. 20 tablet 0   oxybutynin (DITROPAN) 5 MG tablet Take 5 mg by mouth daily.     potassium chloride (KLOR-CON) 10 MEQ tablet Take 1 tablet (10 mEq total) by mouth daily. 90 tablet 0   umeclidinium-vilanterol (ANORO ELLIPTA) 62.5-25 MCG/ACT AEPB Inhale 1 puff into the lungs daily. 60 each 11   No current facility-administered medications for this visit.     Review of Systems  Please see the history of present illness.    (+)*** (+)***  All other systems reviewed and are otherwise negative except as noted above.  Physical Exam    Wt Readings from Last 3 Encounters:  03/05/22 209 lb 6.4 oz (95 kg)  02/13/22 213 lb (96.6 kg)  02/05/22 213 lb 12.8 oz (97 kg)   SF:KCLEX were no vitals filed for this visit.,There is no height or weight on file to calculate BMI.  Constitutional:       Appearance: Healthy appearance. Not in distress.  Neck:     Vascular: JVD normal.  Pulmonary:     Effort: Pulmonary effort is normal.     Breath sounds: No wheezing. No rales. Diminished in the bases Cardiovascular:     Normal rate. Regular rhythm. Normal S1. Normal S2.      Murmurs: There is no murmur.  Edema:    Peripheral edema absent.  Abdominal:     Palpations: Abdomen is soft  non tender. There is no hepatomegaly.  Skin:    General: Skin is warm and dry.  Neurological:     General: No focal deficit present.     Mental Status: Alert and oriented to person, place and time.     Cranial Nerves: Cranial nerves are intact.  EKG/LABS/Other Studies Reviewed    ECG personally reviewed by me today - ***  Risk Assessment/Calculations:   {Does this patient have ATRIAL FIBRILLATION?:[id number]}        Lab Results  Component Value Date   WBC 4.3 08/15/2021   HGB 14.2 08/15/2021   HCT 41.7 08/15/2021   MCV 92 08/15/2021   PLT 154 08/15/2021   Lab Results  Component Value Date   CREATININE 0.67 02/13/2022   BUN 12 02/13/2022   NA 143 02/13/2022   K 4.3 02/13/2022   CL 106 02/13/2022   CO2 23 02/13/2022   Lab Results  Component Value Date   ALT 66 (H) 09/29/2019   AST 27 09/29/2019   ALKPHOS 84 09/29/2019   BILITOT 1.8 (H) 09/29/2019   Lab Results  Component Value Date   CHOL 107 09/16/2019   HDL 33 (L) 09/16/2019   LDLCALC 44 09/16/2019   LDLDIRECT 149 (H) 02/27/2010   TRIG 181 (H) 09/16/2019   CHOLHDL 3.2 09/16/2019    Lab Results  Component Value Date   HGBA1C 5.8 (A) 09/16/2019    Assessment & Plan    1.HFimEF: -EF in 2017 was 35% and has improved to 55-60% based on recent echo completed 10/2020 -She has chronic dyspnea exertion and reports she is experienced an increase over the last 3 weeks with ambulation. -She is slightly volume up on exam with trace edema in hands and lower extremities. -She reports compliance with her current medication and denies  any indiscretions with salt. -We will start Lasix 20 mg twice daily x3 days and then 20 mg daily -We will start K-Dur 10 mEq daily -We will check a BNP and a BMET today  -We will recheck BMET and Mg in 1 week   2.  History of PAF: -Patient is currently rate controlled with carvedilol 6.25 mg and reports no bleeding with Eliquis 5 mg twice daily -Dose is currently appropriate based on patient's age and current creatinine of 0.61. -CHA2DS2-VASc Score = 4 [CHF History: 1, HTN History: 1, Diabetes History: 0, Stroke History: 0, Vascular Disease History: 0, Age Score: 1, Gender Score: 1].  Therefore, the patient's annual risk of stroke is 4.8 %.       3.  Essential hypertension: -Patient's blood pressure today was elevated initially at 140/80 and was 124/78 on recheck. -She was advised to check her blood pressures at home.  We will plan to increase carvedilol to 12.5 twice daily if pressures remain elevated.   4.  History of COPD: -She is currently followed by pulmonology and is planning to start new inhaler today due to increased shortness of breath.   5.  Stable angina: -She reports that she is experienced chest pain with emotional upset over the past few weeks.  She denies any associated symptoms of dizziness or palpitations with this chest discomfort. -We will repeat Lexiscan Myoview to evaluate for possible ischemia as cause for chest pain. -She was advised to follow-up in the ED if chest pain reoccurs and is not relieved with rest.   6.  Bradycardia: -Patient reports decreased heart rate in the 40s however is asymptomatic. -We will order 14-day ZIO monitor to evaluate  for possible nocturnal pauses and arrhythmia.      Disposition: Follow-up with Jenkins Rouge, MD or APP in *** months {Are you ordering a CV Procedure (e.g. stress test, cath, DCCV, TEE, etc)?   Press F2        :[id number]}   Medication Adjustments/Labs and Tests Ordered: Current medicines are reviewed at length with the  patient today.  Concerns regarding medicines are outlined above.   Signed, Mable Fill, Marissa Nestle, NP 03/12/2022, 1:18 PM [facility]  Note:  This document was prepared using Dragon voice recognition software and may include unintentional dictation errors.

## 2022-03-13 ENCOUNTER — Encounter: Payer: Self-pay | Admitting: Nurse Practitioner

## 2022-03-13 ENCOUNTER — Ambulatory Visit: Payer: Medicare (Managed Care) | Attending: Nurse Practitioner | Admitting: Nurse Practitioner

## 2022-03-13 VITALS — BP 118/70 | HR 60 | Ht 59.0 in | Wt 208.0 lb

## 2022-03-13 DIAGNOSIS — J439 Emphysema, unspecified: Secondary | ICD-10-CM | POA: Diagnosis not present

## 2022-03-13 DIAGNOSIS — I10 Essential (primary) hypertension: Secondary | ICD-10-CM

## 2022-03-13 DIAGNOSIS — I5022 Chronic systolic (congestive) heart failure: Secondary | ICD-10-CM

## 2022-03-13 DIAGNOSIS — I2089 Other forms of angina pectoris: Secondary | ICD-10-CM

## 2022-03-13 DIAGNOSIS — I480 Paroxysmal atrial fibrillation: Secondary | ICD-10-CM

## 2022-03-13 LAB — PULMONARY FUNCTION TEST
FEF 25-75 Post: 1.62 L/sec
FEF 25-75 Pre: 1.39 L/sec
FEF2575-%Change-Post: 16 %
FEF2575-%Pred-Post: 89 %
FEF2575-%Pred-Pre: 76 %
FEV1-%Change-Post: 3 %
FEV1-%Pred-Post: 85 %
FEV1-%Pred-Pre: 82 %
FEV1-Post: 1.7 L
FEV1-Pre: 1.64 L
FEV1FVC-%Change-Post: 0 %
FEV1FVC-%Pred-Pre: 101 %
FEV6-%Change-Post: 2 %
FEV6-%Pred-Post: 86 %
FEV6-%Pred-Pre: 84 %
FEV6-Post: 2.16 L
FEV6-Pre: 2.11 L
FEV6FVC-%Pred-Post: 104 %
FEV6FVC-%Pred-Pre: 104 %
FVC-%Change-Post: 2 %
FVC-%Pred-Post: 82 %
FVC-%Pred-Pre: 80 %
FVC-Post: 2.16 L
FVC-Pre: 2.11 L
Post FEV1/FVC ratio: 79 %
Post FEV6/FVC ratio: 100 %
Pre FEV1/FVC ratio: 78 %
Pre FEV6/FVC Ratio: 100 %

## 2022-03-13 NOTE — Patient Instructions (Addendum)
Medication Instructions:  Your physician recommends that you continue on your current medications as directed. Please refer to the Current Medication list given to you today.  *If you need a refill on your cardiac medications before your next appointment, please call your pharmacy*  Follow-Up: At [facility], you and your health needs are our priority.  As part of our continuing mission to provide you with exceptional heart care, we have created designated Provider Care Teams.  These Care Teams include your primary Cardiologist (physician) and Advanced Practice Providers (APPs -  Physician Assistants and Nurse Practitioners) who all work together to provide you with the care you need, when you need it.  Your next appointment:   March 25th at 8:00am  The format for your next appointment:   In Person  Provider:   Jenkins Rouge, MD     Important Information About Sugar

## 2022-04-11 ENCOUNTER — Other Ambulatory Visit: Payer: Self-pay | Admitting: Nurse Practitioner

## 2022-05-20 ENCOUNTER — Other Ambulatory Visit: Payer: Self-pay | Admitting: *Deleted

## 2022-05-20 DIAGNOSIS — I480 Paroxysmal atrial fibrillation: Secondary | ICD-10-CM

## 2022-05-20 MED ORDER — APIXABAN 5 MG PO TABS: 5.0000 mg | ORAL_TABLET | Freq: Two times a day (BID) | ORAL | 3 refills | Status: DC

## 2022-05-20 NOTE — Telephone Encounter (Signed)
Prescription refill request for Eliquis received. Indication: Afib  Last office visit: 12/202/23 Sharon Cain)  Scr: 0.67 (02/13/22)  Age: 67 Weight: 94.3kg  Appropriate dose. Refill sent.

## 2022-05-24 ENCOUNTER — Other Ambulatory Visit (HOSPITAL_COMMUNITY): Payer: Self-pay

## 2022-06-10 NOTE — Progress Notes (Signed)
Evaluation Performed:  Follow-up visit  Date:  06/17/2022   ID:  Sharon Cain, DOB [birth year], MRN [id number]  PCP:  Cipriano Mile, NP  Cardiologist:  Johnsie Cancel Electrophysiologist:  None   Chief Complaint:  DCM  History of Present Illness:    Sharon Cain is a 67 y.o. female with a  istory of PAF, HTN, OSA on CPAP chronic LBBB Non ischemic DCM mild Normal cath 07/30/16 with EF 40-45%. Allergic to ACE and has leg pains with norvasc Maintained on beta blockers and eliquis Activity limited by chronic heel pain and right distal fibula avulsion fracture  No cardiac complaints Compliant with meds  Last echo a year ag 11/13/20 with EF 55-60% mild MR Myovue 02/13/22 low risk no ischemia EF estimated 42% but noted frequent PVC;s Monitor 02/28/22 with low burden PAF and 9.4% PVC burden   Her coreg dose was increased by PA Mg/K were normal 02/13/22   I take care of her mom and saw her husband in hospital with volume overload And on dialysis now   She use to work at [employer]  Has some insomnia and fatty liver with previous pancreatitis post GB removal [place] only showed hepatic steatosis   No heart issues Mom died in 02-08-23 Husband memory worse on dialysis and doesn't get out of house much    Past Medical History:  Diagnosis Date   A-fib [facility]) 2017   Anxiety    Arthritis    Chest pain, central 11/15/2010   Patient admitted to hospital for cardiac rule-out. No MI.     Depression    DEPRESSION 04/02/2007   Qualifier: Diagnosis of  By: Henderson Baltimore MD, Jessica     Hyperlipidemia    Hypertension    Insomnia    Obesity    Tobacco abuse 03/30/2007   Qualifier: Diagnosis of  By: Buelah Manis MD, Lonell Grandchild     Past Surgical History:  Procedure Laterality Date   CARPAL TUNNEL RELEASE Bilateral    bilateral   CHOLECYSTECTOMY     removed before the age of 10   RIGHT/LEFT HEART CATH AND CORONARY ANGIOGRAPHY N/A 07/30/2016   Procedure: Right/Left Heart Cath and Coronary Angiography;  Surgeon: Belva Crome, MD;  Location: [place] [facility];  Service: Cardiovascular;  Laterality: N/A;     Current Meds  Medication Sig   albuterol (VENTOLIN HFA) 108 (90 Base) MCG/ACT inhaler Inhale 2 puffs into the lungs every 6 (six) hours as needed for wheezing or shortness of breath.   apixaban (ELIQUIS) 5 MG TABS tablet Take 1 tablet (5 mg total) by mouth 2 (two) times daily.   atorvastatin (LIPITOR) 80 MG tablet Take 0.5 tablets (40 mg total) by mouth daily.   Black Cohosh 200 MG CAPS Take 1 capsule by mouth daily.   busPIRone (BUSPAR) 10 MG tablet Take 10 mg by mouth 3 (three) times daily as needed.   calcium carbonate (OS-CAL) 600 MG TABS tablet Take 600 mg by mouth daily.   carvedilol (COREG) 12.5 MG tablet Take 1 tablet (12.5 mg total) by mouth 2 (two) times daily.   escitalopram (LEXAPRO) 20 MG tablet Take 20 mg by mouth daily.   ezetimibe (ZETIA) 10 MG tablet Take 10 mg by mouth daily.   furosemide (LASIX) 20 MG tablet Take 1 tablet (20 mg total) by mouth daily. Take one tablet by mouth two times daily for 3 days then take one tablet by mouth daily.   levothyroxine (SYNTHROID) 25 MCG tablet Take 25  mcg by mouth daily.   meclizine (ANTIVERT) 12.5 MG tablet Take 1 tablet by mouth as needed for dizziness.   Melatonin 10 MG TABS Take 20 mg by mouth at bedtime as needed.   montelukast (SINGULAIR) 10 MG tablet Take 10 mg by mouth at bedtime.   ondansetron (ZOFRAN ODT) 4 MG disintegrating tablet Take 1 tablet (4 mg total) by mouth every 8 (eight) hours as needed for nausea or vomiting.   oxybutynin (DITROPAN) 5 MG tablet Take 5 mg by mouth daily.   potassium chloride (KLOR-CON) 10 MEQ tablet Take 1 tablet by mouth once daily   tirzepatide (MOUNJARO) 2.5 MG/0.5ML Pen Inject 2.5 mg into the skin once a week. Pt states she will start 5mg  next Tues   umeclidinium-vilanterol (ANORO ELLIPTA) 62.5-25 MCG/ACT AEPB Inhale 1 puff into the lungs daily.     Allergies:   Sulfa antibiotics, Amlodipine,  Lisinopril, and Ramipril   Social History   Tobacco Use   Smoking status: Former    Packs/day: 1.00    Years: 40.00    Additional pack years: 0.00    Total pack years: 40.00    Types: Cigarettes    Quit date: 12/09/2015    Years since quitting: 6.5    Passive exposure: Past   Smokeless tobacco: Never  Vaping Use   Vaping Use: Never used  Substance Use Topics   Alcohol use: No    Alcohol/week: 0.0 standard drinks of alcohol   Drug use: No     Family Hx: [name] includes Atrial fibrillation in her mother; COPD in her father and mother; Congestive Heart Failure in her father; Diabetes in her mother; Heart failure in her mother; Sudden Cardiac Death in her father.  ROS:   Please see the history of present illness.     All other systems reviewed and are negative.   Prior CV studies:   The following studies were reviewed today:  Cath 07/30/16  Echo 10/08/18  Labs/Other Tests and Data Reviewed:    EKG:   Sinus rhythm rate 54 LBBB chornic 07/28/16  08/27/19 SR rate 60 IVCD chronic T wave changes   Recent Labs: 08/15/2021: Hemoglobin 14.2; Platelets 154 02/13/2022: BUN 12; Creatinine, Ser 0.67; Magnesium 2.0; Potassium 4.3; Sodium 143   Recent Lipid Panel Lab Results  Component Value Date/Time   CHOL 107 09/16/2019 01:34 PM   TRIG 181 (H) 09/16/2019 01:34 PM   HDL 33 (L) 09/16/2019 01:34 PM   CHOLHDL 3.2 09/16/2019 01:34 PM   CHOLHDL 3.1 01/14/2016 09:08 AM   LDLCALC 44 09/16/2019 01:34 PM   LDLDIRECT 149 (H) 02/27/2010 08:15 PM    Wt Readings from Last 3 Encounters:  06/17/22 196 lb 6.4 oz (89.1 kg)  03/13/22 208 lb (94.3 kg)  03/05/22 209 lb 6.4 oz (95 kg)     Objective:    Vital Signs:  BP 100/60   Pulse 80   Ht 4\' 11"  (1.499 m)   Wt 196 lb 6.4 oz (89.1 kg)   SpO2 91%   BMI 39.67 kg/m    Affect appropriate Overweight white female  HEENT: poor dentition  Neck supple with no adenopathy JVP normal no bruits no thyromegaly Lungs clear  with no wheezing and good diaphragmatic motion Heart:  S1/S2 no murmur, no rub, gallop or click PMI normal Abdomen: benighn, BS positve, no tenderness, no AAA no bruit.  No HSM or HJR Distal pulses intact with no bruits No edema Neuro non-focal Skin warm and dry  No muscular weakness   ASSESSMENT & PLAN:    DCM:  Functional class one improved  EF 55-60% by TTE  02/13/22  LBBB chronic no high grade AV block yearly ECG PAF:  Maintaining NSR continue eliquis and beta blocker Low burden on monitor 02/28/22   Ortho: chronic heal pain new right fibula avulsion fracture   Asthma:  ? Exercise induced Albuterol inhaler called in  PVCls in setting of normal EF non ischemic myovue 02/13/22 better with higher dose coreg   COVID-19 Education: The signs and symptoms of COVID-19 were discussed with the patient and how to seek care for testing (follow up with PCP or arrange E-visit).  The importance of social distancing was discussed today.     Medication Adjustments/Labs and Tests Ordered: Current medicines are reviewed at length with the patient today.  Concerns regarding medicines are outlined above.   Tests Ordered:  None  Medication Changes: No orders of the defined types were placed in this encounter.  None  Disposition:  Follow up in a year   Signed, Jenkins Rouge, MD  06/17/2022 8:04 AM    [place]

## 2022-06-17 ENCOUNTER — Encounter: Payer: Self-pay | Admitting: Cardiovascular Disease

## 2022-06-17 ENCOUNTER — Ambulatory Visit: Payer: 59 | Attending: Cardiovascular Disease | Admitting: Cardiovascular Disease

## 2022-06-17 VITALS — BP 100/60 | HR 80 | Ht 59.0 in | Wt 196.4 lb

## 2022-06-17 DIAGNOSIS — I5022 Chronic systolic (congestive) heart failure: Secondary | ICD-10-CM

## 2022-06-17 DIAGNOSIS — I10 Essential (primary) hypertension: Secondary | ICD-10-CM

## 2022-06-17 DIAGNOSIS — I447 Left bundle-branch block, unspecified: Secondary | ICD-10-CM | POA: Diagnosis not present

## 2022-06-17 DIAGNOSIS — I480 Paroxysmal atrial fibrillation: Secondary | ICD-10-CM

## 2022-06-17 NOTE — Patient Instructions (Signed)
Medication Instructions:  Your physician recommends that you continue on your current medications as directed. Please refer to the Current Medication list given to you today.  *If you need a refill on your cardiac medications before your next appointment, please call your pharmacy*  Lab Work: If you have labs (blood work) drawn today and your tests are completely normal, you will receive your results only by: MyChart Message (if you have MyChart) OR A paper copy in the mail If you have any lab test that is abnormal or we need to change your treatment, we will call you to review the results.  Testing/Procedures: None ordered today.  Follow-Up: At  [facility]Care, you and your health needs are our priority.  As part of our continuing mission to provide you with exceptional heart care, we have created designated Provider Care Teams.  These Care Teams include your primary Cardiologist (physician) and Advanced Practice Providers (APPs -  Physician Assistants and Nurse Practitioners) who all work together to provide you with the care you need, when you need it.  We recommend signing up for the patient portal called "MyChart".  Sign up information is provided on this After Visit Summary.  MyChart is used to connect with patients for Virtual Visits (Telemedicine).  Patients are able to view lab/test results, encounter notes, upcoming appointments, etc.  Non-urgent messages can be sent to your provider as well.   To learn more about what you can do with MyChart, go to [email or web address].    Your next appointment:   1 year(s)  Provider:   Peter Nishan, MD      

## 2022-08-01 ENCOUNTER — Other Ambulatory Visit: Payer: Self-pay | Admitting: Cardiovascular Disease

## 2022-08-14 ENCOUNTER — Other Ambulatory Visit: Payer: Self-pay | Admitting: Student

## 2022-08-14 ENCOUNTER — Ambulatory Visit
Admission: RE | Admit: 2022-08-14 | Discharge: 2022-08-14 | Disposition: A | Discharge status: Home / Self Care | Payer: 59 | Source: Ambulatory Visit | Attending: Student | Admitting: Student

## 2022-08-14 DIAGNOSIS — J069 Acute upper respiratory infection, unspecified: Secondary | ICD-10-CM

## 2022-08-16 ENCOUNTER — Telehealth: Payer: Self-pay | Admitting: Cardiovascular Disease

## 2022-08-16 NOTE — Telephone Encounter (Signed)
Called pt back. She reports been having some afib since PCP started her on Prednisone for continued illness/congestion. Reports HRs avg 70s-95, baseline is 60s.  Pt aware that Prednisone is probably the precipitating factor. Pt is feeling ok currently, but advised to call office after hours/over weekend if symptoms reoccur and/or HRs creep above 100 bpm. She is currently at her PCP for an EKG per their request.  They also placed a monitor on her yesterday.  She has not taken any Prednisone today, and they are discussing if she will remain on it or not. She will call back if we need to address afib/symptoms if reoccurs/worsens. Pt agreeable to plan.

## 2022-08-16 NOTE — Telephone Encounter (Signed)
Left message to call back  

## 2022-08-16 NOTE — Telephone Encounter (Signed)
Patient c/o Palpitations:  High priority if patient c/o lightheadedness, shortness of breath, or chest pain  How long have you had palpitations/irregular HR/ Afib? Are you having the symptoms now?  Patient states on 5/22 she saw PCP and they gave her prednisone. On 5/23 am she started taking the pills. She now has palpitations that radiate up the right side of her neck whenever she walks around. She also becomes SOB. She states she is fine when sitting. Patient mentions that she read that a Prednisone side effect is arrhythmias.  Are you currently experiencing lightheadedness, SOB or CP?  Not currently, but patient has been SOB  Do you have a history of afib (atrial fibrillation) or irregular heart rhythm?  Patient has afib  Have you checked your BP or HR? (document readings if available):  5/24: 98 HR  Are you experiencing any other symptoms?  No

## 2022-11-20 ENCOUNTER — Other Ambulatory Visit: Payer: Self-pay | Admitting: Student

## 2022-11-20 DIAGNOSIS — K3184 Gastroparesis: Secondary | ICD-10-CM

## 2022-11-22 ENCOUNTER — Ambulatory Visit
Admission: RE | Admit: 2022-11-22 | Discharge: 2022-11-22 | Disposition: A | Discharge status: Home / Self Care | Payer: 59 | Source: Ambulatory Visit | Attending: Student | Admitting: Student

## 2022-11-22 DIAGNOSIS — K3184 Gastroparesis: Secondary | ICD-10-CM

## 2022-12-05 ENCOUNTER — Other Ambulatory Visit: Payer: Self-pay | Admitting: Student

## 2022-12-05 DIAGNOSIS — J449 Chronic obstructive pulmonary disease, unspecified: Secondary | ICD-10-CM

## 2022-12-06 LAB — LAB REPORT - SCANNED
EGFR: 96
HM Hepatitis Screen: NEGATIVE

## 2022-12-27 ENCOUNTER — Inpatient Hospital Stay: Admission: RE | Admit: 2022-12-27 | Payer: 59 | Source: Ambulatory Visit

## 2023-01-09 ENCOUNTER — Ambulatory Visit
Admission: RE | Admit: 2023-01-09 | Discharge: 2023-01-09 | Disposition: A | Discharge status: Home / Self Care | Payer: 59 | Source: Ambulatory Visit | Attending: Student | Admitting: Student

## 2023-01-09 DIAGNOSIS — J449 Chronic obstructive pulmonary disease, unspecified: Secondary | ICD-10-CM

## 2023-01-25 ENCOUNTER — Other Ambulatory Visit: Payer: Self-pay | Admitting: Cardiovascular Disease

## 2023-01-25 ENCOUNTER — Other Ambulatory Visit: Payer: Self-pay | Admitting: Nurse Practitioner

## 2023-02-18 ENCOUNTER — Encounter (HOSPITAL_COMMUNITY): Payer: Self-pay

## 2023-02-18 ENCOUNTER — Ambulatory Visit (HOSPITAL_COMMUNITY)
Admission: RE | Admit: 2023-02-18 | Discharge: 2023-02-18 | Disposition: A | Discharge status: Home / Self Care | Payer: 59 | Source: Ambulatory Visit | Attending: Nurse Practitioner | Admitting: Nurse Practitioner

## 2023-02-18 VITALS — BP 108/73 | HR 49 | Temp 98.6°F | Resp 17

## 2023-02-18 DIAGNOSIS — L03115 Cellulitis of right lower limb: Secondary | ICD-10-CM

## 2023-02-18 DIAGNOSIS — W540XXA Bitten by dog, initial encounter: Secondary | ICD-10-CM

## 2023-02-18 DIAGNOSIS — Z23 Encounter for immunization: Secondary | ICD-10-CM | POA: Diagnosis not present

## 2023-02-18 MED ORDER — TETANUS-DIPHTH-ACELL PERTUSSIS 5-2.5-18.5 LF-MCG/0.5 IM SUSY
PREFILLED_SYRINGE | INTRAMUSCULAR | Status: AC
  Filled 2023-02-18: qty 0.5

## 2023-02-18 MED ORDER — FLUCONAZOLE 150 MG PO TABS: 150.0000 mg | ORAL_TABLET | Freq: Once | ORAL | 0 refills | Status: AC

## 2023-02-18 MED ORDER — TETANUS-DIPHTH-ACELL PERTUSSIS 5-2.5-18.5 LF-MCG/0.5 IM SUSY
0.5000 mL | PREFILLED_SYRINGE | Freq: Once | INTRAMUSCULAR | Status: AC
  Administered 2023-02-18: 0.5 mL via INTRAMUSCULAR

## 2023-02-18 MED ORDER — AMOXICILLIN-POT CLAVULANATE 875-125 MG PO TABS: 1.0000 | ORAL_TABLET | Freq: Two times a day (BID) | ORAL | 0 refills | Status: AC

## 2023-02-18 NOTE — Discharge Instructions (Signed)
We have updated your tetanus shot today.  Please start taking the Augmentin and take as prescribed, twice daily for 7 days to treat for infection in your skin from the dog bite.  If you develop yeast infection symptoms, at the end of treatment, take the fluconazole pill.  Clean the area twice daily with mild soap and water.  You can apply a thin layer of petroleum jelly or A&E ointment or Aquaphor over the scabbed areas to help keep them moist and allow for healing.  Seek care if symptoms do not improve with treatment.

## 2023-02-18 NOTE — ED Triage Notes (Signed)
Pt presents after her dog bit her right leg about a week ago. Bit is red infected and swollen

## 2023-02-18 NOTE — ED Provider Notes (Signed)
[facility]    CSN: [id number] Arrival date & time: 02/18/23  1139      History   Chief Complaint Chief Complaint  Patient presents with   Leg Injury    Entered by patient    HPI Sharon Cain is a 67 y.o. female.   Patient presents today for dog bite to right lower leg that is red, swollen, and she is worried may be infected.  States that she was walking down her porch steps when she started to fall and she thinks her dog tried to prevent her from falling by grabbing onto her leg.  The dog is not typically aggressive.  Reports the dog bite occurred about 1 week ago and she has been applying iodine and Betadine to it, keeping it covered with a bandage without much improvement.  Reports the dog belongs to her and she is not concerned for rabies today.  She denies fevers or nausea/vomiting.  No chest pain or shortness of breath.  Reports that she has a low heart rate normally.  Last Tdap unknown.  Patient denies allergies to antibiotic therapy, although she does state that she commonly gets yeast infections with amoxicillin.    Past Medical History:  Diagnosis Date   A-fib [facility]) 2017   Anxiety    Arthritis    Chest pain, central 11/15/2010   Patient admitted to hospital for cardiac rule-out. No MI.     Depression    DEPRESSION 04/02/2007   Qualifier: Diagnosis of  By: Luz Brazen MD, Shakina Choy     Hyperlipidemia    Hypertension    Insomnia    Obesity    Tobacco abuse 03/30/2007   Qualifier: Diagnosis of  By: Jeanice Lim MD, [facility]      Patient Active Problem List   Diagnosis Date Noted   Elevated ALT measurement 10/01/2019   BMI 40.0-44.9, adult (HCC) 09/20/2019   Chronic nasal congestion 09/20/2019   Vertigo 09/20/2019   Obstructive sleep apnea treated with continuous positive airway pressure (CPAP) 07/22/2017   Chronic systolic CHF (congestive heart failure) (HCC) 01/15/2016   Hyperglycemia 12/12/2015   Atrial fibrillation (HCC) 12/10/2015   OBESITY 07/26/2009    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      Home Medications    Prior to Admission medications   Medication Sig Start Date End Date Taking? Authorizing Provider  amoxicillin-clavulanate (AUGMENTIN) 875-125 MG tablet Take 1 tablet by mouth 2 (two) times daily for 7 days. 02/18/23 02/25/23 Yes Valentino Nose, NP  fluconazole (DIFLUCAN) 150 MG tablet Take 1 tablet (150 mg total) by mouth once for 1 dose. Take at end of antibiotic treatment if needed for yeast infection 02/18/23 02/18/23 Yes Valentino Nose, NP  albuterol (VENTOLIN HFA) 108 (90 Base) MCG/ACT inhaler Inhale 2 puffs into the lungs every 6 (six) hours as needed for wheezing or shortness of breath. 03/05/22   Omar Person, MD  apixaban (ELIQUIS) 5 MG TABS tablet  Take 1 tablet (5 mg total) by mouth 2 (two) times daily. 05/20/22   Wendall Stade, MD  atorvastatin (LIPITOR) 80 MG tablet Take 0.5 tablets (40 mg total) by mouth daily. 09/20/19   Melene Plan, MD  Black Cohosh 200 MG CAPS Take 1 capsule by mouth daily.    [provider]  busPIRone (BUSPAR) 10 MG tablet Take 10 mg by mouth 3 (three) times daily as needed.    [provider]  calcium carbonate (OS-CAL) 600 MG TABS tablet Take 600 mg by mouth daily.    [provider]  carvedilol (COREG) 12.5 MG tablet Take 1 tablet by mouth twice daily 01/27/23   Wendall Stade, MD  escitalopram (LEXAPRO) 20 MG tablet Take 20 mg by mouth  daily.    [provider]  ezetimibe (ZETIA) 10 MG tablet Take 10 mg by mouth daily. 03/04/22   [provider]  furosemide (LASIX) 20 MG tablet TAKE ONE TABLET BY MOUTH DAILY. TAKE ONE TABLET BY MOUTH TWICE DAILY FOR 3 DAYS THEN TAKE ONE TABLET BY MOUTH DAILY. 01/27/23   Wendall Stade, MD  levothyroxine (SYNTHROID) 25 MCG tablet Take 25 mcg by mouth daily. 09/07/20   [provider]  meclizine (ANTIVERT) 12.5 MG tablet Take 1 tablet by mouth as needed for dizziness. 06/30/18   [provider]  Melatonin 10 MG TABS Take 20 mg by mouth at bedtime as needed.    [provider]  montelukast (SINGULAIR) 10 MG tablet Take 10 mg by mouth at bedtime. 11/02/21   [provider]  ondansetron (ZOFRAN ODT) 4 MG disintegrating tablet Take 1 tablet (4 mg total) by mouth every 8 (eight) hours as needed for nausea or vomiting. 07/27/16   Ward, Layla Maw, DO  oxybutynin (DITROPAN) 5 MG tablet Take 5 mg by mouth daily.    [provider]  potassium chloride (KLOR-CON) 10 MEQ tablet Take 1 tablet by mouth once daily 08/01/22   Wendall Stade, MD  tirzepatide [facility]) 2.5 MG/0.5ML Pen Inject 2.5 mg into the skin once a week. Pt states she will start 5mg  next Tues    [provider]  umeclidinium-vilanterol (ANORO ELLIPTA) 62.5-25 MCG/ACT AEPB Inhale 1 puff into the lungs daily. 02/07/22   Omar Person, MD    Family History Family History  Problem Relation Age of Onset   Atrial fibrillation Mother    Diabetes Mother    Heart failure Mother    COPD Mother    COPD Father    Congestive Heart Failure Father    Sudden Cardiac Death Father     Social History Social History   Tobacco Use   Smoking status: Former    Current packs/day: 0.00    Average packs/day: 1 pack/day for 40.0 years (40.0 ttl pk-yrs)    Types: Cigarettes    Start date: 12/09/1975    Quit date: 12/09/2015    Years since quitting: 7.2    Passive exposure: Past    Smokeless tobacco: Never  Vaping Use   Vaping status: Never Used  Substance Use Topics   Alcohol use: No    Alcohol/week: 0.0 standard drinks of alcohol   Drug use: No     Allergies   Sulfa antibiotics, Amlodipine, Lisinopril, and Ramipril   Review of Systems Review of Systems Per HPI  Physical Exam Triage Vital Signs ED Triage Vitals  Encounter Vitals Group     BP 02/18/23 1233 108/73  Systolic BP Percentile --      Diastolic BP Percentile --      Pulse Rate 02/18/23 1233 (!) 49     Resp 02/18/23 1233 17     Temp 02/18/23 1233 98.6 F (37 C)     Temp Source 02/18/23 1233 Oral     SpO2 02/18/23 1233 93 %     Weight --      Height --      Head Circumference --      Peak Flow --      Pain Score 02/18/23 1231 0     Pain Loc --      Pain Education --      Exclude from Growth Chart --    No data found.  Updated Vital Signs BP 108/73   Pulse (!) 49   Temp 98.6 F (37 C) (Oral)   Resp 17   SpO2 93%   Visual Acuity Right Eye Distance:   Left Eye Distance:   Bilateral Distance:    Right Eye Near:   Left Eye Near:    Bilateral Near:     Physical Exam Vitals and nursing note reviewed.  Constitutional:      General: She is not in acute distress.    Appearance: Normal appearance. She is not toxic-appearing.  HENT:     Mouth/Throat:     Mouth: Mucous membranes are moist.     Pharynx: Oropharynx is clear.  Cardiovascular:     Rate and Rhythm: Bradycardia present.  Pulmonary:     Effort: Pulmonary effort is normal. No respiratory distress.     Breath sounds: Normal breath sounds. No wheezing, rhonchi or rales.  Skin:    General: Skin is warm and dry.     Capillary Refill: Capillary refill takes less than 2 seconds.     Findings: Wound present.     Comments: 2 irregularly shaped scabbed over wounds noted to right lower extremity in approximately area marked with surrounding erythema and warmth.  There is minimal drainage from the more lateral wound.  No  fluctuance.  Distal right lower extremity is neurovascularly intact.  Neurological:     Mental Status: She is alert and oriented to person, place, and time.  Psychiatric:        Behavior: Behavior is cooperative.      UC Treatments / Results  Labs (all labs ordered are listed, but only abnormal results are displayed) Labs Reviewed - No data to display  EKG   Radiology No results found.  Procedures Procedures (including critical care time)  Medications Ordered in UC Medications  Tdap (BOOSTRIX) injection 0.5 mL (0.5 mLs Intramuscular Given 02/18/23 1250)    Initial Impression / Assessment and Plan / UC Course  I have reviewed the triage vital signs and the nursing notes.  Pertinent labs & imaging results that were available during my care of the patient were reviewed by me and considered in my medical decision making (see chart for details).   Patient is well-appearing, normotensive, afebrile, not tachycardic, not tachypneic, oxygenating well on room air.  Heart rate is 48 today-last EKG shows sinus bradycardia with heart rate of 50 bpm.  1. Dog bite, initial encounter 2. Cellulitis of right lower extremity Vitals and exam reassuring, no indication of compartment syndrome Treat with Augmentin twice daily for 14 days Prescription sent in for fluconazole to 50 mg if patient develops yeast infection symptoms after completing antibiotic therapy Tdap updated Wound care discussed Return and ER  precautions also discussed  The patient was given the opportunity to ask questions.  All questions answered to their satisfaction.  The patient is in agreement to this plan.    Final Clinical Impressions(s) / UC Diagnoses   Final diagnoses:  Dog bite, initial encounter  Cellulitis of right lower extremity     Discharge Instructions      We have updated your tetanus shot today.  Please start taking the Augmentin and take as prescribed, twice daily for 7 days to treat for  infection in your skin from the dog bite.  If you develop yeast infection symptoms, at the end of treatment, take the fluconazole pill.  Clean the area twice daily with mild soap and water.  You can apply a thin layer of petroleum jelly or A&E ointment or Aquaphor over the scabbed areas to help keep them moist and allow for healing.  Seek care if symptoms do not improve with treatment.     ED Prescriptions     Medication Sig Dispense Auth. Provider   amoxicillin-clavulanate (AUGMENTIN) 875-125 MG tablet Take 1 tablet by mouth 2 (two) times daily for 7 days. 14 tablet Cathlean Marseilles A, NP   fluconazole (DIFLUCAN) 150 MG tablet Take 1 tablet (150 mg total) by mouth once for 1 dose. Take at end of antibiotic treatment if needed for yeast infection 1 tablet Valentino Nose, NP      PDMP not reviewed this encounter.   Valentino Nose, NP 02/18/23 1253

## 2023-03-08 ENCOUNTER — Encounter (HOSPITAL_COMMUNITY): Payer: Self-pay | Admitting: *Deleted

## 2023-03-08 ENCOUNTER — Other Ambulatory Visit: Payer: Self-pay

## 2023-03-08 ENCOUNTER — Emergency Department (HOSPITAL_COMMUNITY)
Admission: EM | Admit: 2023-03-08 | Discharge: 2023-03-09 | Disposition: A | Discharge status: Home / Self Care | Payer: 59 | Attending: Emergency Medicine | Admitting: Emergency Medicine

## 2023-03-08 DIAGNOSIS — X58XXXA Exposure to other specified factors, initial encounter: Secondary | ICD-10-CM | POA: Insufficient documentation

## 2023-03-08 DIAGNOSIS — R58 Hemorrhage, not elsewhere classified: Secondary | ICD-10-CM | POA: Diagnosis not present

## 2023-03-08 DIAGNOSIS — Z7901 Long term (current) use of anticoagulants: Secondary | ICD-10-CM | POA: Insufficient documentation

## 2023-03-08 DIAGNOSIS — R1030 Lower abdominal pain, unspecified: Secondary | ICD-10-CM | POA: Diagnosis present

## 2023-03-08 DIAGNOSIS — T148XXA Other injury of unspecified body region, initial encounter: Secondary | ICD-10-CM | POA: Diagnosis not present

## 2023-03-08 MED ORDER — OXYCODONE-ACETAMINOPHEN 5-325 MG PO TABS
1.0000 | ORAL_TABLET | Freq: Once | ORAL | Status: AC
  Administered 2023-03-08: 1 via ORAL
  Filled 2023-03-08: qty 1

## 2023-03-08 NOTE — ED Triage Notes (Signed)
The pt has had an abscess since last Wednesday in her lt groin

## 2023-03-08 NOTE — ED Notes (Signed)
Called for registration, no answer

## 2023-03-09 DIAGNOSIS — T148XXA Other injury of unspecified body region, initial encounter: Secondary | ICD-10-CM | POA: Diagnosis not present

## 2023-03-09 MED ORDER — DOXYCYCLINE HYCLATE 100 MG PO TABS
100.0000 mg | ORAL_TABLET | Freq: Once | ORAL | Status: AC
  Administered 2023-03-09: 100 mg via ORAL
  Filled 2023-03-09: qty 1

## 2023-03-09 MED ORDER — DOXYCYCLINE HYCLATE 100 MG PO CAPS: 100.0000 mg | ORAL_CAPSULE | Freq: Two times a day (BID) | ORAL | 0 refills | Status: DC

## 2023-03-09 NOTE — ED Provider Notes (Signed)
[place] EMERGENCY DEPARTMENT AT [facility] Provider Note   CSN: [id number] Arrival date & time: 03/08/23  2052     History  Chief Complaint  Patient presents with   Groin Pain    Sharon Cain is a 67 y.o. female.  The history is provided by the patient.  Groin Pain This is a new problem. The current episode started more than 2 days ago. The problem occurs constantly. The problem has not changed since onset.Pertinent negatives include no chest pain, no abdominal pain, no headaches and no shortness of breath. Nothing aggravates the symptoms. Nothing relieves the symptoms. She has tried nothing for the symptoms. The treatment provided no relief.       Home Medications Prior to Admission medications   Medication Sig Start Date End Date Taking? Authorizing Provider  doxycycline (VIBRAMYCIN) 100 MG capsule Take 1 capsule (100 mg total) by mouth 2 (two) times daily. 03/09/23  Yes Mariafernanda Hendricksen, MD  albuterol (VENTOLIN HFA) 108 (90 Base) MCG/ACT inhaler Inhale 2 puffs into the lungs every 6 (six) hours as needed for wheezing or shortness of breath. 03/05/22   Omar Person, MD  apixaban (ELIQUIS) 5 MG TABS tablet Take 1 tablet (5 mg total) by mouth 2 (two) times daily. 05/20/22   Wendall Stade, MD  atorvastatin (LIPITOR) 80 MG tablet Take 0.5 tablets (40 mg total) by mouth daily. 09/20/19   Melene Plan, MD  Black Cohosh 200 MG CAPS Take 1 capsule by mouth daily.    [provider]  busPIRone (BUSPAR) 10 MG tablet Take 10 mg by mouth 3 (three) times daily as needed.    [provider]  calcium carbonate (OS-CAL) 600 MG TABS tablet Take 600 mg by mouth daily.    [provider]  carvedilol (COREG) 12.5 MG tablet Take 1 tablet by mouth twice daily 01/27/23   Wendall Stade, MD  escitalopram (LEXAPRO) 20 MG tablet Take 20 mg by mouth daily.    [provider]  ezetimibe (ZETIA) 10 MG tablet Take 10 mg by mouth daily. 03/04/22    [provider]  furosemide (LASIX) 20 MG tablet TAKE ONE TABLET BY MOUTH DAILY. TAKE ONE TABLET BY MOUTH TWICE DAILY FOR 3 DAYS THEN TAKE ONE TABLET BY MOUTH DAILY. 01/27/23   Wendall Stade, MD  levothyroxine (SYNTHROID) 25 MCG tablet Take 25 mcg by mouth daily. 09/07/20   [provider]  meclizine (ANTIVERT) 12.5 MG tablet Take 1 tablet by mouth as needed for dizziness. 06/30/18   [provider]  Melatonin 10 MG TABS Take 20 mg by mouth at bedtime as needed.    [provider]  montelukast (SINGULAIR) 10 MG tablet Take 10 mg by mouth at bedtime. 11/02/21   [provider]  ondansetron (ZOFRAN ODT) 4 MG disintegrating tablet Take 1 tablet (4 mg total) by mouth every 8 (eight) hours as needed for nausea or vomiting. 07/27/16   Ward, Layla Maw, DO  oxybutynin (DITROPAN) 5 MG tablet Take 5 mg by mouth daily.    [provider]  potassium chloride (KLOR-CON) 10 MEQ tablet Take 1 tablet by mouth once daily 08/01/22   Wendall Stade, MD  tirzepatide [facility]) 2.5 MG/0.5ML Pen Inject 2.5 mg into the skin once a week. Pt states she will start 5mg  next Tues    [provider]  umeclidinium-vilanterol (ANORO ELLIPTA) 62.5-25 MCG/ACT AEPB Inhale 1 puff into the lungs daily. 02/07/22   Felisa Bonier  M, MD      Allergies    Sulfa antibiotics, Amlodipine, Lisinopril, and Ramipril    Review of Systems   Review of Systems  Constitutional:  Negative for fever.  HENT:  Negative for facial swelling.   Eyes:  Negative for redness.  Respiratory:  Negative for shortness of breath.   Cardiovascular:  Negative for chest pain.  Gastrointestinal:  Negative for abdominal pain.  Neurological:  Negative for headaches.  All other systems reviewed and are negative.   Physical Exam Updated Vital Signs BP 109/64 (BP Location: Left Arm)   Pulse (!) 54   Temp 98.1 F (36.7 C) (Oral)   Resp 19   Ht 4\' 11"  (1.499 m)   Wt 89.1 kg   SpO2 94%   BMI 39.67  kg/m  Physical Exam Vitals and nursing note reviewed. Exam conducted with a chaperone present.  Constitutional:      General: She is not in acute distress.    Appearance: She is well-developed.  HENT:     Head: Normocephalic and atraumatic.  Eyes:     Pupils: Pupils are equal, round, and reactive to light.  Cardiovascular:     Rate and Rhythm: Normal rate and regular rhythm.     Pulses: Normal pulses.     Heart sounds: Normal heart sounds.  Pulmonary:     Effort: Pulmonary effort is normal. No respiratory distress.     Breath sounds: Normal breath sounds.  Abdominal:     General: Bowel sounds are normal. There is no distension.     Palpations: Abdomen is soft.     Tenderness: There is no abdominal tenderness. There is no guarding or rebound.  Genitourinary:    Comments: Ecchymosis of the left vulva. [place] with nurse present, solid on [place].  No abscess  Musculoskeletal:        General: Normal range of motion.     Cervical back: Neck supple.  Skin:    General: Skin is dry.     Capillary Refill: Capillary refill takes less than 2 seconds.     Findings: No erythema or rash.  Neurological:     General: No focal deficit present.     Deep Tendon Reflexes: Reflexes normal.  Psychiatric:        Mood and Affect: Mood normal.     ED Results / Procedures / Treatments   Labs (all labs ordered are listed, but only abnormal results are displayed) Labs Reviewed - No data to display  EKG None  Radiology No results found.  Procedures Procedures    Medications Ordered in ED Medications  oxyCODONE-acetaminophen (PERCOCET/ROXICET) 5-325 MG per tablet 1 tablet (1 tablet Oral Given 03/08/23 2215)  doxycycline (VIBRA-TABS) tablet 100 mg (100 mg Oral Given 03/09/23 0131)    ED Course/ Medical Decision Making/ A&P                                 Medical Decision Making Patient reports abscess to the groin this week   Amount and/or Complexity of Data Reviewed External Data  Reviewed: notes.    Details: Previous notes reviewed   Risk Prescription drug management. Risk Details: This is clearly a hematoma and not an abscess.  I will cover with antibiotics to prevent infection.  Ice the area. Follow up with GYN.  Stable for discharge     Final Clinical Impression(s) / ED Diagnoses Final diagnoses:  Ecchymosis  Hematoma  Return for intractable cough, coughing up blood, fevers > 100.4 unrelieved by medication, shortness of breath, intractable vomiting, chest pain, shortness of breath, weakness, numbness, changes in speech, facial asymmetry, abdominal pain, passing out, Inability to tolerate liquids or food, cough, altered mental status or any concerns. No signs of systemic illness or infection. The patient is nontoxic-appearing on exam and vital signs are within normal limits.  I have reviewed the triage vital signs and the nursing notes. Pertinent labs & imaging results that were available during my care of the patient were reviewed by me and considered in my medical decision making (see chart for details). After history, exam, and medical workup I feel the patient has been appropriately medically screened and is safe for discharge home. Pertinent diagnoses were discussed with the patient. Patient was given return precautions.    Rx / DC Orders ED Discharge Orders          Ordered    doxycycline (VIBRAMYCIN) 100 MG capsule  2 times daily        03/09/23 0147              Kemiyah Tarazon, MD 03/09/23 0202

## 2023-04-26 ENCOUNTER — Other Ambulatory Visit: Payer: Self-pay | Admitting: Cardiovascular Disease

## 2023-04-26 DIAGNOSIS — I480 Paroxysmal atrial fibrillation: Secondary | ICD-10-CM

## 2023-04-28 NOTE — Telephone Encounter (Signed)
Prescription refill request for Eliquis received. Indication:afib Last office visit:3/24 ZOX:[id number] labs Age: 68 Weight:89.1  kg  Prescription refilled

## 2023-05-14 ENCOUNTER — Other Ambulatory Visit: Payer: Self-pay | Admitting: Cardiovascular Disease

## 2023-06-15 ENCOUNTER — Other Ambulatory Visit: Payer: Self-pay | Admitting: Nurse Practitioner

## 2023-07-01 NOTE — Progress Notes (Signed)
 Evaluation Performed:  Follow-up visit  Date:  07/09/2023   ID:  Sharon Cain, DOB 1956/02/25, MRN [id number]  PCP:  Maryellen Snare, NP  Cardiologist:  [name] Electrophysiologist:  None   Chief Complaint:  DCM  History of Present Illness:    Sharon Cain is a 68 y.o. female with a  istory of PAF, HTN, OSA on CPAP chronic LBBB Non ischemic DCM mild Normal cath 07/30/16 with EF 40-45%. Allergic to ACE and has leg pains with norvasc Maintained on beta blockers and eliquis Activity limited by chronic heel pain and right distal fibula avulsion fracture  No cardiac complaints Compliant with meds  Last echo a year ag 11/13/20 with EF 55-60% mild MR Myovue March 10, 2022 low risk no ischemia EF estimated 42% but noted frequent PVC;s Monitor 02/28/22 with low burden PAF and 9.4% PVC burden   Her coreg dose was increased by PA Mg/K were normal 03-10-22   Her husband died 2025-04-15had MI and was on dialysis . Mom passed November 2024 Has brother living with her and 3 children nearby  She use to work at [employer]  Has some insomnia and fatty liver with previous pancreatitis post GB removal US  only showed hepatic steatosis     Past Medical History:  Diagnosis Date   A-fib [facility]) 2017   Anxiety    Arthritis    Chest pain, central 11/15/2010   Patient admitted to hospital for cardiac rule-out. No MI.     Depression    DEPRESSION 04/02/2007   Qualifier: Diagnosis of  By: Lamona Pilon MD, Jessica     Hyperlipidemia    Hypertension    Insomnia    Obesity    Tobacco abuse 03/30/2007   Qualifier: Diagnosis of  By: Lugene Sahara MD, Beth Brooke     Past Surgical History:  Procedure Laterality Date   CARPAL TUNNEL RELEASE Bilateral    bilateral   CHOLECYSTECTOMY     removed before the age of 21   RIGHT/LEFT HEART CATH AND CORONARY ANGIOGRAPHY N/A 07/30/2016   Procedure: Right/Left Heart Cath and Coronary Angiography;  Surgeon: Arty Binning, MD;  Location: [facility] INVASIVE CV LAB;  Service: Cardiovascular;   Laterality: N/A;     Current Meds  Medication Sig   albuterol (VENTOLIN HFA) 108 (90 Base) MCG/ACT inhaler Inhale 2 puffs into the lungs every 6 (six) hours as needed for wheezing or shortness of breath.   atorvastatin (LIPITOR) 80 MG tablet Take 0.5 tablets (40 mg total) by mouth daily.   Black Cohosh 200 MG CAPS Take 1 capsule by mouth daily.   busPIRone (BUSPAR) 10 MG tablet Take 10 mg by mouth 3 (three) times daily as needed.   calcium carbonate (OS-CAL) 600 MG TABS tablet Take 600 mg by mouth daily.   carvedilol (COREG) 12.5 MG tablet Take 1 tablet (12.5 mg total) by mouth 2 (two) times daily. Please keep scheduled appointment for future refills. Thank you.   ELIQUIS 5 MG TABS tablet Take 1 tablet by mouth twice daily   escitalopram (LEXAPRO) 20 MG tablet Take 20 mg by mouth daily.   ezetimibe (ZETIA) 10 MG tablet Take 10 mg by mouth daily.   furosemide (LASIX) 20 MG tablet TAKE ONE TABLET BY MOUTH DAILY. TAKE ONE TABLET BY MOUTH TWICE DAILY FOR 3 DAYS THEN TAKE ONE TABLET BY MOUTH DAILY.   levothyroxine (SYNTHROID) 25 MCG tablet Take 25 mcg by mouth daily.   meclizine (ANTIVERT) 12.5 MG tablet Take 1 tablet by mouth  as needed for dizziness.   Melatonin 10 MG TABS Take 20 mg by mouth at bedtime as needed.   montelukast (SINGULAIR) 10 MG tablet Take 10 mg by mouth at bedtime.   ondansetron (ZOFRAN ODT) 4 MG disintegrating tablet Take 1 tablet (4 mg total) by mouth every 8 (eight) hours as needed for nausea or vomiting.   oxybutynin (DITROPAN) 5 MG tablet Take 5 mg by mouth daily.   potassium chloride (KLOR-CON) 10 MEQ tablet Take 1 tablet by mouth once daily   umeclidinium-vilanterol (ANORO ELLIPTA) 62.5-25 MCG/ACT AEPB Inhale 1 puff into the lungs daily.     Allergies:   Sulfa antibiotics, Amlodipine, Lisinopril, and Ramipril   Social History   Tobacco Use   Smoking status: Former    Current packs/day: 0.00    Average packs/day: 1 pack/day for 40.0 years (40.0 ttl pk-yrs)     Types: Cigarettes    Start date: 12/09/1975    Quit date: 12/09/2015    Years since quitting: 7.5    Passive exposure: Past   Smokeless tobacco: Never  Vaping Use   Vaping status: Never Used  Substance Use Topics   Alcohol use: No    Alcohol/week: 0.0 standard drinks of alcohol   Drug use: No     Family Hx: [name] includes Atrial fibrillation in her mother; COPD in her father and mother; Congestive Heart Failure in her father; Diabetes in her mother; Heart failure in her mother; Sudden Cardiac Death in her father.  ROS:   Please see the history of present illness.     All other systems reviewed and are negative.   Prior CV studies:   The following studies were reviewed today:  Cath 07/30/16  Echo 10/08/18  Labs/Other Tests and Data Reviewed:    EKG:   Sinus rhythm rate 54 LBBB chornic 07/28/16  08/27/19 SR rate 60 IVCD chronic T wave changes   Recent Labs: No results found for requested labs within last 365 days.   Recent Lipid Panel Lab Results  Component Value Date/Time   CHOL 107 09/16/2019 01:34 PM   TRIG 181 (H) 09/16/2019 01:34 PM   HDL 33 (L) 09/16/2019 01:34 PM   CHOLHDL 3.2 09/16/2019 01:34 PM   CHOLHDL 3.1 01/14/2016 09:08 AM   LDLCALC 44 09/16/2019 01:34 PM   LDLDIRECT 149 (H) 02/27/2010 08:15 PM    Wt Readings from Last 3 Encounters:  07/09/23 210 lb 9.6 oz (95.5 kg)  03/08/23 196 lb 6.9 oz (89.1 kg)  06/17/22 196 lb 6.4 oz (89.1 kg)     Objective:    Vital Signs:  BP 110/60   Pulse 61   Ht 4\' 11"  (1.499 m)   Wt 210 lb 9.6 oz (95.5 kg)   SpO2 91%   BMI 42.54 kg/m    Affect appropriate Overweight white female  HEENT: poor dentition  Neck supple with no adenopathy JVP normal no bruits no thyromegaly Lungs clear with no wheezing and good diaphragmatic motion Heart:  S1/S2 no murmur, no rub, gallop or click PMI normal Abdomen: benighn, BS positve, no tenderness, no AAA no bruit.  No HSM or HJR Distal pulses intact with no  bruits No edema Neuro non-focal Skin warm and dry No muscular weakness   ASSESSMENT & PLAN:    DCM:  Functional class one improved  EF 55-60% by TTE  11/13/20. Non ischemic myovue 02/13/22 EF likely not accurage 42% due to PVC;s  LBBB chronic no high grade AV block  yearly ECG PAF:  Maintaining NSR continue eliquis and beta blocker Low burden on monitor 02/28/22  Can be precipitated by prednisone Ortho: chronic heal pain new right fibula avulsion fracture   Asthma:  ? Exercise induced Albuterol inhaler called in  PVCls in setting of DCM update TTE EF normal by TTE 2022 and myovue non ischemic 02/13/22 Monitor 02/05/22 4% burden coreg dose increased  COVID-19 Education: The signs and symptoms of COVID-19 were discussed with the patient and how to seek care for testing (follow up with PCP or arrange E-visit).  The importance of social distancing was discussed today.     Medication Adjustments/Labs and Tests Ordered: Current medicines are reviewed at length with the patient today.  Concerns regarding medicines are outlined above.   Tests Ordered:  Echo for DCM  Medication Changes: No orders of the defined types were placed in this encounter.  None  Disposition:  Follow up in a year   Signed, Janelle Mediate, MD  07/09/2023 9:18 AM    [facility]

## 2023-07-09 ENCOUNTER — Encounter: Payer: Self-pay | Admitting: Cardiovascular Disease

## 2023-07-09 ENCOUNTER — Ambulatory Visit: Payer: 59 | Attending: Cardiovascular Disease | Admitting: Cardiovascular Disease

## 2023-07-09 VITALS — BP 110/60 | HR 61 | Ht 59.0 in | Wt 210.6 lb

## 2023-07-09 DIAGNOSIS — I10 Essential (primary) hypertension: Secondary | ICD-10-CM | POA: Diagnosis not present

## 2023-07-09 DIAGNOSIS — I5022 Chronic systolic (congestive) heart failure: Secondary | ICD-10-CM | POA: Diagnosis not present

## 2023-07-09 DIAGNOSIS — I447 Left bundle-branch block, unspecified: Secondary | ICD-10-CM | POA: Diagnosis not present

## 2023-07-09 DIAGNOSIS — I4891 Unspecified atrial fibrillation: Secondary | ICD-10-CM | POA: Diagnosis not present

## 2023-07-09 NOTE — Patient Instructions (Signed)
 Medication Instructions:  Your physician recommends that you continue on your current medications as directed. Please refer to the Current Medication list given to you today.  *If you need a refill on your cardiac medications before your next appointment, please call your pharmacy*  Lab Work: If you have labs (blood work) drawn today and your tests are completely normal, you will receive your results only by: MyChart Message (if you have MyChart) OR A paper copy in the mail If you have any lab test that is abnormal or we need to change your treatment, we will call you to review the results.  Testing/Procedures: Your physician has requested that you have an echocardiogram. Echocardiography is a painless test that uses sound waves to create images of your heart. It provides your doctor with information about the size and shape of your heart and how well your heart's chambers and valves are working. This procedure takes approximately one hour. There are no restrictions for this procedure. Please do NOT wear cologne, perfume, aftershave, or lotions (deodorant is allowed). Please arrive 15 minutes prior to your appointment time.  Please note: We ask at that you not bring children with you during ultrasound (echo/ vascular) testing. Due to room size and safety concerns, children are not allowed in the ultrasound rooms during exams. Our front office staff cannot provide observation of children in our lobby area while testing is being conducted. An adult accompanying a patient to their appointment will only be allowed in the ultrasound room at the discretion of the ultrasound technician under special circumstances. We apologize for any inconvenience.  Follow-Up: At [facility], you and your health needs are our priority.  As part of our continuing mission to provide you with exceptional heart care, our providers are all part of one team.  This team includes your primary Cardiologist (physician)  and Advanced Practice Providers or APPs (Physician Assistants and Nurse Practitioners) who all work together to provide you with the care you need, when you need it.  Your next appointment:   1 year(s)  Provider:   Charlton Haws, MD     We recommend signing up for the patient portal called "MyChart".  Sign up information is provided on this After Visit Summary.  MyChart is used to connect with patients for Virtual Visits (Telemedicine).  Patients are able to view lab/test results, encounter notes, upcoming appointments, etc.  Non-urgent messages can be sent to your provider as well.   To learn more about what you can do with MyChart, go to [email or web address].   Other Instructions       1st Floor: - Lobby - Registration  - Pharmacy  - Lab - Cafe  2nd Floor: - PV Lab - Diagnostic Testing (echo, CT, nuclear med)  3rd Floor: - Vacant  4th Floor: - TCTS (cardiothoracic surgery) - AFib Clinic - Structural Heart Clinic - Vascular Surgery  - Vascular Ultrasound  5th Floor: - HeartCare Cardiology (general and EP) - Clinical Pharmacy for coumadin, hypertension, lipid, weight-loss medications, and med management appointments    Valet parking services will be available as well.

## 2023-07-14 ENCOUNTER — Ambulatory Visit: Admitting: Pulmonary Disease

## 2023-07-14 ENCOUNTER — Ambulatory Visit: Payer: Self-pay

## 2023-07-14 ENCOUNTER — Encounter: Payer: Self-pay | Admitting: Pulmonary Disease

## 2023-07-14 VITALS — BP 138/83 | HR 52 | Ht 59.0 in | Wt 209.0 lb

## 2023-07-14 DIAGNOSIS — J432 Centrilobular emphysema: Secondary | ICD-10-CM

## 2023-07-14 DIAGNOSIS — J439 Emphysema, unspecified: Secondary | ICD-10-CM

## 2023-07-14 DIAGNOSIS — Z87891 Personal history of nicotine dependence: Secondary | ICD-10-CM | POA: Diagnosis not present

## 2023-07-14 MED ORDER — PREDNISONE 10 MG PO TABS: 40.0000 mg | ORAL_TABLET | Freq: Every day | ORAL | 0 refills | Status: AC

## 2023-07-14 NOTE — Telephone Encounter (Signed)
 Pt reports she has been experiencing worsening SOB with exertion x 4 days. Pt denies dizziness, notes her SpO2 drops down to 92% when ambulating. Pt reports she is having to use rescue inhaler more often and does not feel her maintenance inhaler is effective. OV scheduled. This RN educated pt on home care, new-worsening symptoms, when to call back/seek emergent care. Pt verbalized understanding and agrees to plan.   E2C2 Pulmonary Triage - Initial Assessment Questions "Chief Complaint (e.g., cough, sob, wheezing, fever, chills, sweat or additional symptoms) *Go to specific symptom protocol after initial questions. Shortness of Breath  "How long have symptoms been present?" Several days, worsening  Have you tested for COVID or Flu? Note: If not, ask patient if a home test can be taken. If so, instruct patient to call back for positive results. No  MEDICINES:   "Have you used any OTC meds to help with symptoms?" No If yes, ask "What medications?" NA  "Have you used your inhalers/maintenance medication?" Yes If yes, "What medications?" Anoro Albuterol  If inhaler, ask "How many puffs and how often?" Note: Review instructions on medication in the chart. 1 puff every morning 2 puffs twice a day OXYGEN: "Do you wear supplemental oxygen?" No If yes, "How many liters are you supposed to use?" NA  "Do you monitor your oxygen levels?" Yes If yes, "What is your reading (oxygen level) today?" 94%  "What is your usual oxygen saturation reading?"  (Note: Pulmonary O2 sats should be 90% or greater) 98%   Copied from CRM [id number]. Topic: Clinical - Red Word Triage >> Jul 14, 2023 11:31 AM Sharon Cain wrote: Red Word that prompted transfer to Nurse Triage: SOB/ trouble breathing.  O2 meter saying 90-95 Reason for Disposition  [1] MILD difficulty breathing (e.g., minimal/no SOB at rest, SOB with walking, pulse <100) AND [2] NEW-onset or WORSE than normal  Answer Assessment - Initial Assessment  Questions 1. RESPIRATORY STATUS: "Describe your breathing?" (e.g., wheezing, shortness of breath, unable to speak, severe coughing)      Shortness of Breath with exertion 2. ONSET: "When did this breathing problem begin?"      Several days, worsening 3. PATTERN "Does the difficult breathing come and go, or has it been constant since it started?"      With exertion 4. SEVERITY: "How bad is your breathing?" (e.g., mild, moderate, severe)    - MILD: No SOB at rest, mild SOB with walking, speaks normally in sentences, can lie down, no retractions, pulse < 100.    - MODERATE: SOB at rest, SOB with minimal exertion and prefers to sit, cannot lie down flat, speaks in phrases, mild retractions, audible wheezing, pulse 100-120.    - SEVERE: Very SOB at rest, speaks in single words, struggling to breathe, sitting hunched forward, retractions, pulse > 120      Mild-Mod HISTORY: "Do you have any history of lung disease?"  (e.g., pulmonary embolus, asthma, emphysema)     COPD 8. CAUSE: "What do you think is causing the breathing problem?"      Unknown 9. OTHER SYMPTOMS: "Do you have any other symptoms? (e.g., dizziness, runny nose, cough, chest pain, fever)     Runny nose 10. O2 SATURATION MONITOR:  "Do you use an oxygen saturation monitor (pulse oximeter) at home?" If Yes, ask: "What is your reading (oxygen level) today?" "What is your usual oxygen saturation reading?" (e.g., 95%)       94%  Protocols used: Breathing Difficulty-A-AH

## 2023-07-14 NOTE — Patient Instructions (Signed)
 VISIT SUMMARY:  Sharon Cain, a 68 year old female with emphysema, visited due to worsening shortness of breath over the past week. She experiences increased difficulty breathing, especially with movement, and has been using her inhalers more frequently. She has a history of smoking but quit in 2017. There are no signs of infection such as fever or productive cough.  YOUR PLAN:  -EMPHYSEMA WITH EXACERBATION: Emphysema is a lung condition that causes shortness of breath due to damaged air sacs. Your current symptoms indicate an exacerbation, meaning your condition has worsened. To help manage this, you will take prednisone  for 4 days, which is an anti-inflammatory medication that can reduce your symptoms. We will also schedule a follow-up appointment to reassess your condition.  INSTRUCTIONS:  Please take the prescribed prednisone  for 4 days as directed. Schedule a follow-up appointment to reassess your condition.

## 2023-07-14 NOTE — Progress Notes (Signed)
 Sharon Cain    [id number]    08-16-1955  Primary Care Physician:Stowe, Levell Reach, NP  Referring Physician: Maryellen Snare, NP [address] [place],  [place] [address]  Chief complaint: Acute visit for dyspnea  HPI: 68 y.o. who  has a past medical history of A-fib (HCC) (2017), Anxiety, Arthritis, Chest pain, central (11/15/2010), Depression, DEPRESSION (04/02/2007), Hyperlipidemia, Hypertension, Insomnia, Obesity, and Tobacco abuse (03/30/2007).   Discussed the use of AI scribe software for clinical note transcription with the patient, who gave verbal consent to proceed.  History of Present Illness Yanisa Goodgame is a 68 year old female with emphysema who presents with worsening shortness of breath. She was previously seen by Dr. Jenny Mohs for chronic respiratory failure, emphysema  Over the past week, she has experienced increased shortness of breath, particularly with exertion. She feels comfortable when sitting but becomes short of breath upon movement.  She has a history of smoking a pack a day for 30 to 35 years, quitting in 2017. She has been using Anoro for the past two to three years, but currently feels it is not effective. She used her albuterol  inhaler twice yesterday and once today, which provides some relief.  No cough productive of mucus, fevers, or chills. Her brother can hear her wheezing at home, although she does not hear it herself.   Outpatient Encounter Medications as of 07/14/2023  Medication Sig   albuterol  (VENTOLIN  HFA) 108 (90 Base) MCG/ACT inhaler Inhale 2 puffs into the lungs every 6 (six) hours as needed for wheezing or shortness of breath.   atorvastatin  (LIPITOR) 80 MG tablet Take 0.5 tablets (40 mg total) by mouth daily.   Black Cohosh 200 MG CAPS Take 1 capsule by mouth daily.   busPIRone  (BUSPAR ) 10 MG tablet Take 10 mg by mouth 3 (three) times daily as needed.   calcium  carbonate (OS-CAL) 600 MG TABS tablet Take 600 mg by mouth daily.    carvedilol  (COREG ) 12.5 MG tablet Take 1 tablet (12.5 mg total) by mouth 2 (two) times daily. Please keep scheduled appointment for future refills. Thank you.   doxycycline  (VIBRAMYCIN ) 100 MG capsule Take 1 capsule (100 mg total) by mouth 2 (two) times daily.   ELIQUIS  5 MG TABS tablet Take 1 tablet by mouth twice daily   escitalopram (LEXAPRO) 20 MG tablet Take 20 mg by mouth daily.   ezetimibe (ZETIA) 10 MG tablet Take 10 mg by mouth daily.   furosemide  (LASIX ) 20 MG tablet TAKE ONE TABLET BY MOUTH DAILY. TAKE ONE TABLET BY MOUTH TWICE DAILY FOR 3 DAYS THEN TAKE ONE TABLET BY MOUTH DAILY.   levothyroxine (SYNTHROID) 25 MCG tablet Take 25 mcg by mouth daily.   meclizine (ANTIVERT) 12.5 MG tablet Take 1 tablet by mouth as needed for dizziness.   Melatonin 10 MG TABS Take 20 mg by mouth at bedtime as needed.   montelukast (SINGULAIR) 10 MG tablet Take 10 mg by mouth at bedtime.   ondansetron  (ZOFRAN  ODT) 4 MG disintegrating tablet Take 1 tablet (4 mg total) by mouth every 8 (eight) hours as needed for nausea or vomiting.   oxybutynin (DITROPAN) 5 MG tablet Take 5 mg by mouth daily.   potassium chloride  (KLOR-CON ) 10 MEQ tablet Take 1 tablet by mouth once daily   predniSONE  (DELTASONE ) 10 MG tablet Take 4 tablets (40 mg total) by mouth daily with breakfast for 5 days.   tirzepatide (MOUNJARO)  2.5 MG/0.5ML Pen Inject 2.5 mg into the skin once a week. Pt states she will start 5mg  next Tues   umeclidinium-vilanterol (ANORO ELLIPTA ) 62.5-25 MCG/ACT AEPB Inhale 1 puff into the lungs daily.   No facility-administered encounter medications on file as of 07/14/2023.   Physical Exam: Blood pressure 138/83, pulse (!) 52, height 4\' 11"  (1.499 m), weight 209 lb (94.8 kg), SpO2 97%. Gen:      No acute distress HEENT:  EOMI, sclera anicteric Neck:     No masses; no thyromegaly Lungs:    Clear to auscultation bilaterally; normal respiratory effort CV:         Regular rate and rhythm; no murmurs Abd:      +  bowel sounds; soft, non-tender; no palpable masses, no distension Ext:    No edema; adequate peripheral perfusion Skin:      Warm and dry; no rash Neuro: alert and oriented x 3 Psych: normal mood and affect  Data Reviewed: Imaging: 01/09/2023 Centrilobular and paraseptal emphysema, atelectasis in the lingula, tiny pulmonary nodule measuring 5.5 mm.  Stable I have reviewed the images personally.   PFTs: 03/05/2022 FVC 2.11 [80%], FEV1 1.64 [82%], F/F78 Normal spirometry  Labs: Assessment & Plan Emphysema with exacerbation Emphysema exacerbation with worsening dyspnea and wheezing over the past week. No fever, chills, or significant sputum production. Wheezing is audible at home but not prominent on examination. No recent antibiotic use is indicated as there is no productive cough or signs of infection. Prednisone  is considered to manage the exacerbation due to its anti-inflammatory effects, which may help alleviate symptoms.  - Prescribe prednisone  for 5 days to manage exacerbation. - Schedule follow-up appointment to reassess condition.  Recommendations: Prednisone  40 mg a day for 5 days Continue Anoro inhaler  Phyllis Breeze MD [facility] 07/14/2023, 3:48 PM  CC: Maryellen Snare, NP

## 2023-07-15 NOTE — Telephone Encounter (Signed)
 Copied from CRM [id number]. Topic: Clinical - Prescription Issue >> Jul 15, 2023 11:23 AM Ilene Malling wrote: Reason for CRM: Patient [phone] states allergic to Prednisone  had a bad reaction raising her heart beats, has heart arrhythmia and is taking medication for it. Patient asked to put in her chart, allergies to Prednisone . Please advise and call back.   [id number] - [place] ([place]), [place] - [address] [place] ([place]) [place] [address] Phone: [phone] Fax: [phone]  Spoke with patient regarding prior message . Patient stated Dr.Mannam has sent into her pharmacy prednisone  and patient stated last time she taken prednisone  it made her heart race . Advised patient Im going to send this message over to the DOD since Dr.Mannam is off today and patient can not wait until tomorrow.   Dr.Olalere can you please advise .

## 2023-07-22 ENCOUNTER — Other Ambulatory Visit: Payer: Self-pay | Admitting: Cardiovascular Disease

## 2023-07-23 ENCOUNTER — Other Ambulatory Visit: Payer: Self-pay | Admitting: Cardiovascular Disease

## 2023-07-23 DIAGNOSIS — I480 Paroxysmal atrial fibrillation: Secondary | ICD-10-CM

## 2023-07-23 NOTE — Telephone Encounter (Signed)
 Prescription refill request for Eliquis  received. Indication: Afib  Last office visit: 07/09/23 [name])  Scr: 0.67 (02/13/22)  Age: 68 Weight: 94.8kg  Labs overdue. Called PCP and spoke with Rose. Requested labs to faxed over to anticoagulation clinic.

## 2023-07-25 NOTE — Telephone Encounter (Signed)
 Updated labs received from PCP.  Scr 0.65 on 12/05/22.   Appropriate dose. Refill sent.

## 2023-07-25 NOTE — Telephone Encounter (Signed)
 Called PCP to follow-up on requested labs. Another message sent to medical records requesting labs.

## 2023-08-01 ENCOUNTER — Other Ambulatory Visit: Payer: Self-pay | Admitting: Cardiovascular Disease

## 2023-08-12 ENCOUNTER — Ambulatory Visit (HOSPITAL_COMMUNITY)

## 2023-08-31 ENCOUNTER — Other Ambulatory Visit: Payer: Self-pay | Admitting: Cardiovascular Disease

## 2023-09-18 ENCOUNTER — Ambulatory Visit (HOSPITAL_COMMUNITY)
Admission: RE | Admit: 2023-09-18 | Discharge: 2023-09-18 | Disposition: A | Discharge status: Home / Self Care | Source: Ambulatory Visit | Attending: Cardiovascular Disease | Admitting: Cardiovascular Disease

## 2023-09-18 ENCOUNTER — Ambulatory Visit: Payer: Self-pay | Admitting: Cardiovascular Disease

## 2023-09-18 DIAGNOSIS — I447 Left bundle-branch block, unspecified: Secondary | ICD-10-CM

## 2023-09-18 LAB — ECHOCARDIOGRAM COMPLETE: S' Lateral: 3.53 cm

## 2023-09-27 ENCOUNTER — Ambulatory Visit (HOSPITAL_COMMUNITY)
Admission: RE | Admit: 2023-09-27 | Discharge: 2023-09-27 | Disposition: A | Discharge status: Home / Self Care | Source: Ambulatory Visit

## 2023-09-27 ENCOUNTER — Encounter (HOSPITAL_COMMUNITY): Payer: Self-pay

## 2023-09-27 VITALS — BP 114/65 | HR 52 | Temp 98.5°F | Resp 22

## 2023-09-27 DIAGNOSIS — K0889 Other specified disorders of teeth and supporting structures: Secondary | ICD-10-CM

## 2023-09-27 DIAGNOSIS — K047 Periapical abscess without sinus: Secondary | ICD-10-CM

## 2023-09-27 DIAGNOSIS — K029 Dental caries, unspecified: Secondary | ICD-10-CM

## 2023-09-27 HISTORY — DX: Chronic obstructive pulmonary disease, unspecified: J44.9

## 2023-09-27 MED ORDER — AMOXICILLIN-POT CLAVULANATE 875-125 MG PO TABS: 1.0000 | ORAL_TABLET | Freq: Two times a day (BID) | ORAL | 0 refills | Status: DC

## 2023-09-27 NOTE — Discharge Instructions (Signed)
 Starting Augmentin  twice daily for 7 days for dental infection coverage. Take 500 mg of Tylenol  every 6-8 hours as needed for pain.  Do not exceed 4000 mg in a day. Keep your follow-up appointment with your dentist as scheduled on Thursday for further evaluation of this. Follow-up with your primary care provider or return here as needed.

## 2023-09-27 NOTE — ED Triage Notes (Addendum)
 Pt c/o right lower toothache x approx 1 wk. C/O pain radiating into entire right side of face and ear along with nausea. States has dental appt next wk. Denies fevers. Has been taking Tyl.

## 2023-09-27 NOTE — ED Provider Notes (Signed)
 [facility]    CSN: [id number] Arrival date & time: 09/27/23  1042      History   Chief Complaint Chief Complaint  Patient presents with   Dental Pain    Appt 1100    HPI Sharon Cain is a 68 y.o. female.   Patient presents with right lower dental pain that is progressively worsened over the last week.  Patient states that she does have an appointment scheduled with a dentist on this upcoming Thursday, but is concerned for underlying infection related to the pain.  Patient states the pain is not radiating to the right side of her face, and also reports some intermittent nausea related to this.  Denies fever, body aches, chills.  Patient states that she has been taking 1 tablet of Tylenol  once in the morning and once at night.  Patient states that the Tylenol  does help with the pain but then the pain will return.  Patient reports that she has history of periodontitis and has had multiple teeth removed due to this.  The history is provided by the patient and medical records.  Dental Pain   Past Medical History:  Diagnosis Date   A-fib [facility]) 2017   Anxiety    Arthritis    Chest pain, central 11/15/2010   Patient admitted to hospital for cardiac rule-out. No MI.     COPD (chronic obstructive pulmonary disease) (HCC)    Depression    DEPRESSION 04/02/2007   Qualifier: Diagnosis of  By: Cheyenne MD, Jessica     Hyperlipidemia    Hypertension    Insomnia    Obesity    Tobacco abuse 03/30/2007   Qualifier: Diagnosis of  By: Bari MD, [facility]      Patient Active Problem List   Diagnosis Date Noted   Elevated ALT measurement 10/01/2019   BMI 40.0-44.9, adult (HCC) 09/20/2019   Chronic nasal congestion 09/20/2019   Vertigo 09/20/2019   Obstructive sleep apnea treated with continuous positive airway pressure (CPAP) 07/22/2017   Chronic systolic CHF (congestive heart failure) (HCC) 01/15/2016   Hyperglycemia 12/12/2015   Atrial fibrillation (HCC) 12/10/2015    OBESITY 07/26/2009   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      Home Medications    Prior to Admission medications   Medication Sig Start Date End Date Taking? Authorizing Provider  albuterol  (VENTOLIN  HFA) 108 (90 Base) MCG/ACT inhaler Inhale 2 puffs into the lungs every 6 (six) hours as needed for wheezing or shortness of breath. 03/05/22  Yes Gladis Leonor HERO, MD  amoxicillin -clavulanate (AUGMENTIN ) 875-125 MG tablet Take 1 tablet by mouth every 12 (twelve) hours. 09/27/23  Yes Johnie Flaming A, NP  apixaban  (ELIQUIS ) 5 MG TABS tablet Take 1 tablet by mouth twice daily 07/25/23  Yes Nishan, Peter C, MD  atorvastatin  (LIPITOR) 80 MG tablet Take 0.5 tablets (40 mg total) by mouth daily. 09/20/19  Yes Luke Vernell BRAVO, MD  Black Cohosh 200 MG  CAPS Take 1 capsule by mouth daily.   Yes [provider]  busPIRone  (BUSPAR ) 10 MG tablet Take 10 mg by mouth 3 (three) times daily as needed.   Yes [provider]  calcium  carbonate (OS-CAL) 600 MG TABS tablet Take 600 mg by mouth daily.   Yes [provider]  carvedilol  (COREG ) 12.5 MG tablet TAKE 1 TABLET BY MOUTH TWICE DAILY . APPOINTMENT REQUIRED FOR FUTURE REFILLS 09/01/23  Yes Nishan, Peter C, MD  escitalopram (LEXAPRO) 20 MG tablet Take 20 mg by mouth daily.   Yes [provider]  ezetimibe (ZETIA) 10 MG tablet Take 10 mg by mouth daily. 03/04/22  Yes [provider]  fluticasone (FLONASE) 50  MCG/ACT nasal spray Place into both nostrils daily.   Yes [provider]  furosemide  (LASIX ) 20 MG tablet Take 1 tablet (20 mg total) by mouth daily. 08/01/23  Yes Nishan, Peter C, MD  levothyroxine (SYNTHROID) 25 MCG tablet Take 25 mcg by mouth daily. 09/07/20  Yes [provider]  meclizine (ANTIVERT) 12.5 MG tablet Take 1 tablet by mouth as needed for dizziness. 06/30/18  Yes [provider]  Melatonin 10 MG TABS Take 20 mg by mouth at bedtime as needed.   Yes [provider]  montelukast (SINGULAIR) 10 MG tablet Take 10 mg by mouth at bedtime. 11/02/21  Yes [provider]  oxybutynin (DITROPAN) 5 MG tablet Take 5 mg by mouth daily.   Yes [provider]  potassium chloride  (KLOR-CON ) 10 MEQ tablet Take 1 tablet by mouth once daily 07/22/23  Yes Nishan, Peter C, MD  umeclidinium-vilanterol (ANORO ELLIPTA ) 62.5-25 MCG/ACT AEPB Inhale 1 puff into the lungs daily. 02/07/22  Yes Gladis Leonor HERO, MD  ondansetron  (ZOFRAN  ODT) 4 MG disintegrating tablet Take 1 tablet (4 mg total) by mouth every 8 (eight) hours as needed for nausea or vomiting. 07/27/16   Ward, Josette SAILOR, DO    Family History Family History  Problem Relation Age of Onset   Atrial fibrillation Mother    Diabetes Mother    Heart failure Mother    COPD Mother    COPD Father    Congestive Heart Failure Father    Sudden Cardiac Death Father     Social History Social History   Tobacco Use   Smoking status: Former    Current packs/day: 0.00    Average packs/day: 1 pack/day for 40.0 years (40.0 ttl pk-yrs)    Types: Cigarettes    Start date: 12/09/1975    Quit date: 12/09/2015    Years since quitting: 7.8    Passive exposure: Past   Smokeless tobacco: Never  Vaping Use   Vaping status: Never Used  Substance Use Topics   Alcohol use: Yes    Comment: rare   Drug use: No     Allergies   Sulfa  antibiotics, Amlodipine , Lisinopril , and Ramipril   Review of  Systems Review of Systems  Per HPI  Physical Exam Triage Vital Signs ED Triage Vitals  Encounter Vitals Group     BP 09/27/23 1059 114/65     Girls Systolic BP Percentile --      Girls Diastolic BP Percentile --      Boys Systolic BP Percentile --      Boys Diastolic BP Percentile --      Pulse Rate 09/27/23 1059 (!) 52     Resp 09/27/23 1059 (!) 22     Temp 09/27/23 1059 98.5 F (36.9 C)  Temp Source 09/27/23 1059 Oral     SpO2 09/27/23 1059 92 %     Weight --      Height --      Head Circumference --      Peak Flow --      Pain Score 09/27/23 1101 7     Pain Loc --      Pain Education --      Exclude from Growth Chart --    No data found.  Updated Vital Signs BP 114/65   Pulse (!) 52   Temp 98.5 F (36.9 C) (Oral)   Resp (!) 22   SpO2 92% Comment: Reports hx COPD  Visual Acuity Right Eye Distance:   Left Eye Distance:   Bilateral Distance:    Right Eye Near:   Left Eye Near:    Bilateral Near:     Physical Exam Vitals and nursing note reviewed.  Constitutional:      General: She is awake. She is not in acute distress.    Appearance: Normal appearance. She is well-developed and well-groomed. She is not ill-appearing.  HENT:     Mouth/Throat:     Dentition: Abnormal dentition. Does not have dentures. Dental tenderness, gingival swelling and dental caries present.     Comments: Dental caries, chipped teeth, dental tenderness noted to right lower mouth with surrounding gingival swelling Skin:    General: Skin is warm and dry.  Neurological:     Mental Status: She is alert.  Psychiatric:        Behavior: Behavior is cooperative.      UC Treatments / Results  Labs (all labs ordered are listed, but only abnormal results are displayed) Labs Reviewed - No data to display  EKG   Radiology No results found.  Procedures Procedures (including critical care time)  Medications Ordered in UC Medications - No data to display  Initial Impression  / Assessment and Plan / UC Course  I have reviewed the triage vital signs and the nursing notes.  Pertinent labs & imaging results that were available during my care of the patient were reviewed by me and considered in my medical decision making (see chart for details).     Patient is overall well-appearing.  Vitals are stable.  Upon assessment there dental caries, chipped teeth, dental tenderness noted to right lower mouth with surrounding gingival swelling.  Prescribed Augmentin  for underlying dental infection.  Recommended Tylenol  every 6-8 hours as needed for pain.  Discussed follow-up and return precautions. Final Clinical Impressions(s) / UC Diagnoses   Final diagnoses:  Dental caries  Pain, dental  Dental infection     Discharge Instructions      Starting Augmentin  twice daily for 7 days for dental infection coverage. Take 500 mg of Tylenol  every 6-8 hours as needed for pain.  Do not exceed 4000 mg in a day. Keep your follow-up appointment with your dentist as scheduled on Thursday for further evaluation of this. Follow-up with your primary care provider or return here as needed.    ED Prescriptions     Medication Sig Dispense Auth. Provider   amoxicillin -clavulanate (AUGMENTIN ) 875-125 MG tablet Take 1 tablet by mouth every 12 (twelve) hours. 14 tablet Johnie Flaming A, NP      PDMP not reviewed this encounter.   Johnie Flaming A, NP 09/27/23 [phone]

## 2023-10-09 ENCOUNTER — Telehealth: Payer: Self-pay

## 2023-10-09 NOTE — Telephone Encounter (Signed)
   Pre-operative Risk Assessment    Patient Name: Sharon Cain  DOB: 11-14-1955 MRN: [id number]   Date of last office visit: 07/09/23 MAUDE EMMER, MD Date of next office visit: NONE  Request for Surgical Clearance    Procedure:  Dental Extraction - Amount of Teeth to be Pulled:  3 MOLARS INCLUDING IMPACTED WISDOM TOOTH  Date of Surgery:  Clearance TBD                                Surgeon:  DR DEWARD CONGRESS Surgeon's Group or Practice Name:  [facility] Phone number:  [phone] Fax number:  [phone]   Type of Clearance Requested:   - Medical  - Pharmacy:  Hold Apixaban  (Eliquis )     Type of Anesthesia:  LOCAL WITH EPINEPHRINE AND NITROUS OXIDE   Additional requests/questions:    Signed, Lucie DELENA Ku   10/09/2023, 8:55 AM

## 2023-10-14 NOTE — Telephone Encounter (Signed)
   Primary Cardiologist: Maude Emmer, MD  Chart reviewed as part of pre-operative protocol coverage. Given past medical history and time since last visit, based on ACC/AHA guidelines, Sharon Cain would be at acceptable risk for the planned procedure without further cardiovascular testing.   Patient should contact our office if she is having new symptoms that are concerning from a cardiac perspective to arrange a follow-up appointment.   SBE prophylaxis is not indicated for this patient.  Per office protocol, patient can hold Eliquis  for 1 day prior to procedure.   I will route this recommendation to the requesting party via Epic fax function and remove from pre-op  pool.  Please call with questions.  Rosaline EMERSON Bane, NP-C 10/14/2023, 4:07 PM [address], [address] [place], [place] [address] Office [phone] Fax [phone]

## 2023-10-14 NOTE — Telephone Encounter (Signed)
 Patient with diagnosis of afib on Eliquis  for anticoagulation.    Procedure: Dental Extraction - Amount of Teeth to be Pulled:  3 MOLARS INCLUDING IMPACTED WISDOM TOOTH  Date of procedure: TBD   CHA2DS2-VASc Score = 4   This indicates a 4.8% annual risk of stroke. The patient's score is based upon: CHF History: 1 HTN History: 1 Diabetes History: 0 Stroke History: 0 Vascular Disease History: 0 Age Score: 1 Gender Score: 1      CrCl 82 ml/min Platelet count 176  Patient has not had an Afib/aflutter ablation within the last 3 months or DCCV within the last 30 days  Patient does NOT require pre-op  antibiotics for dental procedure.  Per office protocol, patient can hold Eliquis  for 1 day prior to procedure.    **This guidance is not considered finalized until pre-operative APP has relayed final recommendations.**

## 2023-10-15 NOTE — Telephone Encounter (Signed)
 Called patient back she stated she was wondering if we have sent the clearance to the dental office I made her aware clearance was sent yesterday afternoon she voiced understanding and appreciated the call back

## 2023-10-15 NOTE — Telephone Encounter (Signed)
 Patient is following up. She would like to review clearance recommendation(s). Please advise.

## 2023-10-20 ENCOUNTER — Ambulatory Visit (INDEPENDENT_AMBULATORY_CARE_PROVIDER_SITE_OTHER): Admitting: Pulmonary Disease

## 2023-10-20 ENCOUNTER — Encounter: Payer: Self-pay | Admitting: Pulmonary Disease

## 2023-10-20 VITALS — BP 110/82 | HR 56 | Ht 59.0 in | Wt 219.6 lb

## 2023-10-20 DIAGNOSIS — J438 Other emphysema: Secondary | ICD-10-CM

## 2023-10-20 NOTE — Patient Instructions (Addendum)
 VISIT SUMMARY:  Today, we discussed your ongoing breathing difficulties related to emphysema, recent weight gain and stress, and dental pain. Your breathing is currently stable with the use of your inhalers, and we reviewed your plan for managing these issues moving forward.  YOUR PLAN:  -EMPHYSEMA: Emphysema is a lung condition that causes shortness of breath due to damage to the air sacs in the lungs. You should continue using your Anoro inhaler daily and use albuterol  as needed for acute symptoms. Your next chest CT scan is scheduled for January 2026.  -WEIGHT GAIN AND PSYCHOSOCIAL STRESS: Your recent weight gain is likely related to stress eating following the loss of your husband. It's important to continue managing your stress and try to spend more time outside the home, even though it can be challenging.  -DENTAL PAIN: You are experiencing pain from a problematic wisdom tooth, which is awaiting extraction by an oral surgeon. This pain has been limiting your activities, including your efforts to attend the [facility].   INSTRUCTIONS:  Please continue using your Anoro inhaler daily and albuterol  as needed. Ensure you have your annual chest CT scan in January 2026 ordered by primary care. Continue to manage your stress and try to spend more time outside the home. Follow up with your oral surgeon for the extraction of your wisdom tooth.

## 2023-10-20 NOTE — Progress Notes (Signed)
 Sharon Cain    [id number]    1955/07/09  Primary Care Physician:Stowe, Damian, NP  Referring Physician: Campbell Damian, NP [address] [place],  [place] [address]  Chief complaint: Follow up for emphysema  HPI: 68 y.o. who  has a past medical history of A-fib (HCC) (2017), Anxiety, Arthritis, Chest pain, central (11/15/2010), COPD (chronic obstructive pulmonary disease) (HCC), Depression, DEPRESSION (04/02/2007), Hyperlipidemia, Hypertension, Insomnia, Obesity, and Tobacco abuse (03/30/2007).   Discussed the use of AI scribe software for clinical note transcription with the patient, who gave verbal consent to proceed.  History of Present Illness Sharon Cain is a 68 year old female with emphysema who presents with worsening shortness of breath. She was previously seen by Dr. Gladis for chronic respiratory failure, emphysema  She has a history of smoking a pack a day for 30 to 35 years, quitting in 2017. She has been using Anoro for the past two to three years, but currently feels it is not effective. She used her albuterol  inhaler twice yesterday and once today, which provides some relief.  Interim History Sharon Cain is a 68 year old female with emphysema who presents for follow-up of her breathing difficulties.  Dyspnea and emphysema management - Emphysema with ongoing breathing difficulties, worsened by heat exposure - Currently breathing is stable - Increased time spent indoors due to heat-related dyspnea - Attributes some breathing issues to recent weight gain - Current medications include Anoro inhaler and albuterol  - Previously trialed prednisone  but discontinued after a few days due to adverse cardiac effects - Quit smoking in 2017 - Annual chest CT scans, last performed January 2025; next due January 2026  Weight gain and psychosocial stress - Recent weight gain associated with stress eating following the loss of her husband in March due to  myocardial infarction - Actively attempting to manage stress eating and increase time spent outside the home, though finds this challenging  Dental pain - Experiencing pain from a problematic wisdom tooth - Awaiting extraction by oral surgeon - Dental pain has limited participation in activities, including recent efforts to attend the [facility]  Outpatient Encounter Medications as of 10/20/2023  Medication Sig   albuterol  (VENTOLIN  HFA) 108 (90 Base) MCG/ACT inhaler Inhale 2 puffs into the lungs every 6 (six) hours as needed for wheezing or shortness of breath.   amoxicillin -clavulanate (AUGMENTIN ) 875-125 MG tablet Take 1 tablet by mouth every 12 (twelve) hours.   apixaban  (ELIQUIS ) 5 MG TABS tablet Take 1 tablet by mouth twice daily   atorvastatin  (LIPITOR) 80 MG tablet Take 0.5 tablets (40 mg total) by mouth daily.   Black Cohosh 200 MG CAPS Take 1 capsule by mouth daily.   busPIRone  (BUSPAR ) 10 MG tablet Take 10 mg by mouth 3 (three) times daily as needed.   calcium  carbonate (OS-CAL) 600 MG TABS tablet Take 600 mg by mouth daily.   carvedilol  (COREG ) 12.5 MG tablet TAKE 1 TABLET BY MOUTH TWICE DAILY . APPOINTMENT REQUIRED FOR FUTURE REFILLS   cetirizine  (ZYRTEC ) 10 MG tablet Take 1 tablet by mouth daily.   escitalopram (LEXAPRO) 20 MG tablet Take 20 mg by mouth daily.   ezetimibe (ZETIA) 10 MG tablet Take 10 mg by mouth daily.   fluticasone (FLONASE) 50 MCG/ACT nasal spray Place into both nostrils daily.   furosemide  (LASIX ) 20 MG tablet Take 1 tablet (20 mg total) by mouth daily.   glucose blood (ONETOUCH  ULTRA TEST) test strip USE 1 STRIP TO CHECK GLUCOSE ONCE DAILY AND AS NEEDED   levothyroxine (SYNTHROID) 25 MCG tablet Take 25 mcg by mouth daily.   meclizine (ANTIVERT) 12.5 MG tablet Take 1 tablet by mouth as needed for dizziness.   Melatonin 10 MG TABS Take 20 mg by mouth at bedtime as needed.   montelukast (SINGULAIR) 10 MG tablet Take 10 mg by mouth at bedtime.   ondansetron  (ZOFRAN   ODT) 4 MG disintegrating tablet Take 1 tablet (4 mg total) by mouth every 8 (eight) hours as needed for nausea or vomiting.   oxybutynin (DITROPAN) 5 MG tablet Take 5 mg by mouth daily.   potassium chloride  (KLOR-CON ) 10 MEQ tablet Take 1 tablet by mouth once daily   umeclidinium-vilanterol (ANORO ELLIPTA ) 62.5-25 MCG/ACT AEPB Inhale 1 puff into the lungs daily.   pantoprazole (PROTONIX) 40 MG tablet Take 40 mg by mouth daily.   No facility-administered encounter medications on file as of 10/20/2023.   Physical Exam: Blood pressure 110/82, pulse (!) 56, height 4' 11 (1.499 m), weight 219 lb 9.6 oz (99.6 kg), SpO2 93%. Gen:      No acute distress HEENT:  EOMI, sclera anicteric Neck:     No masses; no thyromegaly Lungs:    Clear to auscultation bilaterally; normal respiratory effort CV:         Regular rate and rhythm; no murmurs Abd:      + bowel sounds; soft, non-tender; no palpable masses, no distension Ext:    No edema; adequate peripheral perfusion Neuro: alert and oriented x 3 Psych: normal mood and affect   Data Reviewed: Imaging: CT scan 01/09/2023 Centrilobular and paraseptal emphysema, atelectasis in the lingula, tiny pulmonary nodule measuring 5.5 mm.  Stable I have reviewed the images personally.   PFTs: 03/05/2022 FVC 2.11 [80%], FEV1 1.64 [82%], F/F78 Normal spirometry  Labs: Assessment & Plan Emphysema Chronic emphysema with recent exacerbation due to heat and stress-related weight gain. Breathing difficulties have improved with Anoro inhaler and albuterol . Previous prednisone  trial was discontinued due to adverse cardiac effects. Smoking cessation maintained since 2017. Annual chest CT is up to date with the next scheduled for January 2026.  She is getting these done at her primary care - Continue Anoro inhaler. - Use albuterol  as needed for acute symptoms. - Annual chest CT is due January 2026.  Advised her to ensure that her primary care orders the follow-up  scan  Weight gain Likely contributing to her dyspnea.  Encouraged her to get back to exercising and eating well  Recommendations: Continue Anoro inhaler Annual low-dose screening CT at her primary care office Weight loss Follow-up in 1 year  Lonna Coder MD [facility] 10/20/2023, 11:38 AM  CC: Campbell Reynolds, NP

## 2023-11-03 ENCOUNTER — Other Ambulatory Visit: Payer: Self-pay | Admitting: Pulmonary Disease

## 2023-11-03 MED ORDER — UMECLIDINIUM-VILANTEROL 62.5-25 MCG/ACT IN AEPB
1.0000 | INHALATION_SPRAY | Freq: Every day | RESPIRATORY_TRACT | 11 refills | Status: AC
Start: 2023-11-03 — End: ?

## 2023-11-03 NOTE — Telephone Encounter (Signed)
 Copied from CRM [id number]. Topic: Clinical - Medication Refill >> Nov 03, 2023 11:13 AM Dustin F wrote: Medication: umeclidinium-vilanterol (ANORO ELLIPTA ) 62.5-25 MCG/ACT AEPB   Has the patient contacted their pharmacy? Yes; no refills left on prescription.  (Agent: If no, request that the patient contact the pharmacy for the refill. If patient does not wish to contact the pharmacy document the reason why and proceed with request.) (Agent: If yes, when and what did the pharmacy advise?)  This is the patient's preferred pharmacy:   [id number] - [place] ([place]), [place] - [address]  ([place]) [place] [address] Phone: [phone] Fax: [phone]  Is this the correct pharmacy for this prescription? Yes If no, delete pharmacy and type the correct one.   Has the prescription been filled recently? Yes  Is the patient out of the medication? Yes  Has the patient been seen for an appointment in the last year OR does the patient have an upcoming appointment? Yes  Can we respond through MyChart? Yes  Agent: Please be advised that Rx refills may take up to 3 business days. We ask that you follow-up with your pharmacy.

## 2023-12-23 ENCOUNTER — Telehealth (HOSPITAL_BASED_OUTPATIENT_CLINIC_OR_DEPARTMENT_OTHER): Payer: Self-pay | Admitting: *Deleted

## 2023-12-23 NOTE — Telephone Encounter (Signed)
   Pre-operative Risk Assessment    Patient Name: Sharon Cain  DOB: Oct 16, 1955 MRN: [id number]   Date of last office visit: 07/09/23 DR. NISHAN Date of next office visit: NONE  Request for Surgical Clearance    Procedure:  Dental Extraction - Amount of Teeth to be Pulled:  8 TEETH FOR SIMPLE EXTRACTIONS AT THIS TIME ; POSSIBLY MAY BE SOME SURGICAL BUT WILL NOT KNOW UNTIL DOING THE PROCEDURE  Date of Surgery:  Clearance TBD                                Surgeon:  DR. BLANCHARD AURORA, DMD Surgeon's Group or Practice Name:  [facility] Phone number:  [phone] Fax number:  [phone]   Type of Clearance Requested:   - Medical  - Pharmacy:  Hold Apixaban  (Eliquis )     Type of Anesthesia:  Local  WITH EPI   Additional requests/questions:    Bonney Niels Jest   12/23/2023, 3:24 PM

## 2023-12-23 NOTE — Telephone Encounter (Signed)
   Primary Cardiologist: Maude Emmer, MD  Chart reviewed as part of pre-operative protocol coverage. Given past medical history and time since last visit, based on ACC/AHA guidelines, ABEEHA TWIST would be at acceptable risk for the planned procedure without further cardiovascular testing.   Patient should contact our office if she is having new symptoms that are concerning from a cardiac perspective to arrange a follow-up appointment.    SBE prophylaxis is not required for this patient.  Per office protocol, she may hold Eliquis  for 1 day prior to procedure and should resume as soon as hemodynamically stable postoperatively.  I will route this recommendation to the requesting party via Epic fax function and remove from pre-op  pool.  Please call with questions.  Rosaline EMERSON Bane, NP-C  12/23/2023, 3:37 PM [address] [place], [place] [address] Office [phone] Fax [phone]

## 2024-01-22 ENCOUNTER — Other Ambulatory Visit: Payer: Self-pay | Admitting: Cardiovascular Disease

## 2024-01-22 DIAGNOSIS — I480 Paroxysmal atrial fibrillation: Secondary | ICD-10-CM

## 2024-01-24 ENCOUNTER — Encounter (HOSPITAL_COMMUNITY): Payer: Self-pay

## 2024-01-24 ENCOUNTER — Ambulatory Visit (INDEPENDENT_AMBULATORY_CARE_PROVIDER_SITE_OTHER)

## 2024-01-24 ENCOUNTER — Ambulatory Visit (HOSPITAL_COMMUNITY)
Admission: RE | Admit: 2024-01-24 | Discharge: 2024-01-24 | Disposition: A | Discharge status: Home / Self Care | Payer: Self-pay | Source: Ambulatory Visit | Attending: Family Medicine | Admitting: Family Medicine

## 2024-01-24 VITALS — BP 114/53 | HR 57 | Temp 98.1°F | Resp 16

## 2024-01-24 DIAGNOSIS — S51852A Open bite of left forearm, initial encounter: Secondary | ICD-10-CM

## 2024-01-24 DIAGNOSIS — L03114 Cellulitis of left upper limb: Secondary | ICD-10-CM | POA: Diagnosis not present

## 2024-01-24 DIAGNOSIS — M79632 Pain in left forearm: Secondary | ICD-10-CM

## 2024-01-24 DIAGNOSIS — W540XXA Bitten by dog, initial encounter: Secondary | ICD-10-CM

## 2024-01-24 MED ORDER — AMOXICILLIN-POT CLAVULANATE 875-125 MG PO TABS: 1.0000 | ORAL_TABLET | Freq: Two times a day (BID) | ORAL | 0 refills | Status: DC

## 2024-01-24 MED ORDER — HYDROCODONE-ACETAMINOPHEN 5-325 MG PO TABS: 1.0000 | ORAL_TABLET | Freq: Four times a day (QID) | ORAL | 0 refills | Status: DC | PRN

## 2024-01-24 NOTE — ED Triage Notes (Signed)
 Pt states dog bite to left forearm 2 days ago. States the dog is up to date on hid rabies shots. Puncture marks noted with redness and swelling to  left arm.

## 2024-01-24 NOTE — Discharge Instructions (Signed)
 Be aware, you have been prescribed pain medications that may cause drowsiness. While taking this medication, do not take any other medications containing acetaminophen (Tylenol). Do not combine with alcohol or recreational drugs. Please do not drive, operate heavy machinery, or take part in activities that require making important decisions while on this medication as your judgement may be clouded.

## 2024-01-24 NOTE — ED Provider Notes (Signed)
 [facility]   [id number] 01/24/24 Arrival Time: 1050  ASSESSMENT & PLAN:  1. Left forearm pain   2. Dog bite of left forearm, initial encounter   3. Cellulitis of left upper extremity    I have personally viewed and independently interpreted the imaging studies ordered this visit. L forearm: no bony abnormalities noted.  Begin: Meds ordered this encounter  Medications   amoxicillin -clavulanate (AUGMENTIN ) 875-125 MG tablet    Sig: Take 1 tablet by mouth every 12 (twelve) hours.    Dispense:  20 tablet    Refill:  0   HYDROcodone -acetaminophen  (NORCO/VICODIN) 5-325 MG tablet    Sig: Take 1 tablet by mouth every 6 (six) hours as needed for moderate pain (pain score 4-6) or severe pain (pain score 7-10).    Dispense:  8 tablet    Refill:  0   Wound care discussed.  Recommend:  Follow-up Information     [facility] at [facility].   Specialty: Urgent Care Why: If worsening or failing to improve as anticipated. Contact information: [address] [place]   [id number] [phone]                [place] Controlled Substances Registry consulted for this patient. I feel the risk/benefit ratio today is favorable for proceeding with this prescription for a controlled substance. Medication sedation precautions given.  Reviewed expectations re: course of current medical issues. Questions answered. Outlined signs and symptoms indicating need for more acute intervention. Patient verbalized understanding. After Visit Summary given.  SUBJECTIVE: History from: patient. Sharon Cain is a 68 y.o. female who reports dog bite to left forearm 2 days ago. States the dog is up to date on rabies shots. Puncture marks noted with redness and swelling to left arm. Denies fever. Denies extremity sensation changes or weakness. No tx PTA except for cleaning wound BID.   Past Surgical History:  Procedure Laterality Date   CARPAL TUNNEL RELEASE  Bilateral    bilateral   CHOLECYSTECTOMY     removed before the age of 31   RIGHT/LEFT HEART CATH AND CORONARY ANGIOGRAPHY N/A 07/30/2016   Procedure: Right/Left Heart Cath and Coronary Angiography;  Surgeon: Claudene Victory ORN, MD;  Location: [facility] INVASIVE CV LAB;  Service: Cardiovascular;  Laterality: N/A;      OBJECTIVE:  Vitals:   01/24/24 1144  BP: (!) 114/53  Pulse: (!) 57  Resp: 16  Temp: 98.1 F (36.7 C)  TempSrc: Oral  SpO2: 92%    General appearance: alert; no distress HEENT: [place]; AT Neck: supple with FROM Resp: unlabored respirations Extremities: LUE: warm with well perfused appearance; several puncture wounds over mid forearm with surrounding erythema; hot to touch; no bleeding or drainage CV: brisk extremity capillary refill of LUE; 2+ radial pulse of LUE. Skin: warm and dry; no visible rashes Neurologic: gait normal; normal sensation and strength of LUE Psychological: alert and cooperative; normal mood and affect  Imaging: DG Forearm Left Result Date: 01/24/2024 CLINICAL DATA:  Dog bite left forearm 2 days ago. Redness and swelling near the puncture site. EXAM: LEFT FOREARM - 2 VIEW COMPARISON:  None Available. FINDINGS: Bony structures and joint spaces are normal. No fracture or dislocation mild degenerative change over the first carpometacarpal joint. No radiopaque foreign body within soft tissues. No evidence of air within the soft tissues. IMPRESSION: No acute findings. Electronically Signed   By: Toribio Agreste M.D.   On: 01/24/2024 12:10      Allergies  Allergen Reactions   Sulfa  Antibiotics     Pancreatitis   Amlodipine  Swelling   Lisinopril      Other reaction(s): Other   Ramipril     Other reaction(s): Other   Prednisone  Palpitations    Past Medical History:  Diagnosis Date   A-fib [facility]) 2017   Anxiety    Arthritis    Chest pain, central 11/15/2010   Patient admitted to hospital for cardiac rule-out. No MI.     COPD (chronic obstructive pulmonary  disease) (HCC)    Depression    DEPRESSION 04/02/2007   Qualifier: Diagnosis of  By: Cheyenne MD, Jessica     Hyperlipidemia    Hypertension    Insomnia    Obesity    Tobacco abuse 03/30/2007   Qualifier: Diagnosis of  By: Bari MD, Theodoro     Social History   Socioeconomic History   Marital status: Married    Spouse name: Not on file   Number of children: Not on file   Years of education: Not on file   Highest education level: Not on file  Occupational History   Not on file  Tobacco Use   Smoking status: Former    Current packs/day: 0.00    Average packs/day: 1 pack/day for 40.0 years (40.0 ttl pk-yrs)    Types: Cigarettes    Start date: 12/09/1975    Quit date: 12/09/2015    Years since quitting: 8.1    Passive exposure: Past   Smokeless tobacco: Never  Vaping Use   Vaping status: Never Used  Substance and Sexual Activity   Alcohol use: Yes    Comment: rare   Drug use: No   Sexual activity: Not on file  Other Topics Concern   Not on file  Social History Narrative   Patient does not work.  She recently stopped working to take care of her husband and mother.  She reports a great relief in anxiety since leaving her job.  She currently lives with her husband Sharon Cain, age 56) and her mother Sharon Cain, 42) and five dogs. She owns a car.  She does not have any religious beliefs that would affect her health care.  She does not have an advanced directive.  She reports that her husband Sharon Cain would make decisions for her if she was unable to do so.  She does not exercise regularly.  Patient had previous tobacco use but quit in 2017.  No recreational drug use.  No alcohol use.  Not at risk of having sexually transmitted disease and feels safe in her relationship.  For fun, patient notes and crochets.   Social Drivers of Corporate Investment Banker Strain: Not on file  Food Insecurity: Low Risk  (08/20/2022)   Received from [facility]   Hunger Vital Sign    Within  the past 12 months, you worried that your food would run out before you got money to buy more: Never true    Within the past 12 months, the food you bought just didn't last and you didn't have money to get more. : Never true  Transportation Needs: Not on file (08/20/2022)  Physical Activity: Not on file  Stress: Not on file  Social Connections: Not on file   Family History  Problem Relation Age of Onset   Atrial fibrillation Mother    Diabetes Mother    Heart failure Mother    COPD Mother    COPD Father    Congestive Heart Failure  Father    Sudden Cardiac Death Father    Past Surgical History:  Procedure Laterality Date   CARPAL TUNNEL RELEASE Bilateral    bilateral   CHOLECYSTECTOMY     removed before the age of 88   RIGHT/LEFT HEART CATH AND CORONARY ANGIOGRAPHY N/A 07/30/2016   Procedure: Right/Left Heart Cath and Coronary Angiography;  Surgeon: Claudene Victory ORN, MD;  Location: [facility] INVASIVE CV LAB;  Service: Cardiovascular;  Laterality: N/A;       Rolinda Rogue, MD 01/24/24 1323

## 2024-01-28 NOTE — Telephone Encounter (Signed)
 Contacted patient and let her know of overdue labwork. Patient will come in today or tomorrow

## 2024-01-30 ENCOUNTER — Ambulatory Visit: Payer: Self-pay | Admitting: Cardiovascular Disease

## 2024-01-30 LAB — BASIC METABOLIC PANEL WITH GFR
BUN/Creatinine Ratio: 15 (ref 12–28)
BUN: 10 mg/dL (ref 8–27)
CO2: 24 mmol/L (ref 20–29)
Calcium: 9.7 mg/dL (ref 8.7–10.3)
Chloride: 103 mmol/L (ref 96–106)
Creatinine, Ser: 0.66 mg/dL (ref 0.57–1.00)
Glucose: 105 mg/dL — ABNORMAL HIGH (ref 70–99)
Potassium: 4 mmol/L (ref 3.5–5.2)
Sodium: 140 mmol/L (ref 134–144)
eGFR: 95 mL/min/1.73 (ref >59)

## 2024-01-30 NOTE — Telephone Encounter (Signed)
 Prescription refill request for Eliquis  received. Indication: a fib Last office visit: 07/09/23 Scr: 0.66 epic 01/29/24 Age: 68 Weight: 99kg

## 2024-02-17 ENCOUNTER — Encounter (HOSPITAL_COMMUNITY): Payer: Self-pay | Admitting: Emergency Medicine

## 2024-02-17 ENCOUNTER — Ambulatory Visit (HOSPITAL_COMMUNITY)
Admission: EM | Admit: 2024-02-17 | Discharge: 2024-02-17 | Disposition: A | Discharge status: Home / Self Care | Attending: Family Medicine | Admitting: Family Medicine

## 2024-02-17 ENCOUNTER — Other Ambulatory Visit: Payer: Self-pay

## 2024-02-17 DIAGNOSIS — N3090 Cystitis, unspecified without hematuria: Secondary | ICD-10-CM | POA: Insufficient documentation

## 2024-02-17 LAB — POCT URINALYSIS DIP (MANUAL ENTRY)
Glucose, UA: NEGATIVE mg/dL
Ketones, POC UA: NEGATIVE mg/dL
Nitrite, UA: NEGATIVE
Protein Ur, POC: >=300 mg/dL — AB
Spec Grav, UA: >=1.03 — AB (ref 1.010–1.025)
Urobilinogen, UA: 1 U/dL
pH, UA: 5.5 (ref 5.0–8.0)

## 2024-02-17 MED ORDER — CEPHALEXIN 500 MG PO CAPS: 500.0000 mg | ORAL_CAPSULE | Freq: Two times a day (BID) | ORAL | 0 refills | Status: AC

## 2024-02-17 NOTE — Discharge Instructions (Signed)
You have had labs (a urine culture) sent today. We will call you with any significant abnormalities or if there is need to begin or change treatment or pursue further follow up.  You may also review your test results online through MyChart. If you do not have a MyChart account, instructions to sign up should be on your discharge paperwork.

## 2024-02-17 NOTE — ED Provider Notes (Signed)
 [facility]    ASSESSMENT & PLAN:  1. Cystitis    Begin: Meds ordered this encounter  Medications   cephALEXin  (KEFLEX ) 500 MG capsule    Sig: Take 1 capsule (500 mg total) by mouth 2 (two) times daily.    Dispense:  10 capsule    Refill:  0   No signs of pyelonephritis. Urine culture sent. Will notify patient of any significant results. Ensure proper hydration. Will follow up with her PCP or here if not showing improvement over the next 48 hours, sooner if needed.  Outlined signs and symptoms indicating need for more acute intervention. Patient verbalized understanding. After Visit Summary given.  SUBJECTIVE:  Sharon Cain is a 68 y.o. female who complains of urinary frequency, urgency and dysuria for the past few hours. Without associated flank pain, fever, chills, vaginal discharge or bleeding. Gross hematuria: present. No specific aggravating or alleviating factors reported. No LE edema. Normal PO intake without n/v/d. Just finished Rx Macrobid 48 hours ago. LMP: No LMP recorded. Patient is postmenopausal.  OBJECTIVE:  Vitals:   02/17/24 1120  BP: 102/64  Pulse: 67  Resp: (!) 22  Temp: 99.3 F (37.4 C)  TempSrc: Oral  SpO2: 94%   General appearance: alert; no distress HENT: oropharynx: moist Lungs: unlabored respirations Abdomen: soft, non-tender; bowel sounds normal; no masses or organomegaly; no guarding or rebound tenderness Back: no CVA tenderness Extremities: no edema; symmetrical with no gross deformities Skin: warm and dry Neurologic: normal gait Psychological: alert and cooperative; normal mood and affect  Labs Reviewed  POCT URINALYSIS DIP (MANUAL ENTRY) - Abnormal; Notable for the following components:      Result Value   Color, UA straw (*)    Clarity, UA cloudy (*)    Bilirubin, UA small (*)    Spec Grav, UA >=1.030 (*)    Blood, UA large (*)    Protein Ur, POC >=300 (*)    Leukocytes, UA Moderate (2+) (*)    All other  components within normal limits  URINE CULTURE    Allergies  Allergen Reactions   Sulfa  Antibiotics     Pancreatitis   Amlodipine  Swelling   Lisinopril      Other reaction(s): Other   Ramipril     Other reaction(s): Other   Prednisone  Palpitations    Past Medical History:  Diagnosis Date   A-fib [facility]) 2017   Anxiety    Arthritis    Chest pain, central 11/15/2010   Patient admitted to hospital for cardiac rule-out. No MI.     COPD (chronic obstructive pulmonary disease) (HCC)    Depression    DEPRESSION 04/02/2007   Qualifier: Diagnosis of  By: Cheyenne MD, Jessica     Hyperlipidemia    Hypertension    Insomnia    Obesity    Tobacco abuse 03/30/2007   Qualifier: Diagnosis of  By: Bari MD, Theodoro     Social History   Socioeconomic History   Marital status: Married    Spouse name: Not on file   Number of children: Not on file   Years of education: Not on file   Highest education level: Not on file  Occupational History   Not on file  Tobacco Use   Smoking status: Former    Current packs/day: 0.00    Average packs/day: 1 pack/day for 40.0 years (40.0 ttl pk-yrs)    Types: Cigarettes    Start date: 12/09/1975    Quit date: 12/09/2015  Years since quitting: 8.1    Passive exposure: Past   Smokeless tobacco: Never  Vaping Use   Vaping status: Never Used  Substance and Sexual Activity   Alcohol use: Yes    Comment: rare   Drug use: No   Sexual activity: Not on file  Other Topics Concern   Not on file  Social History Narrative   Patient does not work.  She recently stopped working to take care of her husband and mother.  She reports a great relief in anxiety since leaving her job.  She currently lives with her husband [name], age 78) and her mother [name], 85) and five dogs. She owns a car.  She does not have any religious beliefs that would affect her health care.  She does not have an advanced directive.  She reports that her husband Ohanna Gassert  would make decisions for her if she was unable to do so.  She does not exercise regularly.  Patient had previous tobacco use but quit in 2017.  No recreational drug use.  No alcohol use.  Not at risk of having sexually transmitted disease and feels safe in her relationship.  For fun, patient notes and crochets.   Social Drivers of Corporate Investment Banker Strain: Not on file  Food Insecurity: Low Risk  (08/20/2022)   Received from [facility]   Hunger Vital Sign    Within the past 12 months, you worried that your food would run out before you got money to buy more: Never true    Within the past 12 months, the food you bought just didn't last and you didn't have money to get more. : Never true  Transportation Needs: No Transportation Needs (08/20/2022)   Received from [facility]    In the past 12 months, has lack of reliable transportation kept you from medical appointments, meetings, work or from getting things needed for daily living? : No  Physical Activity: Not on file  Stress: Not on file  Social Connections: Not on file  Intimate Partner Violence: Not on file   Family History  Problem Relation Age of Onset   Atrial fibrillation Mother    Diabetes Mother    Heart failure Mother    COPD Mother    COPD Father    Congestive Heart Failure Father    Sudden Cardiac Death Father         Rolinda Rogue, MD 02/17/24 [phone]

## 2024-02-17 NOTE — ED Triage Notes (Signed)
 Patient woke around 4 am with extreme urge to urinate, stood at bedside and reports incontinent of urine.  Patient has had more episodes today of burning with urination, only able to have small amounts of urine, right lower back pain. Patient reports having seen blood in urine.  Urine is cloudy and has an odor per patient.   Finished an antibiotic 2 days ago for UTI.  Patient reports jones at  [facility] diagnosed patient with uti

## 2024-02-19 LAB — URINE CULTURE: Culture: >=100000 — AB

## 2024-02-23 ENCOUNTER — Ambulatory Visit (HOSPITAL_COMMUNITY): Payer: Self-pay

## 2024-04-13 ENCOUNTER — Ambulatory Visit: Payer: Self-pay | Admitting: Family Medicine

## 2024-04-27 ENCOUNTER — Other Ambulatory Visit: Payer: Self-pay | Admitting: Cardiovascular Disease

## 2024-04-27 DIAGNOSIS — I480 Paroxysmal atrial fibrillation: Secondary | ICD-10-CM
